# Patient Record
Sex: Male | Born: 1955
Health system: Southern US, Community
[De-identification: ages and names within clinical notes are randomized; demographics above are authoritative.]

## PROBLEM LIST (undated history)

## (undated) DIAGNOSIS — E1159 Type 2 diabetes mellitus with other circulatory complications: Secondary | ICD-10-CM

## (undated) DIAGNOSIS — F1910 Other psychoactive substance abuse, uncomplicated: Secondary | ICD-10-CM

## (undated) DIAGNOSIS — I10 Essential (primary) hypertension: Secondary | ICD-10-CM

## (undated) DIAGNOSIS — IMO0001 Reserved for inherently not codable concepts without codable children: Secondary | ICD-10-CM

## (undated) DIAGNOSIS — E119 Type 2 diabetes mellitus without complications: Secondary | ICD-10-CM

## (undated) DIAGNOSIS — B182 Chronic viral hepatitis C: Secondary | ICD-10-CM

## (undated) DIAGNOSIS — E669 Obesity, unspecified: Secondary | ICD-10-CM

## (undated) DIAGNOSIS — E1169 Type 2 diabetes mellitus with other specified complication: Secondary | ICD-10-CM

## (undated) DIAGNOSIS — I152 Hypertension secondary to endocrine disorders: Secondary | ICD-10-CM

## (undated) DIAGNOSIS — F419 Anxiety disorder, unspecified: Secondary | ICD-10-CM

## (undated) HISTORY — DX: Type 2 diabetes mellitus without complications: E11.9

## (undated) HISTORY — DX: Essential (primary) hypertension: I10

## (undated) HISTORY — DX: Hypertension secondary to endocrine disorders: I15.2

## (undated) HISTORY — DX: Type 2 diabetes mellitus with other specified complication: E11.69

## (undated) HISTORY — DX: Other psychoactive substance abuse, uncomplicated: F19.10

## (undated) HISTORY — DX: Chronic viral hepatitis C: B18.2

## (undated) HISTORY — DX: Reserved for inherently not codable concepts without codable children: IMO0001

## (undated) HISTORY — DX: Type 2 diabetes mellitus with other specified complication: E11.59

## (undated) HISTORY — DX: Anxiety disorder, unspecified: F41.9

## (undated) HISTORY — DX: Obesity, unspecified: E66.9

---

## 1998-07-01 ENCOUNTER — Encounter: Payer: Self-pay | Admitting: *Deleted

## 1998-07-01 ENCOUNTER — Inpatient Hospital Stay (HOSPITAL_COMMUNITY): Admission: EM | Admit: 1998-07-01 | Discharge: 1998-07-02 | Payer: Self-pay | Admitting: Emergency Medicine

## 2004-05-28 ENCOUNTER — Ambulatory Visit: Payer: Self-pay | Admitting: *Deleted

## 2004-07-23 ENCOUNTER — Ambulatory Visit: Payer: Self-pay | Admitting: Family Medicine

## 2004-09-03 ENCOUNTER — Ambulatory Visit: Payer: Self-pay | Admitting: Family Medicine

## 2004-09-07 ENCOUNTER — Ambulatory Visit: Payer: Self-pay | Admitting: Internal Medicine

## 2004-09-21 ENCOUNTER — Ambulatory Visit: Payer: Self-pay | Admitting: Family Medicine

## 2004-09-27 ENCOUNTER — Ambulatory Visit: Payer: Self-pay | Admitting: Internal Medicine

## 2004-10-01 ENCOUNTER — Ambulatory Visit: Payer: Self-pay | Admitting: Family Medicine

## 2004-11-13 ENCOUNTER — Ambulatory Visit: Payer: Self-pay | Admitting: Family Medicine

## 2005-05-01 ENCOUNTER — Ambulatory Visit: Payer: Self-pay | Admitting: Family Medicine

## 2005-05-03 ENCOUNTER — Ambulatory Visit: Payer: Self-pay | Admitting: Family Medicine

## 2005-05-24 ENCOUNTER — Ambulatory Visit: Payer: Self-pay | Admitting: Family Medicine

## 2005-07-09 ENCOUNTER — Ambulatory Visit: Payer: Self-pay | Admitting: Family Medicine

## 2005-07-12 ENCOUNTER — Ambulatory Visit: Payer: Self-pay | Admitting: Family Medicine

## 2005-09-16 ENCOUNTER — Ambulatory Visit: Payer: Self-pay | Admitting: Family Medicine

## 2006-01-30 ENCOUNTER — Ambulatory Visit: Payer: Self-pay | Admitting: Family Medicine

## 2006-08-08 ENCOUNTER — Emergency Department (HOSPITAL_COMMUNITY): Admission: EM | Admit: 2006-08-08 | Discharge: 2006-08-08 | Payer: Self-pay | Admitting: Emergency Medicine

## 2006-08-08 ENCOUNTER — Ambulatory Visit: Payer: Self-pay | Admitting: Family Medicine

## 2006-08-14 ENCOUNTER — Ambulatory Visit: Payer: Self-pay | Admitting: Family Medicine

## 2006-08-27 ENCOUNTER — Ambulatory Visit: Payer: Self-pay | Admitting: Family Medicine

## 2006-10-31 ENCOUNTER — Ambulatory Visit: Payer: Self-pay | Admitting: Family Medicine

## 2007-02-11 DIAGNOSIS — E785 Hyperlipidemia, unspecified: Secondary | ICD-10-CM | POA: Insufficient documentation

## 2007-02-11 DIAGNOSIS — F411 Generalized anxiety disorder: Secondary | ICD-10-CM | POA: Insufficient documentation

## 2007-02-11 DIAGNOSIS — I10 Essential (primary) hypertension: Secondary | ICD-10-CM | POA: Insufficient documentation

## 2007-02-11 DIAGNOSIS — F1010 Alcohol abuse, uncomplicated: Secondary | ICD-10-CM | POA: Insufficient documentation

## 2007-03-05 ENCOUNTER — Ambulatory Visit: Payer: Self-pay | Admitting: Family Medicine

## 2007-05-20 ENCOUNTER — Encounter (INDEPENDENT_AMBULATORY_CARE_PROVIDER_SITE_OTHER): Payer: Self-pay | Admitting: *Deleted

## 2009-12-27 ENCOUNTER — Encounter (INDEPENDENT_AMBULATORY_CARE_PROVIDER_SITE_OTHER): Payer: Self-pay | Admitting: Adult Health

## 2009-12-27 ENCOUNTER — Ambulatory Visit: Payer: Self-pay | Admitting: Family Medicine

## 2009-12-27 LAB — CONVERTED CEMR LAB
ALT: 47 units/L (ref 0–53)
AST: 32 units/L (ref 0–37)
Albumin: 4.2 g/dL (ref 3.5–5.2)
Alkaline Phosphatase: 49 units/L (ref 39–117)
BUN: 12 mg/dL (ref 6–23)
CO2: 23 meq/L (ref 19–32)
Calcium: 9.4 mg/dL (ref 8.4–10.5)
Chloride: 101 meq/L (ref 96–112)
Creatinine, Ser: 0.78 mg/dL (ref 0.40–1.50)
Glucose, Bld: 150 mg/dL — ABNORMAL HIGH (ref 70–99)
Microalb, Ur: 2.26 mg/dL — ABNORMAL HIGH (ref 0.00–1.89)
PSA: 0.66 ng/mL (ref 0.10–4.00)
Potassium: 3.9 meq/L (ref 3.5–5.3)
Sodium: 134 meq/L — ABNORMAL LOW (ref 135–145)
Total Bilirubin: 0.5 mg/dL (ref 0.3–1.2)
Total Protein: 7.3 g/dL (ref 6.0–8.3)

## 2010-02-19 ENCOUNTER — Encounter (INDEPENDENT_AMBULATORY_CARE_PROVIDER_SITE_OTHER): Payer: Self-pay | Admitting: Adult Health

## 2010-02-19 ENCOUNTER — Ambulatory Visit: Payer: Self-pay | Admitting: Internal Medicine

## 2010-02-19 LAB — CONVERTED CEMR LAB
ALT: 74 units/L — ABNORMAL HIGH (ref 0–53)
AST: 57 units/L — ABNORMAL HIGH (ref 0–37)
Albumin: 4.1 g/dL (ref 3.5–5.2)
Alkaline Phosphatase: 56 units/L (ref 39–117)
BUN: 13 mg/dL (ref 6–23)
CO2: 21 meq/L (ref 19–32)
Calcium: 9.5 mg/dL (ref 8.4–10.5)
Chloride: 102 meq/L (ref 96–112)
Cholesterol: 175 mg/dL (ref 0–200)
Creatinine, Ser: 0.83 mg/dL (ref 0.40–1.50)
Glucose, Bld: 126 mg/dL — ABNORMAL HIGH (ref 70–99)
HDL: 64 mg/dL (ref >39)
LDL Cholesterol: 97 mg/dL (ref 0–99)
Potassium: 4.1 meq/L (ref 3.5–5.3)
Sodium: 139 meq/L (ref 135–145)
Total Bilirubin: 0.6 mg/dL (ref 0.3–1.2)
Total CHOL/HDL Ratio: 2.7
Total Protein: 8 g/dL (ref 6.0–8.3)
Triglycerides: 72 mg/dL (ref <150)
VLDL: 14 mg/dL (ref 0–40)

## 2010-03-21 ENCOUNTER — Ambulatory Visit: Payer: Self-pay | Admitting: Internal Medicine

## 2010-07-03 ENCOUNTER — Encounter (INDEPENDENT_AMBULATORY_CARE_PROVIDER_SITE_OTHER): Payer: Self-pay | Admitting: *Deleted

## 2010-07-03 LAB — CONVERTED CEMR LAB
ALT: 84 units/L — ABNORMAL HIGH (ref 0–53)
AST: 58 units/L — ABNORMAL HIGH (ref 0–37)
Albumin: 4.3 g/dL (ref 3.5–5.2)
Alkaline Phosphatase: 53 units/L (ref 39–117)
BUN: 14 mg/dL (ref 6–23)
CO2: 25 meq/L (ref 19–32)
Calcium: 10.1 mg/dL (ref 8.4–10.5)
Chloride: 104 meq/L (ref 96–112)
Creatinine, Ser: 0.83 mg/dL (ref 0.40–1.50)
Glucose, Bld: 104 mg/dL — ABNORMAL HIGH (ref 70–99)
HCV Quantitative: 1170000 intl units/mL — ABNORMAL HIGH (ref <43)
Hgb A1c MFr Bld: 6.8 % — ABNORMAL HIGH (ref <5.7)
Potassium: 3.9 meq/L (ref 3.5–5.3)
Sodium: 139 meq/L (ref 135–145)
Total Bilirubin: 0.4 mg/dL (ref 0.3–1.2)
Total Protein: 7.8 g/dL (ref 6.0–8.3)

## 2012-12-22 ENCOUNTER — Telehealth: Payer: Self-pay | Admitting: Physician Assistant

## 2012-12-22 ENCOUNTER — Ambulatory Visit (INDEPENDENT_AMBULATORY_CARE_PROVIDER_SITE_OTHER): Payer: BC Managed Care – PPO | Admitting: Physician Assistant

## 2012-12-22 VITALS — BP 138/101 | HR 98 | Temp 98.6°F | Resp 18 | Ht 67.25 in | Wt 209.8 lb

## 2012-12-22 DIAGNOSIS — I10 Essential (primary) hypertension: Secondary | ICD-10-CM

## 2012-12-22 DIAGNOSIS — E119 Type 2 diabetes mellitus without complications: Secondary | ICD-10-CM

## 2012-12-22 LAB — POCT GLYCOSYLATED HEMOGLOBIN (HGB A1C): Hemoglobin A1C: 12.5

## 2012-12-22 LAB — GLUCOSE, POCT (MANUAL RESULT ENTRY): POC Glucose: 302 mg/dl — AB (ref 70–99)

## 2012-12-22 MED ORDER — METFORMIN HCL 1000 MG PO TABS: 1000.0000 mg | ORAL_TABLET | Freq: Two times a day (BID) | ORAL | Status: DC

## 2012-12-22 MED ORDER — GLIPIZIDE ER 10 MG PO TB24: 20.0000 mg | ORAL_TABLET | Freq: Every day | ORAL | Status: DC

## 2012-12-22 MED ORDER — AMLODIPINE BESYLATE 5 MG PO TABS: 5.0000 mg | ORAL_TABLET | Freq: Every day | ORAL | Status: DC

## 2012-12-22 MED ORDER — LOSARTAN POTASSIUM-HCTZ 100-25 MG PO TABS: 1.0000 | ORAL_TABLET | Freq: Every day | ORAL | Status: DC

## 2012-12-22 NOTE — Telephone Encounter (Signed)
Pt was seen here this evening, but I forgot to ask if pt needed DM testing supplies.  Pt needs to resume checking home blood sugars.  Please call pt and see if he needs [place] to send more testing supplies for him.  Thank you!

## 2012-12-22 NOTE — Progress Notes (Signed)
Subjective:    Patient ID: Todd Greer, male    DOB: 1956-02-12, 57 y.o.   MRN: [id number]  HPI   Todd Greer is a pleasant 57 yr old male here requesting medication refills.  He was previously a patient at [facility] but would like to establish care here.  This is his first visit here.  He has been out of medication for approximately 2 months now.  DM - has been treated for about 15 yrs, states he was previously well controlled on oral medication only.  Was checking sugars about daily but has been out of test strips.  Previously was seeing FBS 120s-160s.  Thinks his last A1C was 6.2 or 6.3 in June 2013.    HTN - thinks he has been treated for about 15 yrs, checks BP at work, today was very elevated.  At triage BP 138/101. He denies symptoms including HA, CP, SOB.   Review of Systems  Constitutional: Negative for fever and chills.  HENT: Negative.   Respiratory: Negative.   Cardiovascular: Negative.   Gastrointestinal: Negative.   Musculoskeletal: Negative.   Skin: Negative.   Neurological: Negative.        Objective:   Physical Exam  Vitals reviewed. Constitutional: He is oriented to person, place, and time. He appears well-developed and well-nourished. No distress.  HENT:  Head: Normocephalic and atraumatic.  Eyes: Conjunctivae are normal. No scleral icterus.  Cardiovascular: Normal rate, regular rhythm and normal heart sounds.  Exam reveals no gallop and no friction rub.   No murmur heard. Pulses:      Dorsalis pedis pulses are 2+ on the right side, and 2+ on the left side.       Posterior tibial pulses are 2+ on the right side, and 2+ on the left side.  Pulmonary/Chest: Effort normal and breath sounds normal. He has no wheezes. He has no rales.  Abdominal: Soft. There is no tenderness.  Musculoskeletal:  DM foot exam performed, normal  Neurological: He is alert and oriented to person, place, and time.  Skin: Skin is warm and dry.  Psychiatric: He has a normal mood and  affect. His behavior is normal.     Filed Vitals:   12/22/12 1916  BP: 138/101  Pulse: 98  Temp: 98.6 F (37 C)  Resp: 18     Results for orders placed in visit on 12/22/12  POCT GLYCOSYLATED HEMOGLOBIN (HGB A1C)      Result Value Range   Hemoglobin A1C 12.5    GLUCOSE, POCT (MANUAL RESULT ENTRY)      Result Value Range   POC Glucose 302 (*) 70 - 99 mg/dl       Assessment & Plan:  Diabetes - Plan: POCT glycosylated hemoglobin (Hb A1C), POCT glucose (manual entry), glipiZIDE (GLUCOTROL XL) 10 MG 24 hr tablet, metFORMIN (GLUCOPHAGE) 1000 MG tablet  -- Pt with 15 yr hx DM previously well controlled on oral medications.  Previously a pt of [facility], now would like to establish here.  He has been out of medication x 2 months now.  BG 302 and A1C 12.5.  Will resume medications that pt was previously taking.  Encouraged him to return for CPE sometime within the next 3 months to establish care and f/u on DM.  Printed edu materials regarding healthy dietary choices.    Hypertension - Plan: Basic metabolic panel, amLODipine (NORVASC) 5 MG tablet, losartan-hydrochlorothiazide (HYZAAR) 100-25 MG per tablet  -- Pt also with 15 yr hx of HTN.  Off meds now for 2 months.  Asymtomatic.  BMP pending.  Will resume amlodipine and losartan/hctz.  Pt will continue monitoring home BPs.    Discussed the importance of returning for CPE to establish care.  Pt expresses understanding and will RTC sooner if new concerns arise.

## 2012-12-22 NOTE — Patient Instructions (Addendum)
Resume taking your medications as directed.  Make sure you are making healthy dietary choices as well as getting 150 minutes of exercise per week.  Schedule (or walk-in) for a complete physical in the next 3 months so we can make sure you are up to date on health maintenance things and follow up on your diabetes.  Certainly come in sooner if you have concerns.   Diabetes Meal Planning Guide The diabetes meal planning guide is a tool to help you plan your meals and snacks. It is important for people with diabetes to manage their blood glucose (sugar) levels. Choosing the right foods and the right amounts throughout your day will help control your blood glucose. Eating right can even help you improve your blood pressure and reach or maintain a healthy weight. CARBOHYDRATE COUNTING MADE EASY When you eat carbohydrates, they turn to sugar. This raises your blood glucose level. Counting carbohydrates can help you control this level so you feel better. When you plan your meals by counting carbohydrates, you can have more flexibility in what you eat and balance your medicine with your food intake. Carbohydrate counting simply means adding up the total amount of carbohydrate grams in your meals and snacks. Try to eat about the same amount at each meal. Foods with carbohydrates are listed below. Each portion below is 1 carbohydrate serving or 15 grams of carbohydrates. Ask your dietician how many grams of carbohydrates you should eat at each meal or snack. Grains and Starches  1 slice bread.   English muffin or hotdog/hamburger bun.   cup cold cereal (unsweetened).   cup cooked pasta or rice.   cup starchy vegetables (corn, potatoes, peas, beans, winter squash).  1 tortilla (6 inches).   bagel.  1 waffle or pancake (size of a CD).   cup cooked cereal.  4 to 6 small crackers. *Whole grain is recommended. Fruit  1 cup fresh unsweetened berries, melon, papaya, pineapple.  1 small fresh  fruit.   banana or mango.   cup fruit juice (4 oz unsweetened).   cup canned fruit in natural juice or water.  2 tbs dried fruit.  12 to 15 grapes or cherries. Milk and Yogurt  1 cup fat-free or 1% milk.  1 cup soy milk.  6 oz light yogurt with sugar-free sweetener.  6 oz low-fat soy yogurt.  6 oz plain yogurt. Vegetables  1 cup raw or  cup cooked is counted as 0 carbohydrates or a "free" food.  If you eat 3 or more servings at 1 meal, count them as 1 carbohydrate serving. Other Carbohydrates   oz chips or pretzels.   cup ice cream or frozen yogurt.   cup sherbet or sorbet.  2 inch square cake, no frosting.  1 tbs honey, sugar, jam, jelly, or syrup.  2 small cookies.  3 squares of graham crackers.  3 cups popcorn.  6 crackers.  1 cup broth-based soup.  Count 1 cup casserole or other mixed foods as 2 carbohydrate servings.  Foods with less than 20 calories in a serving may be counted as 0 carbohydrates or a "free" food. You may want to purchase a book or computer software that lists the carbohydrate gram counts of different foods. In addition, the nutrition facts panel on the labels of the foods you eat are a good source of this information. The label will tell you how big the serving size is and the total number of carbohydrate grams you will be eating per serving.  Divide this number by 15 to obtain the number of carbohydrate servings in a portion. Remember, 1 carbohydrate serving equals 15 grams of carbohydrate. SERVING SIZES Measuring foods and serving sizes helps you make sure you are getting the right amount of food. The list below tells how big or small some common serving sizes are.  1 oz.........4 stacked dice.  3 oz........Marland KitchenDeck of cards.  1 tsp.......Marland KitchenTip of little finger.  1 tbs......Marland KitchenMarland KitchenThumb.  2 tbs.......Marland KitchenGolf ball.   cup......Marland KitchenHalf of a fist.  1 cup.......Marland KitchenA fist. SAMPLE DIABETES MEAL PLAN Below is a sample meal plan that  includes foods from the grain and starches, dairy, vegetable, fruit, and meat groups. A dietician can individualize a meal plan to fit your calorie needs and tell you the number of servings needed from each food group. However, controlling the total amount of carbohydrates in your meal or snack is more important than making sure you include all of the food groups at every meal. You may interchange carbohydrate containing foods (dairy, starches, and fruits). The meal plan below is an example of a 2000 calorie diet using carbohydrate counting. This meal plan has 17 carbohydrate servings. Breakfast  1 cup oatmeal (2 carb servings).   cup light yogurt (1 carb serving).  1 cup blueberries (1 carb serving).   cup almonds. Snack  1 large apple (2 carb servings).  1 low-fat string cheese stick. Lunch  Chicken breast salad.  1 cup spinach.   cup chopped tomatoes.  2 oz chicken breast, sliced.  2 tbs low-fat [place] dressing.  12 whole-wheat crackers (2 carb servings).  12 to 15 grapes (1 carb serving).  1 cup low-fat milk (1 carb serving). Snack  1 cup carrots.   cup hummus (1 carb serving). Dinner  3 oz broiled salmon.  1 cup brown rice (3 carb servings). Snack  1  cups steamed broccoli (1 carb serving) drizzled with 1 tsp olive oil and lemon juice.  1 cup light pudding (2 carb servings). DIABETES MEAL PLANNING WORKSHEET Your dietician can use this worksheet to help you decide how many servings of foods and what types of foods are right for you.  BREAKFAST Food Group and Servings / Carb Servings Grain/Starches __________________________________ Dairy __________________________________________ Vegetable ______________________________________ Fruit ___________________________________________ Meat __________________________________________ Fat ____________________________________________ LUNCH Food Group and Servings / Carb Servings Grain/Starches  ___________________________________ Dairy ___________________________________________ Fruit ____________________________________________ Meat ___________________________________________ Fat _____________________________________________ Laural [place] Food Group and Servings / Carb Servings Grain/Starches ___________________________________ Dairy ___________________________________________ Fruit ____________________________________________ Meat ___________________________________________ Fat _____________________________________________ SNACKS Food Group and Servings / Carb Servings Grain/Starches ___________________________________ Dairy ___________________________________________ Vegetable _______________________________________ Fruit ____________________________________________ Meat ___________________________________________ Fat _____________________________________________ DAILY TOTALS Starches _________________________ Vegetable ________________________ Fruit ____________________________ Dairy ____________________________ Meat ____________________________ Fat ______________________________ Document Released: 05/16/2005 Document Revised: 11/11/2011 Document Reviewed: 03/27/2009 Exit[employer] Patient Information 2013 [place], [place].

## 2012-12-23 ENCOUNTER — Telehealth: Payer: Self-pay

## 2012-12-23 LAB — BASIC METABOLIC PANEL
BUN: 17 mg/dL (ref 6–23)
CO2: 29 mEq/L (ref 19–32)
Calcium: 10.1 mg/dL (ref 8.4–10.5)
Chloride: 96 mEq/L (ref 96–112)
Creat: 1.04 mg/dL (ref 0.50–1.35)
Glucose, Bld: 305 mg/dL — ABNORMAL HIGH (ref 70–99)
Potassium: 4.4 mEq/L (ref 3.5–5.3)
Sodium: 135 mEq/L (ref 135–145)

## 2012-12-23 MED ORDER — BLOOD GLUCOSE MONITORING SUPPL SUPPLIES MISC: Status: DC

## 2012-12-23 NOTE — Telephone Encounter (Signed)
Spoke to patient and let him know that testing supplies were sent to pharmacy.

## 2012-12-23 NOTE — Telephone Encounter (Signed)
Sent to pharmacy 

## 2012-12-23 NOTE — Telephone Encounter (Signed)
Spoke to patient's wife- he does need testing supplies. Please send to [facility] pharmacy listed.

## 2012-12-23 NOTE — Telephone Encounter (Signed)
Pt is calling about lab results Call back number is [phone]

## 2012-12-23 NOTE — Telephone Encounter (Signed)
Spoke with patient and gave him his lab results. 

## 2013-01-20 ENCOUNTER — Other Ambulatory Visit: Payer: Self-pay

## 2013-01-20 DIAGNOSIS — I10 Essential (primary) hypertension: Secondary | ICD-10-CM

## 2013-01-20 DIAGNOSIS — E119 Type 2 diabetes mellitus without complications: Secondary | ICD-10-CM

## 2013-01-20 MED ORDER — METFORMIN HCL 1000 MG PO TABS: 1000.0000 mg | ORAL_TABLET | Freq: Two times a day (BID) | ORAL | Status: DC

## 2013-01-20 MED ORDER — GLIPIZIDE ER 10 MG PO TB24: 20.0000 mg | ORAL_TABLET | Freq: Every day | ORAL | Status: DC

## 2013-01-20 MED ORDER — AMLODIPINE BESYLATE 5 MG PO TABS: 5.0000 mg | ORAL_TABLET | Freq: Every day | ORAL | Status: DC

## 2013-01-20 MED ORDER — LOSARTAN POTASSIUM-HCTZ 100-25 MG PO TABS: 1.0000 | ORAL_TABLET | Freq: Every day | ORAL | Status: DC

## 2013-02-25 ENCOUNTER — Encounter: Payer: Self-pay | Admitting: Family Medicine

## 2013-02-25 ENCOUNTER — Ambulatory Visit (INDEPENDENT_AMBULATORY_CARE_PROVIDER_SITE_OTHER): Payer: BC Managed Care – PPO | Admitting: Family Medicine

## 2013-02-25 VITALS — BP 120/84 | HR 82 | Temp 98.6°F | Resp 16 | Ht 66.5 in | Wt 217.0 lb

## 2013-02-25 DIAGNOSIS — E119 Type 2 diabetes mellitus without complications: Secondary | ICD-10-CM

## 2013-02-25 DIAGNOSIS — IMO0001 Reserved for inherently not codable concepts without codable children: Secondary | ICD-10-CM

## 2013-02-25 DIAGNOSIS — I10 Essential (primary) hypertension: Secondary | ICD-10-CM

## 2013-02-25 DIAGNOSIS — Z760 Encounter for issue of repeat prescription: Secondary | ICD-10-CM

## 2013-02-25 DIAGNOSIS — Z23 Encounter for immunization: Secondary | ICD-10-CM

## 2013-02-25 LAB — LIPID PANEL
Cholesterol: 169 mg/dL (ref 0–200)
HDL: 63 mg/dL (ref >39)
LDL Cholesterol: 92 mg/dL (ref 0–99)
Total CHOL/HDL Ratio: 2.7 Ratio
Triglycerides: 69 mg/dL (ref <150)
VLDL: 14 mg/dL (ref 0–40)

## 2013-02-25 LAB — POCT GLYCOSYLATED HEMOGLOBIN (HGB A1C): Hemoglobin A1C: 8.1

## 2013-02-25 LAB — TSH: TSH: 2.339 u[IU]/mL (ref 0.350–4.500)

## 2013-02-25 MED ORDER — GLIPIZIDE ER 10 MG PO TB24: 20.0000 mg | ORAL_TABLET | Freq: Every day | ORAL | Status: DC

## 2013-02-25 MED ORDER — AMLODIPINE BESYLATE 5 MG PO TABS: 5.0000 mg | ORAL_TABLET | Freq: Every day | ORAL | Status: DC

## 2013-02-25 MED ORDER — METFORMIN HCL 1000 MG PO TABS: 1000.0000 mg | ORAL_TABLET | Freq: Two times a day (BID) | ORAL | Status: DC

## 2013-02-25 MED ORDER — LOSARTAN POTASSIUM-HCTZ 100-25 MG PO TABS: 1.0000 | ORAL_TABLET | Freq: Every day | ORAL | Status: DC

## 2013-02-25 NOTE — Progress Notes (Signed)
S:  This 57 y.o. AA male is new to [facility]; he was seen at 102 inApril at which time medications were resumed and A1c= 12.5%. Previous healthcare was ta [facility]. FSBS at home= mid-100s to 200. Pt denies hypoglycemia since last visit though he has experienced clammy feeling; he drinks O.J. when that occurs. He is compliant w/ medications. He is not meal planning but tries to avoid conc sweets and sugary drinks. He works full time as a Equities trader.  Patient Active Problem List   Diagnosis Date Noted  . HEPATITIS C 02/11/2007  . DM 02/11/2007  . DYSLIPIDEMIA 02/11/2007  . ANXIETY DISORDER 02/11/2007  . ALCOHOL USE 02/11/2007  . HYPERTENSION, ESSENTIAL NOS 02/11/2007   PMHx, Soc Hx and Fam Hx reviewed.  ROS: As per HPI. Negative for appetite or activity changes, abnormal weight loss, fatigue, CP or tightness, palpitations, abd pain or GI upset, myalgias/ arthralgias, HA, dizziness, weakness, numbness or syncope.  O"  Filed Vitals:   02/25/13 1014  BP: 120/84  Pulse: 82  Temp: 98.6 F (37 C)  Resp: 16   GEN: In NAD; WN,WD. HENT: [place]/AT; EOMI w/ clear conj and muddy sclerae. Otherwise normal. COR: RRR; no edema. LUNGS: Normal resp rate and effort. SKIN: W&D; no rashes. See DM foot check. Foot exam- no deformities, ulcerations, corns or callouses; pulses 2+/=. NEURO: A&O x 3; CNs intact.   Results for orders placed in visit on 02/25/13  POCT GLYCOSYLATED HEMOGLOBIN (HGB A1C)      Result Value Range   Hemoglobin A1C 8.1      A/P: Hypertension - Stable and controlled on current medication.       Plan: amLODipine (NORVASC) 5 MG tablet, losartan-hydrochlorothiazide (HYZAAR) 100-25 MG per tablet, TSH  Diabetes - Much improved with medication compliance and improved nutrition.  Plan: glipiZIDE (GLUCOTROL XL) 10 MG 24 hr tablet, metFORMIN (GLUCOPHAGE) 1000 MG tablet, POCT glycosylated hemoglobin (Hb A1C), Lipid panel  Need for prophylactic vaccination with combined  diphtheria-tetanus-pertussis (DTP) vaccine - Plan: Tdap vaccine greater than or equal to 7yo IM  Meds ordered this encounter  Medications  . amLODipine (NORVASC) 5 MG tablet    Sig: Take 1 tablet (5 mg total) by mouth daily.    Dispense:  90 tablet    Refill:  1            . glipiZIDE (GLUCOTROL XL) 10 MG 24 hr tablet    Sig: Take 2 tablets (20 mg total) by mouth daily.    Dispense:  180 tablet    Refill:  1            . metFORMIN (GLUCOPHAGE) 1000 MG tablet    Sig: Take 1 tablet (1,000 mg total) by mouth 2 (two) times daily with a meal.    Dispense:  180 tablet    Refill:  1            . losartan-hydrochlorothiazide (HYZAAR) 100-25 MG per tablet    Sig: Take 1 tablet by mouth daily.    Dispense:  90 tablet    Refill:  1              RTC in 3 months for CPE.

## 2013-02-27 ENCOUNTER — Encounter: Payer: Self-pay | Admitting: Family Medicine

## 2013-03-30 ENCOUNTER — Ambulatory Visit (INDEPENDENT_AMBULATORY_CARE_PROVIDER_SITE_OTHER): Payer: BC Managed Care – PPO | Admitting: Physician Assistant

## 2013-03-30 VITALS — BP 150/90 | HR 107 | Temp 98.2°F | Resp 18 | Ht 67.0 in | Wt 221.0 lb

## 2013-03-30 DIAGNOSIS — E119 Type 2 diabetes mellitus without complications: Secondary | ICD-10-CM

## 2013-03-30 LAB — GLUCOSE, POCT (MANUAL RESULT ENTRY): POC Glucose: 195 mg/dl — AB (ref 70–99)

## 2013-03-30 NOTE — Progress Notes (Signed)
  Subjective:    Patient ID: Todd Greer, male    DOB: Aug 17, 1956, 57 y.o.   MRN: [id number]  HPI 57 y.o. Male presents to clinic today with concerns about taking too much of his Glipizide.This evening he states that he was in a rush and accidentally took 2 Glipizide this evening about 6:45 pm with dinner. Pt usually takes 2 tablets of Glipizide in the morning along with 1 metformin. He is concerned that his sugar will drop during the night.   Review of Systems  Constitutional: Negative for fever and chills.  Eyes: Negative for visual disturbance.  Respiratory: Negative for chest tightness and shortness of breath.   Cardiovascular: Negative for chest pain.  Gastrointestinal: Negative for nausea, vomiting and abdominal pain.  Genitourinary: Negative for difficulty urinating.  Neurological: Negative for dizziness, facial asymmetry and headaches.  All other systems reviewed and are negative.       Objective:   Physical Exam  Nursing note and vitals reviewed. Constitutional: He is oriented to person, place, and time. He appears well-developed and well-nourished. No distress.  HENT:  Head: Normocephalic and atraumatic.  Right Ear: External ear normal.  Left Ear: External ear normal.  Nose: Nose normal.  Eyes: Conjunctivae are normal.  Neck: Normal range of motion. Neck supple.  Cardiovascular: Normal rate, regular rhythm and normal heart sounds.   Pulmonary/Chest: Effort normal and breath sounds normal.  Musculoskeletal: Normal range of motion.  Neurological: He is alert and oriented to person, place, and time.  Skin: Skin is warm and dry.  Psychiatric: He has a normal mood and affect. His behavior is normal. Judgment and thought content normal.      Results for orders placed in visit on 03/30/13  GLUCOSE, POCT (MANUAL RESULT ENTRY)      Result Value Range   POC Glucose 195 (*) 70 - 99 mg/dl       Assessment & Plan:  Diabetes - Plan: POCT glucose (manual entry).  Patient  took too much of his Glipizide medication this evening. Instructed patient to eat a snack with both carbs and protein before he goes to sleep tonight like peanut butter and crackers. He should check his blood sugar between 11-11:30 before he goes to sleep. Suggested he set alarm for about every 2 hours over night to check his sugar. Told him that If he notices his sugar dropping below ~120 he should eat another small snack. Advised him to have a family member keep an eye out if he starts acting differently. Instructed him that he may resume normal medication in the morning. If he has any concerns or questions told him to please call

## 2013-03-30 NOTE — Patient Instructions (Signed)
Eat a snack with both carbs and protein before you go to sleep tonight like peanut butter and crackers Check you blood sugar between 11-11:30 before you go to sleep Set alarm for about every 2 hours over night to check your sugar If you notice it go below ~120 eat another small snack Have a family member keep an eye out if you start acting differently  You may resume normal medication in the morning If you have any questions please call

## 2013-03-30 NOTE — Progress Notes (Signed)
I have examined this patient along with the student and agree.  

## 2013-05-28 ENCOUNTER — Encounter: Payer: Self-pay | Admitting: Family Medicine

## 2013-05-28 ENCOUNTER — Ambulatory Visit (INDEPENDENT_AMBULATORY_CARE_PROVIDER_SITE_OTHER): Payer: BC Managed Care – PPO | Admitting: Family Medicine

## 2013-05-28 VITALS — BP 120/80 | HR 85 | Temp 99.3°F | Resp 16 | Ht 66.0 in | Wt 221.0 lb

## 2013-05-28 DIAGNOSIS — E119 Type 2 diabetes mellitus without complications: Secondary | ICD-10-CM | POA: Insufficient documentation

## 2013-05-28 DIAGNOSIS — B1710 Acute hepatitis C without hepatic coma: Secondary | ICD-10-CM

## 2013-05-28 DIAGNOSIS — Z23 Encounter for immunization: Secondary | ICD-10-CM

## 2013-05-28 DIAGNOSIS — R9431 Abnormal electrocardiogram [ECG] [EKG]: Secondary | ICD-10-CM

## 2013-05-28 DIAGNOSIS — Z0000 Encounter for general adult medical examination without abnormal findings: Secondary | ICD-10-CM

## 2013-05-28 DIAGNOSIS — IMO0001 Reserved for inherently not codable concepts without codable children: Secondary | ICD-10-CM

## 2013-05-28 DIAGNOSIS — N400 Enlarged prostate without lower urinary tract symptoms: Secondary | ICD-10-CM

## 2013-05-28 DIAGNOSIS — Z1211 Encounter for screening for malignant neoplasm of colon: Secondary | ICD-10-CM

## 2013-05-28 LAB — IFOBT (OCCULT BLOOD): IFOBT: NEGATIVE

## 2013-05-28 LAB — POCT GLYCOSYLATED HEMOGLOBIN (HGB A1C): Hemoglobin A1C: 7.4

## 2013-05-28 LAB — COMPREHENSIVE METABOLIC PANEL
ALT: 87 U/L — ABNORMAL HIGH (ref 0–53)
AST: 66 U/L — ABNORMAL HIGH (ref 0–37)
Albumin: 4.1 g/dL (ref 3.5–5.2)
Alkaline Phosphatase: 56 U/L (ref 39–117)
BUN: 12 mg/dL (ref 6–23)
CO2: 22 mEq/L (ref 19–32)
Calcium: 9.8 mg/dL (ref 8.4–10.5)
Chloride: 103 mEq/L (ref 96–112)
Creat: 0.77 mg/dL (ref 0.50–1.35)
Glucose, Bld: 96 mg/dL (ref 70–99)
Potassium: 3.7 mEq/L (ref 3.5–5.3)
Sodium: 136 mEq/L (ref 135–145)
Total Bilirubin: 0.7 mg/dL (ref 0.3–1.2)
Total Protein: 7.5 g/dL (ref 6.0–8.3)

## 2013-05-28 LAB — POCT URINALYSIS DIPSTICK
Bilirubin, UA: NEGATIVE
Blood, UA: NEGATIVE
Glucose, UA: NEGATIVE
Ketones, UA: NEGATIVE
Leukocytes, UA: NEGATIVE
Nitrite, UA: NEGATIVE
Protein, UA: NEGATIVE
Spec Grav, UA: 1.025
Urobilinogen, UA: 1
pH, UA: 5.5

## 2013-05-28 MED ORDER — TAMSULOSIN HCL 0.4 MG PO CAPS: ORAL_CAPSULE | ORAL | Status: DC

## 2013-05-28 NOTE — Progress Notes (Signed)
Subjective:    Patient ID: Todd Greer, male    DOB: [birth year], 57 y.o.   MRN: [id number]  HPI  This 57 y.o. AA male is here for CPE. He has Type II DM, HTN and Hepatitis C (never referred to a specialist for complete evaluation). FSBS= 100-200; he checks every other day. He reports some SOB and chest tightness with exercise, onset within last few months.  PMHx, Soc Hx, Fam Hx and Problem List reviewed. Medications reconciled.   Review of Systems  Constitutional: Positive for fatigue.  HENT: Negative.   Eyes: Negative.   Respiratory: Positive for shortness of breath.   Cardiovascular: Negative.   Gastrointestinal: Negative.   Endocrine: Negative.   Genitourinary: Positive for urgency and difficulty urinating. Negative for dysuria, hematuria and flank pain.       C/o decreased stream and nocturia.  Musculoskeletal: Positive for arthralgias.  Skin: Negative.   Allergic/Immunologic: Negative.   Neurological: Negative.   Hematological: Negative.   Psychiatric/Behavioral: Negative.        Objective:   Physical Exam  Constitutional: He is oriented to person, place, and time. Vital signs are normal. He appears well-developed and well-nourished. No distress.  HENT:  Head: Normocephalic and atraumatic.  Right Ear: Hearing, external ear and ear canal normal. Tympanic membrane is scarred. Tympanic membrane is not erythematous. No middle ear effusion.  Left Ear: Hearing, external ear and ear canal normal. Tympanic membrane is scarred. Tympanic membrane is not erythematous.  No middle ear effusion.  Nose: Nose normal. No nasal deformity or septal deviation.  Mouth/Throat: Uvula is midline, oropharynx is clear and moist and mucous membranes are normal. He has dentures. No oral lesions.  Eyes: Conjunctivae, EOM and lids are normal. Pupils are equal, round, and reactive to light. No scleral icterus.  Fundoscopic exam:      The right eye shows no papilledema. The right eye shows red reflex.        The left eye shows no papilledema. The left eye shows red reflex.  Fundoscopic exam difficult.  Neck: Normal range of motion and full passive range of motion without pain. Neck supple. No JVD present. No spinous process tenderness and no muscular tenderness present. No mass and no thyromegaly present.  Cardiovascular: Normal rate, regular rhythm, S1 normal, S2 normal and normal pulses.   Occasional extrasystoles are present. PMI is not displaced.  Exam reveals no gallop, no distant heart sounds and no friction rub.   No murmur heard. Pulmonary/Chest: Effort normal and breath sounds normal. No respiratory distress.  Abdominal: Soft. Normal appearance and bowel sounds are normal. He exhibits no distension, no pulsatile midline mass and no mass. There is no hepatosplenomegaly. There is no tenderness. There is no guarding and no CVA tenderness. No hernia.  Genitourinary: Rectal exam shows external hemorrhoid. Rectal exam shows no fissure, no mass, no tenderness and anal tone normal. Guaiac negative stool. Prostate is enlarged. Prostate is not tender.  Musculoskeletal: Normal range of motion. He exhibits no edema and no tenderness.  Neurological: He is alert and oriented to person, place, and time. He has normal reflexes. He displays no atrophy and no tremor. No cranial nerve deficit or sensory deficit. He exhibits normal muscle tone. Coordination and gait normal.  Skin: Skin is warm, dry and intact. No lesion and no rash noted. He is not diaphoretic. No cyanosis. Nails show no clubbing.  Psychiatric: His speech is normal and behavior is normal. Judgment and thought content normal. Cognition and memory are normal.  Results for orders placed in visit on 05/28/13  POCT URINALYSIS DIPSTICK      Result Value Range   Color, UA yellow     Clarity, UA clear     Glucose, UA neg     Bilirubin, UA neg     Ketones, UA neg     Spec Grav, UA 1.025     Blood, UA neg     pH, UA 5.5     Protein, UA neg      Urobilinogen, UA 1.0     Nitrite, UA neg     Leukocytes, UA Negative    IFOBT (OCCULT BLOOD)      Result Value Range   IFOBT Negative    POCT GLYCOSYLATED HEMOGLOBIN (HGB A1C)      Result Value Range   Hemoglobin A1C 7.4      ECG: Sinus rhythm w/ PVCs; RBBB w/ left anterior fascicular block.      Assessment & Plan:  Routine general medical examination at a health care facility - Plan: POCT urinalysis dipstick, IFOBT POC (occult bld, rslt in office), POCT glycosylated hemoglobin (Hb A1C), PSA, Vit D  25 hydroxy (rtn osteoporosis monitoring), Microalbumin, urine, Comprehensive metabolic panel, EKG 12-Lead, CANCELED: Basic metabolic panel  Type II or unspecified type diabetes mellitus without mention of complication, uncontrolled  Prostatism- Advised pt Urology referral may be needed if PSA elevated. Trial Flomax.  HEPATITIS C - Plan: Ambulatory referral to Gastroenterology, Hepatitis C Genotype  Nonspecific abnormal electrocardiogram (ECG) (EKG) - Plan: Ambulatory referral to Cardiology  Need for prophylactic vaccination against Streptococcus pneumoniae (pneumococcus) - Plan: Pneumococcal polysaccharide vaccine 23-valent greater than or equal to 2yo subcutaneous/IM  Need for prophylactic vaccination and inoculation against influenza - Plan: Flu Vaccine QUAD 36+ mos IM  Screening for colorectal cancer - Plan: Ambulatory referral to Gastroenterology  Meds ordered this encounter  Medications  . tamsulosin (FLOMAX) 0.4 MG CAPS capsule    Sig: Take 1 tablet by mouth daily to help bladder symptoms.    Dispense:  30 capsule    Refill:  3

## 2013-05-28 NOTE — Patient Instructions (Addendum)
I have referred you to a GI specialist for colonoscopy and to do further evaluation for Hepatitis C.  You also have an abnormal finding on ECG and I want you to see a Cardiologist.  You will be called about these appointments once they are scheduled.

## 2013-05-29 LAB — MICROALBUMIN, URINE: Microalb, Ur: 2.59 mg/dL — ABNORMAL HIGH (ref 0.00–1.89)

## 2013-05-29 LAB — PSA: PSA: 0.8 ng/mL (ref <=4.00)

## 2013-05-29 LAB — VITAMIN D 25 HYDROXY (VIT D DEFICIENCY, FRACTURES): Vit D, 25-Hydroxy: 38 ng/mL (ref 30–89)

## 2013-06-01 ENCOUNTER — Encounter: Payer: Self-pay | Admitting: Cardiology

## 2013-06-01 ENCOUNTER — Telehealth: Payer: Self-pay | Admitting: Radiology

## 2013-06-01 ENCOUNTER — Ambulatory Visit (INDEPENDENT_AMBULATORY_CARE_PROVIDER_SITE_OTHER): Payer: BC Managed Care – PPO | Admitting: Cardiology

## 2013-06-01 ENCOUNTER — Encounter (HOSPITAL_COMMUNITY): Payer: Self-pay | Admitting: *Deleted

## 2013-06-01 VITALS — BP 144/80 | HR 98 | Ht 67.0 in | Wt 223.8 lb

## 2013-06-01 DIAGNOSIS — R0609 Other forms of dyspnea: Secondary | ICD-10-CM

## 2013-06-01 DIAGNOSIS — R0602 Shortness of breath: Secondary | ICD-10-CM | POA: Insufficient documentation

## 2013-06-01 DIAGNOSIS — R9431 Abnormal electrocardiogram [ECG] [EKG]: Secondary | ICD-10-CM

## 2013-06-01 DIAGNOSIS — R0989 Other specified symptoms and signs involving the circulatory and respiratory systems: Secondary | ICD-10-CM

## 2013-06-01 DIAGNOSIS — E785 Hyperlipidemia, unspecified: Secondary | ICD-10-CM

## 2013-06-01 DIAGNOSIS — R0600 Dyspnea, unspecified: Secondary | ICD-10-CM

## 2013-06-01 DIAGNOSIS — E669 Obesity, unspecified: Secondary | ICD-10-CM | POA: Insufficient documentation

## 2013-06-01 DIAGNOSIS — I10 Essential (primary) hypertension: Secondary | ICD-10-CM

## 2013-06-01 LAB — HEPATITIS C RNA QUANTITATIVE
HCV Quantitative Log: 5.88 {Log} — ABNORMAL HIGH (ref <1.18)
HCV Quantitative: 754000 IU/mL — ABNORMAL HIGH (ref <15)

## 2013-06-01 NOTE — Assessment & Plan Note (Signed)
Bifascicular block with PVCs -- with his cardiac risk factors and symptoms, this is very concerning for potential ischemic changes.  Plan: Exercise Myoview

## 2013-06-01 NOTE — Assessment & Plan Note (Signed)
He was in the process of trying to increase his exercise, and that's when he started noticing his dyspnea.  The hope is that we can determine if there is any cardiac etiology associated with his dyspnea.  Otherwise I would like her to get back to his exercise routine with the addition of some or cardiovascular type exercise.  He also can't be need for dietary modification especially with his diabetes.

## 2013-06-01 NOTE — Telephone Encounter (Signed)
Call from [facility], they need EKG, was going to have it scanned, but apparently our scanning is not functioning properly today. We have put in a ticket for repair. I have faxed the report to [facility]

## 2013-06-01 NOTE — Progress Notes (Signed)
PCP: Dow Adolph, MD  Clinic Note: Chief Complaint  Patient presents with  . Establish Care    Abnormal EKG per Dr Audria Nine. Shortness of breath at rest, fatigue, legs get tired easily, lightheadedness when standing too quickly.  . Labs    Done on Friday    HPI: Todd Greer is a 57 y.o. male with a PMH below who presents today for evaluation of an abnormal ECG just performed in order to evaluate shortness of breath with exertion.  He has cardiac risk factors noted his past medical history.  Interval History: He was seen 4 days ago by his primary physician with complaints of exertional shortness of breath with some chest tightness.  This is going on for about a month now.  He used to drink energy drinks to help him with his exercises in an attempt to try to push through his fatigue.  At her, when this was a long helpful, he did note that he was feeling tired and short of breath the lesion he had in the past.  When he gets really short of breath he does note a little bit tightness in his chest but it is difficult for him to breath.  He denies having any of these symptoms at rest.  He does note occasional indigestion type symptoms when he is lying down and occasionally during his episodes of having a hard time breathing.  He describes this as a burning sensation in his epigastric area.  He just also notes he feels fatigued a lot easier.  The remainder of Cardiovascular ROS: negative for - edema, irregular heartbeat, loss of consciousness, murmur, orthopnea, palpitations, paroxysmal nocturnal dyspnea or rapid heart rate: He does have occasional PND orthopnea, but not routine Additional cardiac review of systems: Lightheadedness -yes when he feels short of breath and when he stands up quickly., dizziness - no, syncope/near-syncope - no; TIA/amaurosis fugax - no Melena - no, but does have occasional blood when he wipes from hemorrhoids.; hematochezia no; hematuria - no; nosebleeds - no;  claudication - he does note that his legs feel somewhat tight , but for now he is more short of breath and the like tightness is an after thought  Past Medical History  Diagnosis Date  . Diabetes mellitus type II, controlled, with no complications   . Hypertension   . Substance abuse   . Anxiety   . Obesity, Class II, BMI 35-39.9, with comorbidity   . Obesity, diabetes, and hypertension syndrome   . Hepatitis C, chronic    Prior Cardiac Evaluation and Past Surgical History: History reviewed. No pertinent past surgical history.  No Known Allergies  Current Outpatient Prescriptions  Medication Sig Dispense Refill  . amLODipine (NORVASC) 5 MG tablet Take 1 tablet (5 mg total) by mouth daily.  90 tablet  1  . aspirin 81 MG tablet Take 81 mg by mouth daily.      . Blood Glucose Monitoring Suppl SUPPLIES MISC Use as directed  100 each  0  . glipiZIDE (GLUCOTROL XL) 10 MG 24 hr tablet Take 2 tablets (20 mg total) by mouth daily.  180 tablet  1  . losartan-hydrochlorothiazide (HYZAAR) 100-25 MG per tablet Take 1 tablet by mouth daily.  90 tablet  1  . metFORMIN (GLUCOPHAGE) 1000 MG tablet Take 1 tablet (1,000 mg total) by mouth 2 (two) times daily with a meal.  180 tablet  1  . tamsulosin (FLOMAX) 0.4 MG CAPS capsule Take 1 tablet by mouth daily to help bladder  symptoms.  30 capsule  3   No current facility-administered medications for this visit.    History   Social History Narrative   His married father of 4.  His married for 28 years.  He does with his wife and daughters.  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   He quit smoking in 1985.  He drinks 3 beers a week.   Prior to the onset of his current symptoms, used to work out with weights for at least 40 minutes a day for 3 times a week.  He usually did light weights for many occasions as opposed to heavy weights.      Contacts: Wife [name]; daughter [name]   family history  includes Cancer in his sister; Cerebral aneurysm in his mother; Diabetes in his father; Hypertension in his father; Stroke in his father.   ROS: A comprehensive Review of Systems - Negative except Pertinent positives noted above. General ROS: positive for  - fatigue  PHYSICAL EXAM BP 144/80  Pulse 98  Ht 5\' 7"  (1.702 m)  Wt 223 lb 12.8 oz (101.515 kg)  BMI 35.04 kg/m2 General appearance: Healthy-appearing, well-nourished well-groomed.  Moderately obese.  Answers questions appropriately.  Normal mood and affect. Neck: no adenopathy, no carotid bruit, no JVD, supple, symmetrical, trachea midline and thyroid not enlarged, symmetric, no tenderness/mass/nodules Lungs: clear to auscultation bilaterally, normal percussion bilaterally and Nonlabored with good air movement Heart: normal apical impulse, regular rate and rhythm, S1: normal, S2: physiologically split, no S3 or S4, no rub and No murmur Abdomen: soft, non-tender; bowel sounds normal; no masses,  no organomegaly and Moderate truncal obesity Extremities: extremities normal, atraumatic, no cyanosis or edema Pulses: 2+ and symmetric Skin: Skin color, texture, turgor normal. No rashes or lesions Neurologic: Alert and oriented X 3, normal strength and tone. Normal symmetric reflexes. Normal coordination and gait HEENT: /AT, EOMI, MMM, anicteric sclera  [id number]:WRUEAVWUJ today: Yes Rate: 98 , Rhythm:  Sinus Rhythm with a frequent PVCs;  Conduction Delay bifascicular block: RBBB, LAFB  Recent Labs: Recently checked by PCP, I have reviewed the results on Epic; lipid panel was not checked on last set;  Panel from June 2014: TC 169, TG 69, HDL 63, LDL 92 --> pending outcome stress test, this is relatively at goal.  ASSESSMENT / PLAN: Exertional dyspnea This is a relatively new onset of symptoms.  He also has that, epigastric discomfort associated with it.  I am quite concerned that this could be an anginal equivalent.  His ECG is also quite  abnormal with bifascicular block which can easily be associated with ischemia.   He has significant risk factors as noted in this PMH and problem list.  Plan: Exercise Myoview;  Would consider beta blocker next visit,  Continue ARB and aspirin Aggressive glycemic and cholesterol control.  Abnormal resting ECG findings - bifascicular block with PVCs Bifascicular block with PVCs -- with his cardiac risk factors and symptoms, this is very concerning for potential ischemic changes.  Plan: Exercise Myoview  Obesity (BMI 30-39.9) He was in the process of trying to increase his exercise, and that's when he started noticing his dyspnea.  The hope is that we can determine if there is any cardiac etiology associated with his dyspnea.  Otherwise I would like her to get back to his exercise routine with the addition of some or cardiovascular type exercise.  He also can't be need for dietary modification especially with his  diabetes.  DYSLIPIDEMIA Next is a very good cholesterol panel for someone who does not have coronary disease, but his affect is his goal LDL would be less than 100, which he already is.  HYPERTENSION, ESSENTIAL NOS An ARB inhibitor, HCTZ long with ostial blocker.  This is great for a diabetic.  His blood pressure however is not well controlled today.  We'll CL he doesn't stress test in the midportion of the stress test.  May need to add a beta blocker.  His resting heart rate is in the 90s.    Orders Placed This Encounter  Procedures  . Myocardial Perfusion Imaging    Standing Status: Future     Number of Occurrences:      Standing Expiration Date: 06/01/2014    Order Specific Question:  Where should this test be performed    Answer:  [facility]    Order Specific Question:  Type of stress    Answer:  Exercise    Order Specific Question:  Patient weight in lbs    Answer:  223  . EKG 12-Lead   No orders of the defined types were placed in this encounter.     Followup: 2-3 weeks  Torsten W. Herbie Baltimore, M.D., M.S. [facility] [address] [place], [place]  [address]  [phone] Pager # [phone]

## 2013-06-01 NOTE — Assessment & Plan Note (Signed)
This is a relatively new onset of symptoms.  He also has that, epigastric discomfort associated with it.  I am quite concerned that this could be an anginal equivalent.  His ECG is also quite abnormal with bifascicular block which can easily be associated with ischemia.   He has significant risk factors as noted in this PMH and problem list.  Plan: Exercise Myoview;  Would consider beta blocker next visit,  Continue ARB and aspirin Aggressive glycemic and cholesterol control.

## 2013-06-01 NOTE — Assessment & Plan Note (Signed)
An ARB inhibitor, HCTZ long with ostial blocker.  This is great for a diabetic.  His blood pressure however is not well controlled today.  We'll CL he doesn't stress test in the midportion of the stress test.  May need to add a beta blocker.  His resting heart rate is in the 90s.

## 2013-06-01 NOTE — Patient Instructions (Signed)
I am a bit concerned about your shortness of breath with exertion, and it is getting worse.  CT is abnormal, which could be indicative of possible coronary (heart artery) disease.  With the having diabetes, you may not have classic chest tightness and pressure symptoms it comes along with coronary disease called angina.  You do have high blood pressure high cholesterol and diabetes which are all risk factors of for coronary disease.  Abdomen to evaluate this with a treadmill nuclear stress test.  I will followup with you after the test is done.  If you have any of these symptoms at rest or with minimal exertion, I would like you to contact us and potentially go to the emergency room if it occurs over the weekend.  Marykay Lex, MD  Your physician wants you to follow-up in: 2 weeks. You will receive a reminder letter in the mail two months in advance. If you don't receive a letter, please call our office to schedule the follow-up appointment.  Your physician has requested that you have en exercise stress myoview. For further information please visit [email or web address] Please follow instruction sheet, as given.

## 2013-06-01 NOTE — Assessment & Plan Note (Signed)
Next is a very good cholesterol panel for someone who does not have coronary disease, but his affect is his goal LDL would be less than 100, which he already is.

## 2013-06-02 HISTORY — PX: OTHER SURGICAL HISTORY: SHX169

## 2013-06-02 LAB — HEPATITIS C GENOTYPE

## 2013-06-03 ENCOUNTER — Ambulatory Visit (HOSPITAL_COMMUNITY)
Admission: RE | Admit: 2013-06-03 | Discharge: 2013-06-03 | Disposition: A | Discharge status: Home / Self Care | Payer: BC Managed Care – PPO | Source: Ambulatory Visit | Attending: Cardiovascular Disease | Admitting: Cardiovascular Disease

## 2013-06-03 ENCOUNTER — Telehealth: Payer: Self-pay | Admitting: *Deleted

## 2013-06-03 ENCOUNTER — Encounter: Payer: Self-pay | Admitting: Family Medicine

## 2013-06-03 DIAGNOSIS — I10 Essential (primary) hypertension: Secondary | ICD-10-CM | POA: Insufficient documentation

## 2013-06-03 DIAGNOSIS — R0989 Other specified symptoms and signs involving the circulatory and respiratory systems: Secondary | ICD-10-CM | POA: Insufficient documentation

## 2013-06-03 DIAGNOSIS — E669 Obesity, unspecified: Secondary | ICD-10-CM | POA: Insufficient documentation

## 2013-06-03 DIAGNOSIS — R0609 Other forms of dyspnea: Secondary | ICD-10-CM | POA: Insufficient documentation

## 2013-06-03 DIAGNOSIS — R0600 Dyspnea, unspecified: Secondary | ICD-10-CM

## 2013-06-03 DIAGNOSIS — E785 Hyperlipidemia, unspecified: Secondary | ICD-10-CM

## 2013-06-03 MED ORDER — TECHNETIUM TC 99M SESTAMIBI GENERIC - CARDIOLITE
31.6000 | Freq: Once | INTRAVENOUS | Status: AC | PRN
  Administered 2013-06-03: 32 via INTRAVENOUS

## 2013-06-03 MED ORDER — TECHNETIUM TC 99M SESTAMIBI GENERIC - CARDIOLITE
10.9000 | Freq: Once | INTRAVENOUS | Status: AC | PRN
  Administered 2013-06-03: 10.9 via INTRAVENOUS

## 2013-06-03 NOTE — Telephone Encounter (Signed)
Spoke to patient. Result given . Verbalized understanding  

## 2013-06-03 NOTE — Procedures (Addendum)
[facility] [address] [place] [address] [phone]  Cardiology Nuclear Med Study  Todd Greer is a 57 y.o. male     MRN : [id number]     DOB: 1955/12/25  Procedure Date: 06/03/2013  Nuclear Med Background Indication for Stress Test:  Evaluation for Ischemia History:  No prior cardiac history Cardiac Risk Factors: History of Smoking, Hypertension, Lipids, NIDDM, Overweight and RBBB  Symptoms:  DOE and Palpitations   Nuclear Pre-Procedure Caffeine/Decaff Intake:  7:00pm NPO After: 5:00am   IV Site: R Hand  IV 0.9% NS with Angio Cath:  22g  Chest Size (in):  46 IV Started by: Koren Shiver, CNMT  Height: 5\' 7"  (1.702 m)  Cup Size: n/a  BMI:  Body mass index is 34.92 kg/(m^2). Weight:  223 lb (101.152 kg)   Tech Comments:  Held Amlodipine 24 hrs prior to test    Nuclear Med Study 1 or 2 day study: 1 day  Stress Test Type:  Stress  Order Authorizing Provider:  Bryan Lemma, MD   Resting Radionuclide: Technetium 99m Sestamibi  Resting Radionuclide Dose: 10.9 mCi   Stress Radionuclide:  Technetium 39m Sestamibi  Stress Radionuclide Dose: 31.6 mCi           Stress Protocol Rest HR: 98 Stress HR: 173  Rest BP: 124/100 Stress BP: 190/108  Exercise Time (min): 8 METS: 10.1   Predicted Max HR: 163 bpm % Max HR: 106.13 bpm Rate Pressure Product: 21308  Dose of Adenosine (mg):  n/a Dose of Lexiscan: n/a mg  Dose of Atropine (mg): n/a Dose of Dobutamine: n/a mcg/kg/min (at max HR)  Stress Test Technologist: Esperanza Sheets, CCT Nuclear Technologist: Gonzella Lex, CNMT   Rest Procedure:  Myocardial perfusion imaging was performed at rest 45 minutes following the intravenous administration of Technetium 8m Sestamibi. Stress Procedure:  The patient performed treadmill exercise using a Bruce  Protocol for 8 minutes. The patient stopped due to  SOB and denied any chest pain.  There were no significant ST-T wave  changes.  Technetium 7m Sestamibi was injected at peak exercise and myocardial perfusion imaging was performed after a brief delay.  Transient Ischemic Dilatation (Normal <1.22):  0.86 Lung/Heart Ratio (Normal <0.45):  0.29 QGS EDV:  n/a ml QGS ESV:  n/a ml LV Ejection Fraction: Study not gated     Rest ECG: NSR-RBBB , LAFB, frequent PVCs  Stress ECG: No significant change from baseline ECG  QPS Raw Data Images:  Normal; no motion artifact; normal heart/lung ratio. Stress Images:  Normal homogeneous uptake in all areas of the myocardium. Rest Images:  Normal homogeneous uptake in all areas of the myocardium. Subtraction (SDS):  No evidence of ischemia. LV Wall Motion:  non gated study due to ectopy  Impression Exercise Capacity:  Good exercise capacity. BP Response:  Hypertensive blood pressure response. Clinical Symptoms:  There is dyspnea. ECG Impression:  No significant ST segment change suggestive of ischemia. Comparison with Prior Nuclear Study: No previous nuclear study performed   Overall Impression:  Normal stress nuclear study. No gated images.   Thurmon Fair, MD  06/03/2013 12:39 PM

## 2013-06-03 NOTE — Telephone Encounter (Signed)
Message copied by Tobin Chad on Thu Jun 03, 2013  4:28 PM ------      Message from: Marykay Lex      Created: Thu Jun 03, 2013  3:43 PM       [facility] News!! - No evidence to suggest big vessel CAD.      From this result, I think medical therapy is the best option.            Marykay Lex, MD       ------

## 2013-06-08 ENCOUNTER — Encounter: Payer: Self-pay | Admitting: Family Medicine

## 2013-06-15 ENCOUNTER — Encounter: Payer: Self-pay | Admitting: Cardiology

## 2013-06-15 ENCOUNTER — Ambulatory Visit (INDEPENDENT_AMBULATORY_CARE_PROVIDER_SITE_OTHER): Payer: BC Managed Care – PPO | Admitting: Cardiology

## 2013-06-15 VITALS — BP 116/60 | HR 68 | Ht 66.0 in | Wt 222.0 lb

## 2013-06-15 DIAGNOSIS — R0609 Other forms of dyspnea: Secondary | ICD-10-CM

## 2013-06-15 DIAGNOSIS — I10 Essential (primary) hypertension: Secondary | ICD-10-CM

## 2013-06-15 DIAGNOSIS — E669 Obesity, unspecified: Secondary | ICD-10-CM

## 2013-06-15 DIAGNOSIS — R0989 Other specified symptoms and signs involving the circulatory and respiratory systems: Secondary | ICD-10-CM

## 2013-06-15 DIAGNOSIS — R9431 Abnormal electrocardiogram [ECG] [EKG]: Secondary | ICD-10-CM

## 2013-06-15 DIAGNOSIS — R0600 Dyspnea, unspecified: Secondary | ICD-10-CM

## 2013-06-15 NOTE — Patient Instructions (Signed)
Good news on the stress test result.  This would argue against any significant heart artery disease explaining your abnormal ECG.  I think watchful waiting is the best plan.  Provided everything continues to go well, we can simply see you on an "As NEEDED Basis".  We are always here if you have new issues / concerns.  Marykay Lex, MD

## 2013-06-15 NOTE — Progress Notes (Signed)
PCP: Dow Adolph, MD  Clinic Note: Chief Complaint  Patient presents with  . Follow-up    RESULT  MYOVIEW, NO CHEST PAIN ,NO SOB, NO EDEMA    HPI: Todd Greer is a 57 y.o. male with a PMH below who presents today for followup evaluation of an abnormal ECG performed in order to evaluate shortness of breath with exertion.  He has cardiac risk factors noted his past medical history.  I saw him on September 30, we ordered a exercise Myoview, that was performed on October 2. I personally reviewed the films and agree with the findings of no ischemia or infarction. Unfortunately there were no gated images due to ectopy.  Interval History: He presents today stating that most of his symptoms are seemed to be improved. He really does not note that much the way of any significant chest discomfort or dyspnea. That has improved and he is become more active now. He does have some tightness intermittently but that was after doing some pushups. His fatigue and tiredness of is also improving. No further lightheadedness. The remainder of Cardiovascular ROS: negative for - chest pain, dyspnea on exertion, edema, irregular heartbeat, loss of consciousness, murmur, orthopnea, palpitations, rapid heart rate or shortness of breath: He does have occasional PND orthopnea, but not routine Additional cardiac review of systems: Lightheadedness -only when he stands up too quickly., dizziness - no, syncope/near-syncope - no; TIA/amaurosis fugax - no Melena - no, but does have occasional blood when he wipes from hemorrhoids.; hematochezia no; hematuria - no; nosebleeds - no; claudication - he does note that his legs feel somewhat tight , but for now he is more short of breath and the like tightness is an after thought  Past Medical History  Diagnosis Date  . Diabetes mellitus type II, controlled, with no complications   . Hypertension   . Substance abuse   . Anxiety   . Obesity, Class II, BMI 35-39.9, with  comorbidity   . Obesity, diabetes, and hypertension syndrome   . Hepatitis C, chronic    Prior Cardiac Evaluation and Past Surgical History: Past Surgical History  Procedure Laterality Date  . Nm  exercise myoview ltd  06-2013    No ischemia or infarction; note dated images    No Known Allergies  Current Outpatient Prescriptions  Medication Sig Dispense Refill  . amLODipine (NORVASC) 5 MG tablet Take 1 tablet (5 mg total) by mouth daily.  90 tablet  1  . aspirin 81 MG tablet Take 81 mg by mouth daily.      . Blood Glucose Monitoring Suppl SUPPLIES MISC Use as directed  100 each  0  . glipiZIDE (GLUCOTROL XL) 10 MG 24 hr tablet Take 2 tablets (20 mg total) by mouth daily.  180 tablet  1  . losartan-hydrochlorothiazide (HYZAAR) 100-25 MG per tablet Take 1 tablet by mouth daily.  90 tablet  1  . metFORMIN (GLUCOPHAGE) 1000 MG tablet Take 1 tablet (1,000 mg total) by mouth 2 (two) times daily with a meal.  180 tablet  1  . Multiple Vitamin (MULTIVITAMIN) tablet Take 1 tablet by mouth daily.      . tamsulosin (FLOMAX) 0.4 MG CAPS capsule Take 1 tablet by mouth daily to help bladder symptoms.  30 capsule  3   No current facility-administered medications for this visit.    History   Social History Narrative   His married father of 4.  His married for 28 years.  He does with  his wife and daughters.  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   He quit smoking in 1985.  He drinks 3 beers a week.   Prior to the onset of his current symptoms, used to work out with weights for at least 40 minutes a day for 3 times a week.  He usually did light weights for many occasions as opposed to heavy weights.      Contacts: Wife [name]; daughter [name]   family history includes Cancer in his sister; Cerebral aneurysm in his mother; Diabetes in his father; Hypertension in his father; Stroke in his father.  ROS: A comprehensive Review of Systems -  Negative except Pertinent positives noted above. General ROS: positive for  - fatigue  PHYSICAL EXAM BP 116/60  Pulse 68  Ht 5\' 6"  (1.676 m)  Wt 222 lb (100.699 kg)  BMI 35.85 kg/m2 General appearance: Healthy-appearing, well-nourished well-groomed.  Moderately obese.  Answers questions appropriately.  Normal mood and affect. Neck: no adenopathy, no carotid bruit, no JVD, supple, Lungs: clear to auscultation bilaterally, normal percussion bilaterally and Nonlabored with good air movement Heart: normal apical impulse, regular rate and rhythm, S1: normal, S2: physiologically split, no/R./G.  Abdomen: soft, non-tender; bowel sounds normal; no masses,  no organomegaly and Moderate truncal obesity Extremities: extremities normal, atraumatic, no cyanosis or edema; Pulses: 2+ and symmetric Neurologic: Alert and oriented X 3, normal strength and tone. Normal symmetric reflexes. Normal coordination and gait HEENT: [place]/AT, EOMI, MMM, anicteric sclera  ZOX:WRUEAVWUJ today: No  Recent Labs: Panel from June 2014: TC 169, TG 69, HDL 63, LDL 92 --> pending outcome stress test, this is relatively at goal.  ASSESSMENT / PLAN: Exertional dyspnea Overall, his symptoms seems to have improved. With a negative Myoview this is probably due to deconditioning. There was no evidence of any macrovascular coronary disease.  Recommend: Continue to treat risk factors with close glycemic, lipid, blood pressure control.  Abnormal resting ECG findings - bifascicular block with PVCs Unfortunately he did have PVCs during the stress test, so did not get any description of his cardiac function. He is not having any true heartburn symptoms however. With a negative Myoview, we can simply just call his abnormal ECG but it is but not suggestive of ischemic heart disease.  Obesity (BMI 30-39.9) He should feel good now about getting into an exercise program. I think the main thing is that he needed to take a gradual and not get  into a do much exercise too fast to avoid the dyspnea and discomfort that would cause him to potentially stop. Start her the importance of dietary modification that would go along with the diabetes and lipid control.  HYPERTENSION, ESSENTIAL NOS Blood pressure is relatively well controlled today on current medications. With his dyspnea and lack of true evidence of coronary disease, I think it is fine to not use a beta blocker.    No orders of the defined types were placed in this encounter.    Followup: When necessary  Hurbert W. Herbie Baltimore, M.D., M.S. [facility] [address] [place], [place]  [address]  [phone] Pager # [phone]

## 2013-06-16 ENCOUNTER — Encounter: Payer: Self-pay | Admitting: Cardiology

## 2013-06-16 NOTE — Assessment & Plan Note (Signed)
He should feel good now about getting into an exercise program. I think the main thing is that he needed to take a gradual and not get into a do much exercise too fast to avoid the dyspnea and discomfort that would cause him to potentially stop. Start her the importance of dietary modification that would go along with the diabetes and lipid control.

## 2013-06-16 NOTE — Assessment & Plan Note (Signed)
Blood pressure is relatively well controlled today on current medications. With his dyspnea and lack of true evidence of coronary disease, I think it is fine to not use a beta blocker.

## 2013-06-16 NOTE — Assessment & Plan Note (Signed)
Overall, his symptoms seems to have improved. With a negative Myoview this is probably due to deconditioning. There was no evidence of any macrovascular coronary disease.  Recommend: Continue to treat risk factors with close glycemic, lipid, blood pressure control.

## 2013-06-16 NOTE — Assessment & Plan Note (Signed)
Unfortunately he did have PVCs during the stress test, so did not get any description of his cardiac function. He is not having any true heartburn symptoms however. With a negative Myoview, we can simply just call his abnormal ECG but it is but not suggestive of ischemic heart disease.

## 2013-07-20 ENCOUNTER — Encounter: Payer: Self-pay | Admitting: Family Medicine

## 2013-07-20 DIAGNOSIS — Z860100 Personal history of colon polyps, unspecified: Secondary | ICD-10-CM | POA: Insufficient documentation

## 2013-08-20 ENCOUNTER — Ambulatory Visit: Payer: BC Managed Care – PPO

## 2013-08-20 ENCOUNTER — Ambulatory Visit (INDEPENDENT_AMBULATORY_CARE_PROVIDER_SITE_OTHER): Payer: BC Managed Care – PPO | Admitting: Family Medicine

## 2013-08-20 ENCOUNTER — Encounter: Payer: Self-pay | Admitting: Family Medicine

## 2013-08-20 VITALS — BP 130/90 | HR 76 | Temp 98.4°F | Resp 16 | Ht 66.0 in | Wt 221.4 lb

## 2013-08-20 DIAGNOSIS — E119 Type 2 diabetes mellitus without complications: Secondary | ICD-10-CM

## 2013-08-20 DIAGNOSIS — B1710 Acute hepatitis C without hepatic coma: Secondary | ICD-10-CM

## 2013-08-20 DIAGNOSIS — M25559 Pain in unspecified hip: Secondary | ICD-10-CM

## 2013-08-20 DIAGNOSIS — M25552 Pain in left hip: Secondary | ICD-10-CM

## 2013-08-20 DIAGNOSIS — N529 Male erectile dysfunction, unspecified: Secondary | ICD-10-CM | POA: Insufficient documentation

## 2013-08-20 DIAGNOSIS — I10 Essential (primary) hypertension: Secondary | ICD-10-CM

## 2013-08-20 LAB — POCT GLYCOSYLATED HEMOGLOBIN (HGB A1C): Hemoglobin A1C: 6.8

## 2013-08-20 MED ORDER — TADALAFIL 20 MG PO TABS: ORAL_TABLET | ORAL | Status: DC

## 2013-08-20 MED ORDER — GLIPIZIDE ER 10 MG PO TB24: 20.0000 mg | ORAL_TABLET | Freq: Every day | ORAL | Status: DC

## 2013-08-20 MED ORDER — BLOOD GLUCOSE MONITORING SUPPL SUPPLIES MISC: Status: DC

## 2013-08-20 MED ORDER — METFORMIN HCL 1000 MG PO TABS: 1000.0000 mg | ORAL_TABLET | Freq: Two times a day (BID) | ORAL | Status: DC

## 2013-08-20 MED ORDER — LOSARTAN POTASSIUM-HCTZ 100-25 MG PO TABS: 1.0000 | ORAL_TABLET | Freq: Every day | ORAL | Status: DC

## 2013-08-20 MED ORDER — AMLODIPINE BESYLATE 5 MG PO TABS: 5.0000 mg | ORAL_TABLET | Freq: Every day | ORAL | Status: DC

## 2013-08-20 MED ORDER — TAMSULOSIN HCL 0.4 MG PO CAPS: ORAL_CAPSULE | ORAL | Status: DC

## 2013-08-20 NOTE — Patient Instructions (Signed)
Erectile Dysfunction  Erectile dysfunction is the inability to get or sustain a good enough erection to have sexual intercourse. Erectile dysfunction may involve:   Inability to get an erection.   Lack of enough hardness to allow penetration.   Loss of the erection before sex is finished.   Premature ejaculation.  CAUSES   Certain drugs, such as:   Pain relievers.   Antihistamines.   Antidepressants.   Blood pressure medicines.   Water pills (diuretics).   Ulcer medicines.   Muscle relaxants.   Illegal drugs.   Excessive drinking.   Psychological causes, such as:   Anxiety.   Depression.   Sadness.   Exhaustion.   Performance fear.   Stress.   Physical causes, such as:   Artery problems. This may include diabetes, smoking, liver disease, or atherosclerosis.   High blood pressure.   Hormonal problems, such as low testosterone.   Obesity.   Nerve problems. This may include back or pelvic injuries, diabetes mellitus, multiple sclerosis, or Parkinson disease.  SYMPTOMS   Inability to get an erection.   Lack of enough hardness to allow penetration.   Loss of the erection before sex is finished.   Premature ejaculation.   Normal erections at some times, but with frequent unsatisfactory episodes.   Orgasms that are not satisfactory in sensation or frequency.   Low sexual satisfaction in either partner because of erection problems.   A curved penis occurring with erection. The curve may cause pain or may be too curved to allow for intercourse.   Never having nighttime erections.  DIAGNOSIS  Your caregiver can often diagnose this condition by:   Performing a physical exam to find other diseases or specific problems with the penis.   Asking you detailed questions about the problem.   Performing blood tests to check for diabetes mellitus or to measure hormone levels.   Performing urine tests to find other underlying health conditions.   Performing an ultrasound exam to check for  scarring.   Performing a test to check blood flow to the penis.   Doing a sleep study at home to measure nighttime erections.  TREATMENT    You may be prescribed medicines by mouth.   You may be given medicine injections into the penis.   You may be prescribed a vacuum pump with a ring.   Penile implant surgery may be performed. You may receive:   An inflatable implant.   A semirigid implant.   Blood vessel surgery may be performed.  HOME CARE INSTRUCTIONS   If you are prescribed oral medicine, you should take the medicine as prescribed. Do not increase the dosage without first discussing it with your physician.   If you are using self-injections, be careful to avoid any veins that are on the surface of the penis. Apply pressure to the injection site for 5 minutes.   If you are using a vacuum pump, make sure you have read the instructions before using it. Discuss any questions with your physician before taking the pump home.  SEEK MEDICAL CARE IF:   You experience pain that is not responsive to the pain medicine you have been prescribed.   You experience nausea or vomiting.  SEEK IMMEDIATE MEDICAL CARE IF:    When taking oral or injectable medications, you experience an erection that lasts longer than 4 hours. If your physician is unavailable, go to the nearest emergency room for evaluation. An erection that lasts much longer than 4 hours can   result in permanent damage to your penis.   You have pain that is severe.   You develop redness, severe pain, or severe swelling of your penis.   You have redness spreading up into your groin or lower abdomen.   You are unable to pass your urine.  Document Released: 08/16/2000 Document Revised: 04/21/2013 Document Reviewed: 01/21/2013  [employer] Patient Information 2014 [employer].

## 2013-08-20 NOTE — Progress Notes (Signed)
S: This 57 y.o. AA male has Type II DM w/ good control and medication compliance.  Joint pain- R shoulder intermittent x 1 year. Pt lifts weights (20 lbs, 20 reps for 15 sets). Sharp pain not relieved by Ibuprofen 600 mg.  Also having sharp L hip pain; onset w/ sleeping on L hip w/ set of keys in pocket. Pt does bench presses and other strength training and is pain-free with these exercises.  Type II DM- pt occasionally checks FSBS (no documentation). He is compliant w/ medications and stays physically active. He denies symptoms of hypoglycemia. He has no complaints of numbness or tingling in hands or feet.  Pt has Hep C and needs referral to Hepatologist. He saw Dr. Elnoria Howard for colonoscopy but no mention of Hep C evaluation or treatment.  Pt c/o erectile dysfunction; he is unable to sustain a firm erection for coitus. He is interested in a trial of medication. Pt has HTN and is stable on current medications. No reports of fatigue, diaphoresis, CP or tightness, palpitations, edema, HA, dizziness, numbness, weakness or syncope. Cardiology evaluation in Oct 2014 was negative.  Patient Active Problem List   Diagnosis Date Noted  . Erectile dysfunction 08/20/2013  . Personal history of colonic polyps 07/20/2013  . Exertional dyspnea 06/01/2013    Class: Acute  . Obesity (BMI 30-39.9) 06/01/2013  . Abnormal resting ECG findings - bifascicular block with PVCs 06/01/2013  . Type II DM w/o mention of complications, uncontrolled 05/28/2013  . HEPATITIS C 02/11/2007  . DYSLIPIDEMIA 02/11/2007  . ANXIETY DISORDER 02/11/2007  . ALCOHOL USE 02/11/2007  . HYPERTENSION, ESSENTIAL NOS 02/11/2007   PMHx, Soc and Fam Hx reviewed.  Medications reconciled.  ROS; As per HPI.   O: Filed Vitals:   08/20/13 1101  BP: 130/90  Pulse: 76  Temp: 98.4 F (36.9 C)  Resp: 16   GEN: In NAD: WN,WD. HENT: [place]/AT; EOMI w/ clear conj/sclerae. Ext ears and EACs/nose/oroph unremarkable. NECK: Supple w/o LAN or  TMG. COR: RRR; no edema. LUNGS: Normal resp rate and effort. MS: Pelvic ring stable. R hip normal. L hip- FROM w/o crepitus; point tenderness lateral aspect of upper thigh. NEURO: A&O x 3; CNs intact. Gait is normal. Nonfocal.   See DM Foot exam.   Results for orders placed in visit on 08/20/13  POCT GLYCOSYLATED HEMOGLOBIN (HGB A1C)      Result Value Range   Hemoglobin A1C 6.8      [facility] reading (PRIMARY) by  Dr. Audria Nine: L hip- normal bones and joint spaces; no dislocation or fractures. No acute abnormalities.   A/P: Hip pain, left - Advised no weight training for 2 weeks. With resumption of work-outs, advise lifting less weight w/ fewer sets.             Plan: DG Hip Complete Left  Type II or unspecified type diabetes mellitus without mention of complication, not stated as uncontrolled - Stable and well controlled on current medications. Plan: HM Diabetes Foot Exam, glipiZIDE (GLUCOTROL XL) 10 MG 24 hr tablet, metFORMIN (GLUCOPHAGE) 1000 MG tablet, POCT glycosylated hemoglobin (Hb A1C), CANCELED: POCT glucose (manual entry)  HEPATITIS C - Plan: Ambulatory referral to Gastroenterology  Erectile dysfunction- Trial of Cialis, starting w/ 10 mg and increase to 20 mg if lower dose ineffective.  Hypertension - Stable on current medications; no medication change at this time. Plan: amLODipine (NORVASC) 5 MG tablet, losartan-hydrochlorothiazide (HYZAAR) 100-25 MG per tablet  Meds ordered this encounter  Medications  . amLODipine (  NORVASC) 5 MG tablet    Sig: Take 1 tablet (5 mg total) by mouth daily.    Dispense:  90 tablet    Refill:  1            . glipiZIDE (GLUCOTROL XL) 10 MG 24 hr tablet    Sig: Take 2 tablets (20 mg total) by mouth daily.    Dispense:  180 tablet    Refill:  1            . losartan-hydrochlorothiazide (HYZAAR) 100-25 MG per tablet    Sig: Take 1 tablet by mouth daily.    Dispense:  90 tablet    Refill:  1            . metFORMIN (GLUCOPHAGE) 1000 MG  tablet    Sig: Take 1 tablet (1,000 mg total) by mouth 2 (two) times daily with a meal.    Dispense:  180 tablet    Refill:  1            . tamsulosin (FLOMAX) 0.4 MG CAPS capsule    Sig: Take 1 tablet by mouth daily to help bladder symptoms.    Dispense:  30 capsule    Refill:  3  . tadalafil (CIALIS) 20 MG tablet    Sig: Take 1/2 - 1 tablet once a day as needed 1-4 hours before activity.    Dispense:  5 tablet    Refill:  5  . Blood Glucose Monitoring Suppl SUPPLIES MISC    Sig: Use as directed    Dispense:  100 each    Refill:  1              Total encounter time: 50 minutes; Face-to-face time with pt= 40 minutes.

## 2013-09-14 ENCOUNTER — Encounter: Payer: Self-pay | Admitting: Family Medicine

## 2013-09-15 NOTE — Telephone Encounter (Signed)
Can you please check on his referral to gastroenterology.

## 2013-10-01 ENCOUNTER — Telehealth: Payer: Self-pay

## 2013-10-01 MED ORDER — SILDENAFIL CITRATE 50 MG PO TABS: 50.0000 mg | ORAL_TABLET | Freq: Every day | ORAL | Status: DC | PRN

## 2013-10-01 NOTE — Telephone Encounter (Signed)
I changed med to Viagra 50 mg. It has been routed to pt's pharmacy.

## 2013-10-01 NOTE — Telephone Encounter (Signed)
Notified pt. 

## 2013-10-01 NOTE — Telephone Encounter (Signed)
PA was denied for Cialis. Dr Audria Nine, although I haven't received the detailed denial so don't have specifics, I believe their preferred med is Viagra. Do you want to try Rxing this?

## 2013-12-03 ENCOUNTER — Encounter: Payer: Self-pay | Admitting: Family Medicine

## 2013-12-03 DIAGNOSIS — N400 Enlarged prostate without lower urinary tract symptoms: Secondary | ICD-10-CM | POA: Insufficient documentation

## 2013-12-24 ENCOUNTER — Encounter: Payer: Self-pay | Admitting: Family Medicine

## 2013-12-24 ENCOUNTER — Ambulatory Visit (INDEPENDENT_AMBULATORY_CARE_PROVIDER_SITE_OTHER): Payer: BC Managed Care – PPO | Admitting: Family Medicine

## 2013-12-24 ENCOUNTER — Ambulatory Visit: Payer: BC Managed Care – PPO

## 2013-12-24 VITALS — BP 133/91 | HR 80 | Temp 98.4°F | Resp 16 | Ht 65.5 in | Wt 220.0 lb

## 2013-12-24 DIAGNOSIS — Z87891 Personal history of nicotine dependence: Secondary | ICD-10-CM

## 2013-12-24 DIAGNOSIS — R0609 Other forms of dyspnea: Secondary | ICD-10-CM

## 2013-12-24 DIAGNOSIS — R0989 Other specified symptoms and signs involving the circulatory and respiratory systems: Secondary | ICD-10-CM

## 2013-12-24 DIAGNOSIS — E119 Type 2 diabetes mellitus without complications: Secondary | ICD-10-CM

## 2013-12-24 DIAGNOSIS — R0600 Dyspnea, unspecified: Secondary | ICD-10-CM

## 2013-12-24 LAB — POCT GLYCOSYLATED HEMOGLOBIN (HGB A1C): Hemoglobin A1C: 6.7

## 2013-12-24 NOTE — Patient Instructions (Signed)
Chest xray appears normal. An official interpretation will be provided by a radiologist.  Your Diabetes is under good control.. Continue current medications and nutrition. Stay physically active.

## 2013-12-24 NOTE — Progress Notes (Signed)
S;  This 58 y.o. AA male is here for Type II DM follow-up. Pt is compliant w/ medications and nutrition. He exercises regularly. Glucometer broke so not checking FSBS. Pt reports infrequent hypoglycemia when he skips meals.  Pt c/o intermittent dyspnea; had eval w/ cardiology and successfully completed stress test/ETT. He denies fever/chills, fatigue, diaphoresis, abnormal weight loss, CP or tightness, palpitations, cough, orthopnea, n/v, dizziness, HA, weakness or syncope. His wife has not reported apnea. He has no anxiety, agitation or confusion.  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 He manages an assisted living facility.  Patient Active Problem List   Diagnosis Date Noted  . Hypertrophy of prostate without urinary obstruction and other lower urinary tract symptoms (LUTS) 12/03/2013  . Erectile dysfunction 08/20/2013  . Personal history of colonic polyps 07/20/2013  . Exertional dyspnea 06/01/2013    Class: Acute  . Obesity (BMI 30-39.9) 06/01/2013  . Abnormal resting ECG findings - bifascicular block with PVCs 06/01/2013  . Type II DM w/o mention of complications, uncontrolled 05/28/2013  . HEPATITIS C 02/11/2007  . DYSLIPIDEMIA 02/11/2007  . ANXIETY DISORDER 02/11/2007  . ALCOHOL USE 02/11/2007  . HYPERTENSION, ESSENTIAL NOS 02/11/2007    Prior to Admission medications   Medication Sig Start Date End Date Taking? Authorizing Provider  amLODipine (NORVASC) 5 MG tablet Take 1 tablet (5 mg total) by mouth daily. 08/20/13  Yes Maurice March, MD  aspirin 81 MG tablet Take 81 mg by mouth daily.   Yes Historical Provider, MD  Blood Glucose Monitoring Suppl SUPPLIES MISC Use as directed 08/20/13  Yes Maurice March, MD  glipiZIDE (GLUCOTROL XL) 10 MG 24 hr tablet Take 2 tablets (20 mg total) by mouth daily. 08/20/13  Yes Maurice March, MD  losartan-hydrochlorothiazide (HYZAAR) 100-25  MG per tablet Take 1 tablet by mouth daily. 08/20/13  Yes Maurice March, MD  metFORMIN (GLUCOPHAGE) 1000 MG tablet Take 1 tablet (1,000 mg total) by mouth 2 (two) times daily with a meal. 08/20/13  Yes Maurice March, MD  Multiple Vitamin (MULTIVITAMIN) tablet Take 1 tablet by mouth daily.   Yes Historical Provider, MD  sildenafil (VIAGRA) 50 MG tablet Take 1 tablet (50 mg total) by mouth daily as needed for erectile dysfunction. 10/01/13  Yes Maurice March, MD  tamsulosin (FLOMAX) 0.4 MG CAPS capsule Take 1 tablet by mouth daily to help bladder symptoms. 08/20/13  Yes Maurice March, MD    PMHx, Surg Hx, Soc and Fam Hx reviewed.  ROS: As per HPI.   O: Filed Vitals:   12/24/13 1551  BP: 133/91  Pulse: 80  Temp: 98.4 F (36.9 C)  Resp: 16   GEN: In NAD: WN,WD. Weight is stable. HENT: [place]/AT; EOMI w/ clear conj and muddy sclerae. EACs/nose/oroph umremarkable. NECK: Supple w/o LAN.  COR: RRR. No edema. Distal pulses 2+/=. LUNGS: CTA; normal resp rate and effort. No wheezes or rhonchi. Good BS throughout lung fields. BACK: Straight w/o deformities. No CVAT. MS: MAEs; no joint deformities or muscle atrophy. SKIN: W&D; intact w/o diaphoresis, erythema or rashes.  See DM FOOT Exam. NEURO: A&O x 3; CNs intact. Nonfocal.   Results for orders placed in visit on 12/24/13  POCT GLYCOSYLATED HEMOGLOBIN (HGB A1C)      Result Value Ref Range   Hemoglobin A1C 6.7      [facility] reading (PRIMARY) by  Dr. Audria Nine:  CXR- Lung fields clear and normal heart size and vascularity. No active disease. Bones appear normal.   A/P: Type II or unspecified type diabetes mellitus without mention of complication, not stated as uncontrolled - Stable on current medications. Continue same.  Plan: HM Diabetes Foot Exam, POCT glycosylated hemoglobin (Hb A1C), For home use only DME Glucometer  Dyspnea - Pt had negative Cardiac work-up. Has hx of + PPD years ago (treated).    Plan: DG Chest 2  View  History of tobacco use - Pt quit 30 years ago.     Plan: DG Chest 2 View

## 2013-12-27 ENCOUNTER — Encounter: Payer: Self-pay | Admitting: Family Medicine

## 2014-02-07 ENCOUNTER — Other Ambulatory Visit: Payer: Self-pay | Admitting: Family Medicine

## 2014-02-10 ENCOUNTER — Other Ambulatory Visit: Payer: Self-pay | Admitting: Family Medicine

## 2014-03-17 ENCOUNTER — Telehealth: Payer: Self-pay | Admitting: Family Medicine

## 2014-03-17 NOTE — Telephone Encounter (Signed)
LMOM to CB regarding routine health care for DM and hyperlipidemia.

## 2014-03-17 NOTE — Telephone Encounter (Signed)
DM management;Pt has appt in Oct. He would like to see a nutritionist for DM

## 2014-04-07 ENCOUNTER — Other Ambulatory Visit: Payer: Self-pay | Admitting: Family Medicine

## 2014-06-03 ENCOUNTER — Ambulatory Visit (INDEPENDENT_AMBULATORY_CARE_PROVIDER_SITE_OTHER): Payer: BC Managed Care – PPO | Admitting: Family Medicine

## 2014-06-03 ENCOUNTER — Encounter: Payer: Self-pay | Admitting: Family Medicine

## 2014-06-03 VITALS — BP 150/105 | HR 61 | Temp 98.6°F | Resp 16 | Ht 66.5 in | Wt 221.0 lb

## 2014-06-03 DIAGNOSIS — I10 Essential (primary) hypertension: Secondary | ICD-10-CM

## 2014-06-03 DIAGNOSIS — Z0000 Encounter for general adult medical examination without abnormal findings: Secondary | ICD-10-CM

## 2014-06-03 DIAGNOSIS — R5383 Other fatigue: Secondary | ICD-10-CM

## 2014-06-03 DIAGNOSIS — T50905A Adverse effect of unspecified drugs, medicaments and biological substances, initial encounter: Secondary | ICD-10-CM

## 2014-06-03 DIAGNOSIS — Z23 Encounter for immunization: Secondary | ICD-10-CM

## 2014-06-03 DIAGNOSIS — E119 Type 2 diabetes mellitus without complications: Secondary | ICD-10-CM

## 2014-06-03 LAB — COMPLETE METABOLIC PANEL WITH GFR
ALT: 56 U/L — ABNORMAL HIGH (ref 0–53)
AST: 40 U/L — ABNORMAL HIGH (ref 0–37)
Albumin: 4 g/dL (ref 3.5–5.2)
Alkaline Phosphatase: 55 U/L (ref 39–117)
BUN: 11 mg/dL (ref 6–23)
CO2: 25 mEq/L (ref 19–32)
Calcium: 9.5 mg/dL (ref 8.4–10.5)
Chloride: 101 mEq/L (ref 96–112)
Creat: 0.86 mg/dL (ref 0.50–1.35)
GFR, Est African American: >89 mL/min
GFR, Est Non African American: >89 mL/min
Glucose, Bld: 157 mg/dL — ABNORMAL HIGH (ref 70–99)
Potassium: 3.9 mEq/L (ref 3.5–5.3)
Sodium: 135 mEq/L (ref 135–145)
Total Bilirubin: 0.7 mg/dL (ref 0.2–1.2)
Total Protein: 7.3 g/dL (ref 6.0–8.3)

## 2014-06-03 LAB — CBC WITH DIFFERENTIAL/PLATELET
Basophils Absolute: 0.1 10*3/uL (ref 0.0–0.1)
Basophils Relative: 1 % (ref 0–1)
Eosinophils Absolute: 0.1 10*3/uL (ref 0.0–0.7)
Eosinophils Relative: 2 % (ref 0–5)
HCT: 45.3 % (ref 39.0–52.0)
Hemoglobin: 15.9 g/dL (ref 13.0–17.0)
Lymphocytes Relative: 34 % (ref 12–46)
Lymphs Abs: 1.8 10*3/uL (ref 0.7–4.0)
MCH: 29 pg (ref 26.0–34.0)
MCHC: 35.1 g/dL (ref 30.0–36.0)
MCV: 82.5 fL (ref 78.0–100.0)
Monocytes Absolute: 0.6 10*3/uL (ref 0.1–1.0)
Monocytes Relative: 12 % (ref 3–12)
Neutro Abs: 2.7 10*3/uL (ref 1.7–7.7)
Neutrophils Relative %: 51 % (ref 43–77)
Platelets: 214 10*3/uL (ref 150–400)
RBC: 5.49 MIL/uL (ref 4.22–5.81)
RDW: 13.7 % (ref 11.5–15.5)
WBC: 5.3 10*3/uL (ref 4.0–10.5)

## 2014-06-03 LAB — MICROALBUMIN, URINE: Microalb, Ur: 4.6 mg/dL — ABNORMAL HIGH (ref <2.0)

## 2014-06-03 LAB — LIPID PANEL
Cholesterol: 166 mg/dL (ref 0–200)
HDL: 62 mg/dL (ref >39)
LDL Cholesterol: 88 mg/dL (ref 0–99)
Total CHOL/HDL Ratio: 2.7 Ratio
Triglycerides: 81 mg/dL (ref <150)
VLDL: 16 mg/dL (ref 0–40)

## 2014-06-03 LAB — POCT GLYCOSYLATED HEMOGLOBIN (HGB A1C): Hemoglobin A1C: 7.3

## 2014-06-03 MED ORDER — SILDENAFIL CITRATE 50 MG PO TABS: 50.0000 mg | ORAL_TABLET | Freq: Every day | ORAL | Status: DC | PRN

## 2014-06-03 MED ORDER — METFORMIN HCL 1000 MG PO TABS: 1000.0000 mg | ORAL_TABLET | Freq: Two times a day (BID) | ORAL | Status: DC

## 2014-06-03 MED ORDER — LOSARTAN POTASSIUM-HCTZ 100-25 MG PO TABS: 1.0000 | ORAL_TABLET | Freq: Every day | ORAL | Status: DC

## 2014-06-03 MED ORDER — DESLORATADINE 5 MG PO TABS: 5.0000 mg | ORAL_TABLET | Freq: Every day | ORAL | Status: DC

## 2014-06-03 MED ORDER — TAMSULOSIN HCL 0.4 MG PO CAPS: ORAL_CAPSULE | ORAL | Status: DC

## 2014-06-03 MED ORDER — CARVEDILOL 3.125 MG PO TABS: 3.1250 mg | ORAL_TABLET | Freq: Two times a day (BID) | ORAL | Status: DC

## 2014-06-03 NOTE — Progress Notes (Signed)
Subjective:    Patient ID: Todd Greer, male    DOB: Jun 07, 1956, 58 y.o.   MRN: [id number]  HPI  This 58 y.o. AA male is here for CPE, DM folow-up (last A1c= 6.7% in April 2015) and med refills. Pt admits not taking Amlodipine for 2-3 months due to fatigue. He has Hep C and thinks that may be part of the problem also. Referral to Hep C clinic last Dec 2014- pt never heard about an appointment.  HCM: CRS- Oct 2014.           IMM- Current.           Vision- Needs assessment.           DRE/prostate- Per Dr. Sherron Monday.  Patient Active Problem List   Diagnosis Date Noted  . Hypertrophy of prostate without urinary obstruction and other lower urinary tract symptoms (LUTS) 12/03/2013  . Erectile dysfunction 08/20/2013  . Personal history of colonic polyps 07/20/2013  . Exertional dyspnea 06/01/2013    Class: Acute  . Obesity (BMI 30-39.9) 06/01/2013  . Abnormal resting ECG findings - bifascicular block with PVCs 06/01/2013  . DM II (diabetes mellitus, type II), controlled 05/28/2013  . HEPATITIS C 02/11/2007  . DYSLIPIDEMIA 02/11/2007  . ANXIETY DISORDER 02/11/2007  . ALCOHOL USE 02/11/2007  . Essential hypertension 02/11/2007    Prior to Admission medications   Medication Sig Start Date End Date Taking? Authorizing Provider  aspirin 81 MG tablet Take 81 mg by mouth daily.   Yes Historical Provider, MD  Blood Glucose Monitoring Suppl SUPPLIES MISC Use as directed 08/20/13  Yes Maurice March, MD  glipiZIDE (GLUCOTROL XL) 10 MG 24 hr tablet TAKE 2 TABLETS BY MOUTH DAILY. 02/10/14  Yes Maurice March, MD  losartan-hydrochlorothiazide (HYZAAR) 100-25 MG per tablet Take 1 tablet by mouth daily.   Yes Maurice March, MD  metFORMIN (GLUCOPHAGE) 1000 MG tablet Take 1 tablet (1,000 mg total) by mouth 2 (two) times daily with a meal.   Yes Maurice March, MD  Multiple Vitamin (MULTIVITAMIN) tablet Take 1 tablet by mouth daily.   Yes Historical Provider, MD  sildenafil  (VIAGRA) 50 MG tablet Take 1 tablet (50 mg total) by mouth daily as needed for erectile dysfunction.   Yes Maurice March, MD  tamsulosin (FLOMAX) 0.4 MG CAPS capsule TAKE 1 TABLET BY MOUTH DAILY TO HELP BLADDER SYMPTOMS. 06/03/14   Maurice March, MD     History   Social History  . Marital Status: Married    Spouse Name: N/A    Number of Children: N/A  . Years of Education: N/A   Occupational History  . Not on file.   Social History Main Topics  . Smoking status: Former Smoker -- 0.50 packs/day for 10 years    Types: Cigarettes    Quit date: 10/04/1983  . Smokeless tobacco: Never Used  . Alcohol Use: 3.0 oz/week    6 drink(s) per week  . Drug Use: No  . Sexual Activity: Yes   Other Topics Concern  . Not on file   Social History Narrative   His married father of 4.  His married for 28 years.  He lives with his wife and daughters.  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  Education: [employer].   He quit smoking in 1985.  He drinks 3 beers a week.   Prior to the onset of his current symptoms, used to work  out with weights for at least 40 minutes a day for 3 times a week.  He usually did light weights for many occasions as opposed to heavy weights.      Contacts: Wife Elinor DodgeGwendolyn; daughter Otho PerlValencia Smith    FAMILY Hx reviewed.   Review of Systems  Constitutional: Positive for fatigue.       Pt attributed this to BP med- Amlodipine; he stopped med > 2 months ago.  HENT: Positive for postnasal drip, rhinorrhea and sore throat. Negative for congestion, sneezing and trouble swallowing.   Eyes: Positive for visual disturbance.       No DM vision eval in years.  Respiratory: Positive for shortness of breath.   Cardiovascular: Negative.   Gastrointestinal: Negative.   Endocrine: Negative.   Genitourinary: Negative.   Musculoskeletal: Positive for arthralgias.  Skin: Negative.   Allergic/Immunologic: Positive for  environmental allergies.  Neurological: Negative.   Hematological: Negative.   Psychiatric/Behavioral: Negative.        Objective:   Physical Exam  Nursing note and vitals reviewed. Constitutional: He is oriented to person, place, and time. He appears well-developed and well-nourished. No distress.  HENT:  Head: Normocephalic and atraumatic.  Right Ear: Hearing, tympanic membrane, external ear and ear canal normal.  Left Ear: Hearing, tympanic membrane, external ear and ear canal normal.  Nose: Nose normal. No nasal deformity or septal deviation.  Mouth/Throat: Uvula is midline, oropharynx is clear and moist and mucous membranes are normal. He has dentures. No oral lesions.  Eyes: Conjunctivae, EOM and lids are normal. Pupils are equal, round, and reactive to light. No scleral icterus.  Fundoscopic exam:      The right eye shows no papilledema. The right eye shows red reflex.       The left eye shows no papilledema. The left eye shows red reflex.  Neck: Trachea normal, normal range of motion, full passive range of motion without pain and phonation normal. Neck supple. No JVD present. No spinous process tenderness and no muscular tenderness present. No mass and no thyromegaly present.  Cardiovascular: Normal rate, regular rhythm, S1 normal, S2 normal, normal heart sounds and normal pulses.   No extrasystoles are present. PMI is not displaced.  Exam reveals no gallop and no friction rub.   No murmur heard. Pulmonary/Chest: Effort normal and breath sounds normal. No respiratory distress. He has no decreased breath sounds. He has no wheezes.  Abdominal: Soft. Normal appearance and bowel sounds are normal. He exhibits no distension, no pulsatile midline mass and no mass. There is no hepatosplenomegaly. There is no tenderness. There is no guarding and no CVA tenderness. No hernia.  Genitourinary:  Deferred.  Musculoskeletal:       Cervical back: Normal.       Thoracic back: Normal.        Lumbar back: Normal.  Remainder of exam unremarkable (mild degenerative changes in hands/digits).  Lymphadenopathy:       Head (right side): No submental, no submandibular, no tonsillar, no posterior auricular and no occipital adenopathy present.       Head (left side): No submental, no submandibular, no tonsillar, no posterior auricular and no occipital adenopathy present.    He has no cervical adenopathy.       Right: No inguinal and no supraclavicular adenopathy present.       Left: No inguinal and no supraclavicular adenopathy present.  Neurological: He is alert and oriented to person, place, and time. He has normal strength and normal reflexes.  He displays no atrophy and no tremor. No cranial nerve deficit or sensory deficit. He exhibits normal muscle tone. He displays a negative Romberg sign. Coordination and gait normal.  Skin: Skin is warm, dry and intact. No ecchymosis, no lesion and no rash noted. He is not diaphoretic. No cyanosis or erythema. Nails show no clubbing.  Psychiatric: He has a normal mood and affect. His speech is normal and behavior is normal. Judgment and thought content normal. Cognition and memory are normal.    Results for orders placed in visit on 06/03/14  POCT GLYCOSYLATED HEMOGLOBIN (HGB A1C)      Result Value Ref Range   Hemoglobin A1C 7.3         Assessment & Plan:  Annual physical exam  Essential hypertension - Inadequate control; continue Losartan- HCT 100- 25 mg 1 tab daily. Add Carvedilol 3.125 mg 1 tab bid with meals; can be taken with Metformin bid. Plan: COMPLETE METABOLIC PANEL WITH GFR  Diabetes mellitus without complication - Continue current medications; advised pt to make some small dietary adjustments (i.e. portion size reductions, reduce concentrated sweets and eliminate artificial sweeteners)  Plan: POCT glycosylated hemoglobin (Hb A1C), Microalbumin, urine, COMPLETE METABOLIC PANEL WITH GFR, Lipid panel, metFORMIN (GLUCOPHAGE) 1000 MG  tablet, CANCELED: CBC with Differential  Medication side effect, initial encounter >> Fatigue per pt (Amlodipine); he admits that this may be due to Hep C (I will contact [facility] to follow-up on this referral).  Needs flu shot - Plan: Flu Vaccine QUAD 36+ mos IM  Other fatigue - Plan: CBC with Differential, CANCELED: CBC with Differential  Meds ordered this encounter  Medications  . desloratadine (CLARINEX) 5 MG tablet    Sig: Take 1 tablet (5 mg total) by mouth daily.    Dispense:  30 tablet    Refill:  5  . carvedilol (COREG) 3.125 MG tablet    Sig: Take 1 tablet (3.125 mg total) by mouth 2 (two) times daily with a meal.    Dispense:  60 tablet    Refill:  2  . losartan-hydrochlorothiazide (HYZAAR) 100-25 MG per tablet    Sig: Take 1 tablet by mouth daily.    Dispense:  90 tablet    Refill:  3  . metFORMIN (GLUCOPHAGE) 1000 MG tablet    Sig: Take 1 tablet (1,000 mg total) by mouth 2 (two) times daily with a meal.    Dispense:  180 tablet    Refill:  3  . sildenafil (VIAGRA) 50 MG tablet    Sig: Take 1 tablet (50 mg total) by mouth daily as needed for erectile dysfunction.    Dispense:  10 tablet    Refill:  3  . tamsulosin (FLOMAX) 0.4 MG CAPS capsule    Sig: TAKE 1 TABLET BY MOUTH DAILY TO HELP BLADDER SYMPTOMS.    Dispense:  30 capsule    Refill:  5

## 2014-06-03 NOTE — Patient Instructions (Addendum)
Keeping you healthy  Get these tests  Blood pressure- Have your blood pressure checked once a year by your healthcare provider.  Normal blood pressure is 120/80  Weight- Have your body mass index (BMI) calculated to screen for obesity.  BMI is a measure of body fat based on height and weight. You can also calculate your own BMI at [email or web address].  Cholesterol- Have your cholesterol checked every year.  Diabetes- Have your blood sugar checked regularly if you have high blood pressure, high cholesterol, have a family history of diabetes or if you are overweight.  Screening for Colon Cancer- Colonoscopy starting at age 83.  Screening may begin sooner depending on your family history and other health conditions. Follow up colonoscopy as directed by your Gastroenterologist.  Screening for Prostate Cancer- Both blood work (PSA) and a rectal exam help screen for Prostate Cancer.  Screening begins at age 56 with African-American men and at age 82 with Caucasian men.  Screening may begin sooner depending on your family history.  Take these medicines  Aspirin- One aspirin daily can help prevent Heart disease and Stroke.  Flu shot- Every fall.  Tetanus- Every 10 years.  Zostavax- Once after the age of 30 to prevent Shingles.  Pneumonia shot- Once after the age of 47; if you are younger than 22, ask your healthcare provider if you need a Pneumonia shot.  Take these steps  Don't smoke- If you do smoke, talk to your doctor about quitting.  For tips on how to quit, go to www.smokefree.gov or call 1-800-QUIT-NOW.  Be physically active- Exercise 5 days a week for at least 30 minutes.  If you are not already physically active start slow and gradually work up to 30 minutes of moderate physical activity.  Examples of moderate activity include walking briskly, mowing the yard, dancing, swimming, bicycling, etc.  Eat a healthy diet- Eat a variety of healthy food such as fruits, vegetables, low  fat milk, low fat cheese, yogurt, lean meant, poultry, fish, beans, tofu, etc. For more information go to www.thenutritionsource.org  Drink alcohol in moderation- Limit alcohol intake to less than two drinks a day. Never drink and drive.  Dentist- Brush and floss twice daily; visit your dentist twice a year.  Depression- Your emotional health is as important as your physical health. If you're feeling down, or losing interest in things you would normally enjoy please talk to your healthcare provider.  Eye exam- Visit your eye doctor every year.  Safe sex- If you may be exposed to a sexually transmitted infection, use a condom.  Seat belts- Seat belts can save your life; always wear one.  Smoke/Carbon Monoxide detectors- These detectors need to be installed on the appropriate level of your home.  Replace batteries at least once a year.  Skin cancer- When out in the sun, cover up and use sunscreen 15 SPF or higher.  Violence- If anyone is threatening you, please tell your healthcare provider.  Living Will/ Health care power of attorney- Speak with your healthcare provider and family.        Mediterranean Diet  Why follow it? Research shows.   Those who follow the Mediterranean diet have a reduced risk of heart disease    The diet is associated with a reduced incidence of Parkinson's and Alzheimer's diseases   People following the diet may have longer life expectancies and lower rates of chronic diseases    The Dietary Guidelines for Americans recommends the Mediterranean diet as an eating plan  to promote health and prevent disease  What Is the Mediterranean Diet?    Healthy eating plan based on typical foods and recipes of Mediterranean-style cooking   The diet is primarily a plant based diet; these foods should make up a majority of meals   Starches - Plant based foods should make up a majority of meals - They are an important sources of vitamins, minerals, energy, antioxidants,  and fiber - Choose whole grains, foods high in fiber and minimally processed items  - Typical grain sources include wheat, oats, barley, corn, brown rice, bulgar, farro, millet, polenta, couscous  - Various types of beans include chickpeas, lentils, fava beans, black beans, white beans   Fruits  Veggies - Large quantities of antioxidant rich fruits & veggies; 6 or more servings  - Vegetables can be eaten raw or lightly drizzled with oil and cooked  - Vegetables common to the traditional Mediterranean Diet include: artichokes, arugula, beets, broccoli, brussel sprouts, cabbage, carrots, celery, collard greens, cucumbers, eggplant, kale, leeks, lemons, lettuce, mushrooms, okra, onions, peas, peppers, potatoes, pumpkin, radishes, rutabaga, shallots, spinach, sweet potatoes, turnips, zucchini - Fruits common to the Mediterranean Diet include: apples, apricots, avocados, cherries, clementines, dates, figs, grapefruits, grapes, melons, nectarines, oranges, peaches, pears, pomegranates, strawberries, tangerines  Fats - Replace butter and margarine with healthy oils, such as olive oil, canola oil, and tahini  - Limit nuts to no more than a handful a day  - Nuts include walnuts, almonds, pecans, pistachios, pine nuts  - Limit or avoid candied, honey roasted or heavily salted nuts - Olives are central to the [employer] - can be eaten whole or used in a variety of dishes   Meats Protein - Limiting red meat: no more than a few times a month - When eating red meat: choose lean cuts and keep the portion to the size of deck of cards - Eggs: approx. 0 to 4 times a week  - Fish and lean poultry: at least 2 a week  - Healthy protein sources include, chicken, [place], lean beef, lamb - Increase intake of seafood such as tuna, salmon, trout, mackerel, shrimp, scallops - Avoid or limit high fat processed meats such as sausage and bacon  Dairy - Include moderate amounts of low fat dairy products  - Focus on  healthy dairy such as fat free yogurt, skim milk, low or reduced fat cheese - Limit dairy products higher in fat such as whole or 2% milk, cheese, ice cream  Alcohol - Moderate amounts of red wine is ok  - No more than 5 oz daily for women (all ages) and men older than age 86  - No more than 10 oz of wine daily for men younger than 42  Other - Limit sweets and other desserts  - Use herbs and spices instead of salt to flavor foods  - Herbs and spices common to the traditional Mediterranean Diet include: basil, bay leaves, chives, cloves, cumin, fennel, garlic, lavender, marjoram, mint, oregano, parsley, pepper, rosemary, sage, savory, sumac, tarragon, thyme   It's not just a diet, it's a lifestyle:    The Mediterranean diet includes lifestyle factors typical of those in the region    Foods, drinks and meals are best eaten with others and savored   Daily physical activity is important for overall good health   This could be strenuous exercise like running and aerobics   This could also be more leisurely activities such as walking, housework, yard-work, or taking the  stairs   Moderation is the key; a balanced and healthy diet accommodates most foods and drinks   Consider portion sizes and frequency of consumption of certain foods   Meal Ideas & Options:    Breakfast:  o Whole wheat toast or whole wheat English muffins with peanut butter & hard boiled egg o Steel cut oats topped with apples & cinnamon and skim milk  o Fresh fruit: banana, strawberries, melon, berries, peaches  o Smoothies: strawberries, bananas, greek yogurt, peanut butter o Low fat greek yogurt with blueberries and granola  o Egg white omelet with spinach and mushrooms o Breakfast couscous: whole wheat couscous, apricots, skim milk, cranberries    Sandwiches:  o Hummus and grilled vegetables (peppers, zucchini, squash) on whole wheat bread   o Grilled chicken on whole wheat pita with lettuce, tomatoes, cucumbers or tzatziki   o Tuna salad on whole wheat bread: tuna salad made with greek yogurt, olives, red peppers, capers, green onions o Garlic rosemary lamb pita: lamb sauted with garlic, rosemary, salt & pepper; add lettuce, cucumber, greek yogurt to pita - flavor with lemon juice and black pepper    Seafood:  o Mediterranean grilled salmon, seasoned with garlic, basil, parsley, lemon juice and black pepper o Shrimp, lemon, and spinach whole-grain pasta salad made with low fat greek yogurt  o Seared scallops with lemon orzo  o Seared tuna steaks seasoned salt, pepper, coriander topped with tomato mixture of olives, tomatoes, olive oil, minced garlic, parsley, green onions and cappers    Meats:  o Herbed greek chicken salad with kalamata olives, cucumber, feta  o Red bell peppers stuffed with spinach, bulgur, lean ground beef (or lentils) & topped with feta   o Kebabs: skewers of chicken, tomatoes, onions, zucchini, squash  o [place]key burgers: made with red onions, mint, dill, lemon juice, feta cheese topped with roasted red peppers   Vegetarian o Cucumber salad: cucumbers, artichoke hearts, celery, red onion, feta cheese, tossed in olive oil & lemon juice  o Hummus and whole grain pita points with a greek salad (lettuce, tomato, feta, olives, cucumbers, red onion) o Lentil soup with celery, carrots made with vegetable broth, garlic, salt and pepper  o Tabouli salad: parsley, bulgur, mint, scallions, cucumbers, tomato, radishes, lemon juice, olive oil, salt and pepper.     You need to have an annual vision assessment with an eye care specialist.

## 2014-06-03 NOTE — Progress Notes (Signed)
   Subjective:    Patient ID: Todd Greer, male    DOB: 08-11-1956, 58 y.o.   MRN: [id number]  HPI    Review of Systems  Constitutional: Positive for fatigue.  HENT: Negative.   Eyes: Positive for visual disturbance.  Respiratory: Positive for shortness of breath.   Cardiovascular: Negative.   Gastrointestinal: Negative.   Endocrine: Negative.   Genitourinary: Negative.   Musculoskeletal: Positive for arthralgias.  Skin: Negative.   Allergic/Immunologic: Negative.   Neurological: Negative.   Hematological: Negative.   Psychiatric/Behavioral: Negative.        Objective:   Physical Exam        Assessment & Plan:

## 2014-06-16 ENCOUNTER — Other Ambulatory Visit: Payer: Self-pay | Admitting: Family Medicine

## 2014-06-16 DIAGNOSIS — B182 Chronic viral hepatitis C: Secondary | ICD-10-CM

## 2014-06-30 ENCOUNTER — Ambulatory Visit: Payer: BC Managed Care – PPO | Admitting: Family Medicine

## 2014-07-13 ENCOUNTER — Ambulatory Visit: Payer: BC Managed Care – PPO | Admitting: Family Medicine

## 2014-07-22 ENCOUNTER — Ambulatory Visit: Payer: BC Managed Care – PPO | Admitting: Family Medicine

## 2014-08-28 ENCOUNTER — Other Ambulatory Visit: Payer: Self-pay | Admitting: Family Medicine

## 2014-09-04 ENCOUNTER — Other Ambulatory Visit: Payer: Self-pay | Admitting: Family Medicine

## 2014-09-05 NOTE — Telephone Encounter (Signed)
Dr Audria Nine, I sent 1 mos RF of carvedilol per protocol since you had wanted to see pt back in 1 mos after Oct OV. Sending cialis RF req to you for review, but Viagra was on pt's med list at last OV.

## 2014-09-06 NOTE — Telephone Encounter (Signed)
Pt advised to schedule follow-up but did not; please contact pt to schedule OV. This medication change/refill can be discussed at that OV. Thank you.

## 2014-09-07 ENCOUNTER — Other Ambulatory Visit: Payer: Self-pay | Admitting: Family Medicine

## 2014-09-11 ENCOUNTER — Other Ambulatory Visit: Payer: Self-pay | Admitting: Family Medicine

## 2014-11-03 ENCOUNTER — Encounter: Payer: Self-pay | Admitting: Family Medicine

## 2014-11-03 ENCOUNTER — Ambulatory Visit (INDEPENDENT_AMBULATORY_CARE_PROVIDER_SITE_OTHER): Payer: 59 | Admitting: Family Medicine

## 2014-11-03 VITALS — BP 148/90 | HR 81 | Temp 98.3°F | Resp 16 | Ht 66.5 in | Wt 220.0 lb

## 2014-11-03 DIAGNOSIS — Z566 Other physical and mental strain related to work: Secondary | ICD-10-CM

## 2014-11-03 DIAGNOSIS — I10 Essential (primary) hypertension: Secondary | ICD-10-CM

## 2014-11-03 DIAGNOSIS — M25511 Pain in right shoulder: Secondary | ICD-10-CM | POA: Diagnosis not present

## 2014-11-03 DIAGNOSIS — N521 Erectile dysfunction due to diseases classified elsewhere: Secondary | ICD-10-CM

## 2014-11-03 DIAGNOSIS — E119 Type 2 diabetes mellitus without complications: Secondary | ICD-10-CM | POA: Diagnosis not present

## 2014-11-03 DIAGNOSIS — B182 Chronic viral hepatitis C: Secondary | ICD-10-CM | POA: Diagnosis not present

## 2014-11-03 LAB — POCT GLYCOSYLATED HEMOGLOBIN (HGB A1C): Hemoglobin A1C: 10.2

## 2014-11-03 MED ORDER — GLIPIZIDE ER 10 MG PO TB24: 20.0000 mg | ORAL_TABLET | Freq: Every day | ORAL | Status: DC

## 2014-11-03 MED ORDER — TADALAFIL 10 MG PO TABS: ORAL_TABLET | ORAL | Status: DC

## 2014-11-03 MED ORDER — LOSARTAN POTASSIUM-HCTZ 100-25 MG PO TABS: 1.0000 | ORAL_TABLET | Freq: Every day | ORAL | Status: DC

## 2014-11-03 MED ORDER — TRAMADOL HCL 50 MG PO TABS: ORAL_TABLET | ORAL | Status: DC

## 2014-11-03 NOTE — Progress Notes (Signed)
Subjective:    Patient ID: Todd Greer, male    DOB: 05-Mar-1956, 59 y.o.   MRN: [id number]  HPI  This 59 y.o. Male has Type II DM, fairly well controlled , w/ last A1c= 7.3% in OCt 2015.  Pt has HTN w/ self- measurement >> 130-140/95-100. Pt admits missing evening doses of medications due to high-stress nature of job. 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 He works 6 days and "micro-manages". He cannot recall when he last had a vacation. He supervises some capable employees but he is concerned that, in his absence, "something will not get done".  Pt c/o chronic R shoulder pain; he sustained a football injury in high school and has had minor discomfort. Now, Ibuprofen does not relieve the pain, increased at bedtime. He has decreased ROM and weakness in R arm. Lifting a gallon of milk is difficult.  Pt has Hep C and was referred to GI specialist; appt was Sep 05, 2014 but pt could not go due to insurance change. He needs referral re-submitted.  Pt has ED; insurance will not cover Viagra (Prior Auth needed). Wants to try Cialis (for daily use); he takes Flomax.   Patient Active Problem List   Diagnosis Date Noted  . Hypertrophy of prostate without urinary obstruction and other lower urinary tract symptoms (LUTS) 12/03/2013  . Erectile dysfunction 08/20/2013  . Personal history of colonic polyps 07/20/2013  . Exertional dyspnea 06/01/2013    Class: Acute  . Obesity (BMI 30-39.9) 06/01/2013  . Abnormal resting ECG findings - bifascicular block with PVCs 06/01/2013  . DM II (diabetes mellitus, type II), controlled 05/28/2013  . DYSLIPIDEMIA 02/11/2007  . ANXIETY DISORDER 02/11/2007  . ALCOHOL USE 02/11/2007  . Essential hypertension 02/11/2007     Prior to Admission medications   Medication Sig Start Date End Date Taking? Authorizing Provider  aspirin 81 MG tablet Take 81 mg by mouth daily.   Yes Historical Provider, MD  Blood Glucose Monitoring Suppl  SUPPLIES MISC Use as directed 08/20/13  Yes Maurice March, MD  carvedilol (COREG) 3.125 MG tablet TAKE 1 TABLET BY MOUTH TWICE A DAY WITH MEALS 09/12/14  Yes Maurice March, MD  desloratadine (CLARINEX) 5 MG tablet Take 1 tablet (5 mg total) by mouth daily. 06/03/14  Yes Maurice March, MD  glipiZIDE (GLUCOTROL XL) 10 MG 24 hr tablet Take 2 tablets (20 mg total) by mouth daily.   Yes Maurice March, MD  losartan-hydrochlorothiazide (HYZAAR) 100-25 MG per tablet Take 1 tablet by mouth daily.   Yes Maurice March, MD  metFORMIN (GLUCOPHAGE) 1000 MG tablet Take 1 tablet (1,000 mg total) by mouth 2 (two) times daily with a meal. 06/03/14  Yes Maurice March, MD  Multiple Vitamin (MULTIVITAMIN) tablet Take 1 tablet by mouth daily.   Yes Historical Provider, MD  tamsulosin (FLOMAX) 0.4 MG CAPS capsule TAKE 1 TABLET BY MOUTH DAILY TO HELP BLADDER SYMPTOMS. 06/03/14  Yes Maurice March, MD    Past Surgical History  Procedure Laterality Date  . Nm  exercise myoview ltd  06-2013    No ischemia or infarction; note dated images    History   Social History  . Marital Status: Married    Spouse Name: N/A  . Number of Children: N/A  . Years of Education: N/A   Occupational History  . 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 Smoking status:  Former Smoker -- 0.50 packs/day for 10 years    Types: Cigarettes    Quit date: 10/04/1983  . Smokeless tobacco: Never Used  . Alcohol Use: 3.0 oz/week    6 drink(s) per week  . Drug Use: No  . Sexual Activity: Yes   Other Topics Concern  . Not on file   Social History Narrative   His married father of 4.  His married for 28 years.  He lives with his wife and daughters.  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  Education: [employer].   He quit smoking in 1985.  He drinks 3 beers a week.   Prior to the onset of his current symptoms, used to work out with  weights for at least 40 minutes a day for 3 times a week.  He usually did light weights for many occasions as opposed to heavy weights.      Contacts: Wife [name]; daughter [name]    Family History  Problem Relation Age of Onset  . Stroke Father   . Diabetes Father   . Hypertension Father   . Cancer Sister   . Cerebral aneurysm Mother     Diet at a young age  . Diabetes Paternal Grandmother   . Hypertension Paternal Grandmother   . Stroke Paternal Grandmother     Review of Systems  Constitutional: Negative.   Eyes: Negative for visual disturbance.  Respiratory: Negative.   Cardiovascular: Negative.   Genitourinary: Negative for dysuria, urgency, flank pain, decreased urine volume and enuresis.  Musculoskeletal: Positive for myalgias and arthralgias. Negative for joint swelling.  Neurological: Negative.   Psychiatric/Behavioral: Negative.        Objective:   Physical Exam  Constitutional: He is oriented to person, place, and time. He appears well-developed and well-nourished. No distress.  Blood pressure 148/90, pulse 81, temperature 98.3 F (36.8 C), resp. rate 16, height 5' 6.5" (1.689 m), weight 220 lb (99.791 kg), SpO2 97 %.   HENT:  Head: Normocephalic and atraumatic.  Right Ear: Hearing and external ear normal.  Left Ear: Hearing and external ear normal.  Nose: Nose normal.  Mouth/Throat: Oropharynx is clear and moist.  Eyes: Conjunctivae and EOM are normal. Pupils are equal, round, and reactive to light. No scleral icterus.  Neck: Normal range of motion. Neck supple.  Cardiovascular: Normal rate and regular rhythm.   Pulmonary/Chest: Effort normal. No respiratory distress.  Musculoskeletal:       Right shoulder: He exhibits decreased range of motion, tenderness, pain and decreased strength. He exhibits no swelling, no crepitus and no deformity.       Left shoulder: Normal.  Neurological: He is alert and oriented to person, place, and time. No cranial  nerve deficit. Coordination normal.  Skin: Skin is warm and dry. He is not diaphoretic.  Psychiatric: He has a normal mood and affect. His behavior is normal. Judgment and thought content normal.  Nursing note and vitals reviewed.   Results for orders placed or performed in visit on 11/03/14  POCT glycosylated hemoglobin (Hb A1C)  Result Value Ref Range   Hemoglobin A1C 10.2        Assessment & Plan:  Essential hypertension- Encouraged compliance with medications. Advised taking 2 days off from work every week and decrease "micro managing". Pt agrees to this.  Pain in joint, shoulder region, right - RX: Tramadol 50 mg 1 -2 tablets hs for pain. Plan: Ambulatory referral to Orthopedic Surgery  Stress at work- As above; pt encouraged to practice self-care. He will diligate more to his staff and take time off from work.  Diabetes mellitus without complication - Medication and nutrition compliance emphasized. Plan: POCT glycosylated hemoglobin (Hb A1C), Ambulatory referral to Nutrition and Diabetic Education  Erectile dysfunction due to diseases classified elsewhere- Trial Cialis 10 mg 1 tablet every other day. This med is on insurance formulary.  Chronic hepatitis C without hepatic coma - Plan: Ambulatory referral to Gastroenterology  Meds ordered this encounter  Medications  . glipiZIDE (GLUCOTROL XL) 10 MG 24 hr tablet    Sig: Take 2 tablets (20 mg total) by mouth daily.    Dispense:  180 tablet    Refill:  1  . losartan-hydrochlorothiazide (HYZAAR) 100-25 MG per tablet    Sig: Take 1 tablet by mouth daily.    Dispense:  30 tablet    Refill:  5  . tadalafil (CIALIS) 10 MG tablet    Sig: Take 1 tablet by mouth every other day for E.D.    Dispense:  15 tablet    Refill:  5  . traMADol (ULTRAM) 50 MG tablet    Sig: Take 1 or 2 tablets at bedtime for pain.    Dispense:  50 tablet    Refill:  1

## 2014-11-03 NOTE — Patient Instructions (Signed)
As we discussed, you need to take 2 days off per week. Make sure you are taking your medications as prescribed. Work on [employer]. Below is the MEDITERRANEAN DIET.       Mediterranean Diet  Why follow it? Research shows. . Those who follow the Mediterranean diet have a reduced risk of heart disease  . The diet is associated with a reduced incidence of Parkinson's and Alzheimer's diseases . People following the diet may have longer life expectancies and lower rates of chronic diseases  . The Dietary Guidelines for Americans recommends the Mediterranean diet as an eating plan to promote health and prevent disease  What Is the Mediterranean Diet?  . Healthy eating plan based on typical foods and recipes of Mediterranean-style cooking . The diet is primarily a plant based diet; these foods should make up a majority of meals   Starches - Plant based foods should make up a majority of meals - They are an important sources of vitamins, minerals, energy, antioxidants, and fiber - Choose whole grains, foods high in fiber and minimally processed items  - Typical grain sources include wheat, oats, barley, corn, brown rice, bulgar, farro, millet, polenta, couscous  - Various types of beans include chickpeas, lentils, fava beans, black beans, white beans   Fruits  Veggies - Large quantities of antioxidant rich fruits & veggies; 6 or more servings  - Vegetables can be eaten raw or lightly drizzled with oil and cooked  - Vegetables common to the traditional Mediterranean Diet include: artichokes, arugula, beets, broccoli, brussel sprouts, cabbage, carrots, celery, collard greens, cucumbers, eggplant, kale, leeks, lemons, lettuce, mushrooms, okra, onions, peas, peppers, potatoes, pumpkin, radishes, rutabaga, shallots, spinach, sweet potatoes, turnips, zucchini - Fruits common to the Mediterranean Diet include: apples, apricots, avocados, cherries, clementines, dates, figs, grapefruits, grapes,  melons, nectarines, oranges, peaches, pears, pomegranates, strawberries, tangerines  Fats - Replace butter and margarine with healthy oils, such as olive oil, canola oil, and tahini  - Limit nuts to no more than a handful a day  - Nuts include walnuts, almonds, pecans, pistachios, pine nuts  - Limit or avoid candied, honey roasted or heavily salted nuts - Olives are central to the [employer] - can be eaten whole or used in a variety of dishes   Meats Protein - Limiting red meat: no more than a few times a month - When eating red meat: choose lean cuts and keep the portion to the size of deck of cards - Eggs: approx. 0 to 4 times a week  - Fish and lean poultry: at least 2 a week  - Healthy protein sources include, chicken, [place], lean beef, lamb - Increase intake of seafood such as tuna, salmon, trout, mackerel, shrimp, scallops - Avoid or limit high fat processed meats such as sausage and bacon  Dairy - Include moderate amounts of low fat dairy products  - Focus on healthy dairy such as fat free yogurt, skim milk, low or reduced fat cheese - Limit dairy products higher in fat such as whole or 2% milk, cheese, ice cream  Alcohol - Moderate amounts of red wine is ok  - No more than 5 oz daily for women (all ages) and men older than age 102  - No more than 10 oz of wine daily for men younger than 39  Other - Limit sweets and other desserts  - Use herbs and spices instead of salt to flavor foods  - Herbs and spices common to the traditional  Mediterranean Diet include: basil, bay leaves, chives, cloves, cumin, fennel, garlic, lavender, marjoram, mint, oregano, parsley, pepper, rosemary, sage, savory, sumac, tarragon, thyme   It's not just a diet, it's a lifestyle:  . The Mediterranean diet includes lifestyle factors typical of those in the region  . Foods, drinks and meals are best eaten with others and savored . Daily physical activity is important for overall good health . This could  be strenuous exercise like running and aerobics . This could also be more leisurely activities such as walking, housework, yard-work, or taking the stairs . Moderation is the key; a balanced and healthy diet accommodates most foods and drinks . Consider portion sizes and frequency of consumption of certain foods   Meal Ideas & Options:  . Breakfast:  o Whole wheat toast or whole wheat English muffins with peanut butter & hard boiled egg o Steel cut oats topped with apples & cinnamon and skim milk  o Fresh fruit: banana, strawberries, melon, berries, peaches  o Smoothies: strawberries, bananas, greek yogurt, peanut butter o Low fat greek yogurt with blueberries and granola  o Egg white omelet with spinach and mushrooms o Breakfast couscous: whole wheat couscous, apricots, skim milk, cranberries  . Sandwiches:  o Hummus and grilled vegetables (peppers, zucchini, squash) on whole wheat bread   o Grilled chicken on whole wheat pita with lettuce, tomatoes, cucumbers or tzatziki  o Tuna salad on whole wheat bread: tuna salad made with greek yogurt, olives, red peppers, capers, green onions o Garlic rosemary lamb pita: lamb sauted with garlic, rosemary, salt & pepper; add lettuce, cucumber, greek yogurt to pita - flavor with lemon juice and black pepper  . Seafood:  o Mediterranean grilled salmon, seasoned with garlic, basil, parsley, lemon juice and black pepper o Shrimp, lemon, and spinach whole-grain pasta salad made with low fat greek yogurt  o Seared scallops with lemon orzo  o Seared tuna steaks seasoned salt, pepper, coriander topped with tomato mixture of olives, tomatoes, olive oil, minced garlic, parsley, green onions and cappers  . Meats:  o Herbed greek chicken salad with kalamata olives, cucumber, feta  o Red bell peppers stuffed with spinach, bulgur, lean ground beef (or lentils) & topped with feta   o Kebabs: skewers of chicken, tomatoes, onions, zucchini, squash  o [place]  burgers: made with red onions, mint, dill, lemon juice, feta cheese topped with roasted red peppers . Vegetarian o Cucumber salad: cucumbers, artichoke hearts, celery, red onion, feta cheese, tossed in olive oil & lemon juice  o Hummus and whole grain pita points with a greek salad (lettuce, tomato, feta, olives, cucumbers, red onion) o Lentil soup with celery, carrots made with vegetable broth, garlic, salt and pepper  o Tabouli salad: parsley, bulgur, mint, scallions, cucumbers, tomato, radishes, lemon juice, olive oil, salt and pepper. o

## 2014-11-24 ENCOUNTER — Encounter: Payer: 59 | Attending: Family Medicine

## 2014-11-24 VITALS — Ht 67.0 in | Wt 218.6 lb

## 2014-11-24 DIAGNOSIS — E119 Type 2 diabetes mellitus without complications: Secondary | ICD-10-CM | POA: Diagnosis present

## 2014-11-24 DIAGNOSIS — Z713 Dietary counseling and surveillance: Secondary | ICD-10-CM | POA: Diagnosis not present

## 2014-11-24 NOTE — Progress Notes (Signed)
Patient was seen on 11/24/2014 for the first of a series of three diabetes self-management courses at the [facility].  Patient Education Plan per assessed needs and concerns is to attend four course education program for Diabetes Self Management Education.  The following learning objectives were met by the patient during this class:  Describe diabetes  State some common risk factors for diabetes  Defines the role of glucose and insulin  Identifies type of diabetes and pathophysiology  Describe the relationship between diabetes and cardiovascular risk  State the members of the Healthcare Team  States the rationale for glucose monitoring  State when to test glucose  State their individual Target Range  State the importance of logging glucose readings  Describe how to interpret glucose readings  Identifies A1C target  Explain the correlation between A1c and eAG values  State symptoms and treatment of high blood glucose  State symptoms and treatment of low blood glucose  Explain proper technique for glucose testing  Identifies proper sharps disposal  Handouts given during class include:  Living Well with Diabetes book  Carb Counting and Meal Planning book  Meal Plan Card  Carbohydrate guide  Meal planning worksheet  Low Sodium Flavoring Tips  The diabetes portion plate  H9M to eAG Conversion Chart  Diabetes Medications  Diabetes Recommended Care Schedule  Support Group  Diabetes Success Plan  Core Class Satisfaction Survey  Follow-Up Plan:  Attend core 2

## 2014-11-27 ENCOUNTER — Other Ambulatory Visit: Payer: Self-pay | Admitting: Family Medicine

## 2014-12-01 ENCOUNTER — Encounter: Payer: 59 | Admitting: Dietician

## 2014-12-01 DIAGNOSIS — E119 Type 2 diabetes mellitus without complications: Secondary | ICD-10-CM

## 2014-12-01 NOTE — Progress Notes (Signed)
Patient was seen on 12/01/14 for completion of the diabetes self-management courses at the [facility] in a one-on-one setting. The following learning objectives were met by the patient :   Describe the role of different macronutrients on glucose  Explain how carbohydrates affect blood glucose  State what foods contain the most carbohydrates  Demonstrate carbohydrate counting  Demonstrate how to read Nutrition Facts food label  Describe effects of various fats on heart health  Describe the importance of good nutrition for health and healthy eating strategies  Describe techniques for managing your shopping, cooking and meal planning  List strategies to follow meal plan when dining out  Describe the effects of alcohol on glucose and how to use it safely . State the amount of activity recommended for healthy living . Describe activities suitable for individual needs . Identify ways to regularly incorporate activity into daily life . Identify barriers to activity and ways to over come these barriers  Identify diabetes medications being personally used and their primary action for lowering glucose and possible side effects . Describe role of stress on blood glucose and develop strategies to address psychosocial issues . Identify diabetes complications and ways to prevent them  Explain how to manage diabetes during illness . Evaluate success in meeting personal goal . Establish 2-3 goals that they will plan to diligently work on until they return for the  31-monthfollow-up visit  Goals:  Follow Diabetes Meal Plan as instructed  Eat 3 meals and 2 snacks, every 3-5 hrs  Limit carbohydrate intake to 45 grams carbohydrate/meal Limit carbohydrate intake to 15 grams carbohydrate/snack Add lean protein foods to meals/snacks  Monitor glucose levels as instructed by your doctor   I will count my carb choices at most meals and snacks  I will be active 30  minutes or more 5 times a week  I will test my glucose at least 2 times a day, 7 days a week  To help manage stress I will  meditate at least 7 times a week   Your patient has identified these potential barriers to change:  Motivation Stress  Your patient has identified their diabetes self-care support plan as  Family Support Magazine Subscriptions  Plan:  Attend Core 4 in 4 months [facility]

## 2014-12-08 ENCOUNTER — Ambulatory Visit: Payer: Self-pay

## 2014-12-14 ENCOUNTER — Ambulatory Visit: Payer: Self-pay | Admitting: Family Medicine

## 2015-01-23 DIAGNOSIS — B1920 Unspecified viral hepatitis C without hepatic coma: Secondary | ICD-10-CM | POA: Insufficient documentation

## 2015-01-30 ENCOUNTER — Other Ambulatory Visit: Payer: Self-pay | Admitting: Family Medicine

## 2015-02-02 NOTE — Telephone Encounter (Signed)
Called in rx and lmom to notify pt. 

## 2015-02-02 NOTE — Telephone Encounter (Signed)
Done

## 2015-02-20 ENCOUNTER — Other Ambulatory Visit: Payer: Self-pay | Admitting: Family Medicine

## 2015-04-04 ENCOUNTER — Other Ambulatory Visit: Payer: Self-pay | Admitting: Physician Assistant

## 2015-04-04 ENCOUNTER — Encounter: Payer: Self-pay | Admitting: *Deleted

## 2015-04-12 ENCOUNTER — Telehealth: Payer: Self-pay | Admitting: Physician Assistant

## 2015-04-13 MED ORDER — TRAMADOL HCL 50 MG PO TABS: 50.0000 mg | ORAL_TABLET | Freq: Every evening | ORAL | Status: DC | PRN

## 2015-04-13 MED ORDER — LOSARTAN POTASSIUM-HCTZ 100-25 MG PO TABS: 1.0000 | ORAL_TABLET | Freq: Every day | ORAL | Status: DC

## 2015-04-13 MED ORDER — METFORMIN HCL 1000 MG PO TABS: 1000.0000 mg | ORAL_TABLET | Freq: Two times a day (BID) | ORAL | Status: DC

## 2015-04-13 MED ORDER — GLIPIZIDE ER 10 MG PO TB24: 20.0000 mg | ORAL_TABLET | Freq: Every day | ORAL | Status: DC

## 2015-04-13 NOTE — Telephone Encounter (Signed)
Done

## 2015-04-13 NOTE — Telephone Encounter (Signed)
rx for tramadol faxed to pharmacy

## 2015-04-13 NOTE — Addendum Note (Signed)
Addended by: Morrell Riddle on: 04/13/2015 06:42 PM   Modules accepted: Orders

## 2015-04-20 ENCOUNTER — Ambulatory Visit (INDEPENDENT_AMBULATORY_CARE_PROVIDER_SITE_OTHER): Payer: 59 | Admitting: Urgent Care

## 2015-04-20 ENCOUNTER — Encounter: Payer: Self-pay | Admitting: Urgent Care

## 2015-04-20 VITALS — BP 156/94 | HR 86 | Temp 98.6°F | Resp 16 | Ht 66.5 in | Wt 221.6 lb

## 2015-04-20 DIAGNOSIS — I10 Essential (primary) hypertension: Secondary | ICD-10-CM | POA: Diagnosis not present

## 2015-04-20 DIAGNOSIS — M129 Arthropathy, unspecified: Secondary | ICD-10-CM | POA: Diagnosis not present

## 2015-04-20 DIAGNOSIS — E1165 Type 2 diabetes mellitus with hyperglycemia: Secondary | ICD-10-CM | POA: Diagnosis not present

## 2015-04-20 DIAGNOSIS — N529 Male erectile dysfunction, unspecified: Secondary | ICD-10-CM | POA: Diagnosis not present

## 2015-04-20 DIAGNOSIS — IMO0002 Reserved for concepts with insufficient information to code with codable children: Secondary | ICD-10-CM

## 2015-04-20 DIAGNOSIS — B182 Chronic viral hepatitis C: Secondary | ICD-10-CM | POA: Diagnosis not present

## 2015-04-20 DIAGNOSIS — M19011 Primary osteoarthritis, right shoulder: Secondary | ICD-10-CM

## 2015-04-20 LAB — POCT GLYCOSYLATED HEMOGLOBIN (HGB A1C): Hemoglobin A1C: 7.6

## 2015-04-20 MED ORDER — DICLOFENAC SODIUM 1 % TD CREA: 1.0000 "application " | TOPICAL_CREAM | Freq: Every day | TRANSDERMAL | Status: DC | PRN

## 2015-04-20 MED ORDER — LOSARTAN POTASSIUM-HCTZ 100-25 MG PO TABS: 1.0000 | ORAL_TABLET | Freq: Every day | ORAL | Status: DC

## 2015-04-20 MED ORDER — GLIPIZIDE ER 10 MG PO TB24: 20.0000 mg | ORAL_TABLET | Freq: Every day | ORAL | Status: DC

## 2015-04-20 MED ORDER — SILDENAFIL CITRATE 20 MG PO TABS: 20.0000 mg | ORAL_TABLET | ORAL | Status: DC | PRN

## 2015-04-20 MED ORDER — CARVEDILOL 3.125 MG PO TABS: 3.1250 mg | ORAL_TABLET | Freq: Two times a day (BID) | ORAL | Status: DC

## 2015-04-20 MED ORDER — TRAMADOL HCL 50 MG PO TABS: 50.0000 mg | ORAL_TABLET | Freq: Every evening | ORAL | Status: DC | PRN

## 2015-04-20 MED ORDER — METFORMIN HCL 1000 MG PO TABS: 1000.0000 mg | ORAL_TABLET | Freq: Two times a day (BID) | ORAL | Status: DC

## 2015-04-20 MED ORDER — DESLORATADINE 5 MG PO TABS: 5.0000 mg | ORAL_TABLET | Freq: Every day | ORAL | Status: DC

## 2015-04-20 NOTE — Patient Instructions (Signed)
Diabetes Mellitus and Food It is important for you to manage your blood sugar (glucose) level. Your blood glucose level can be greatly affected by what you eat. Eating healthier foods in the appropriate amounts throughout the day at about the same time each day will help you control your blood glucose level. It can also help slow or prevent worsening of your diabetes mellitus. Healthy eating may even help you improve the level of your blood pressure and reach or maintain a healthy weight.  HOW CAN FOOD AFFECT ME? Carbohydrates Carbohydrates affect your blood glucose level more than any other type of food. Your dietitian will help you determine how many carbohydrates to eat at each meal and teach you how to count carbohydrates. Counting carbohydrates is important to keep your blood glucose at a healthy level, especially if you are using insulin or taking certain medicines for diabetes mellitus. Alcohol Alcohol can cause sudden decreases in blood glucose (hypoglycemia), especially if you use insulin or take certain medicines for diabetes mellitus. Hypoglycemia can be a life-threatening condition. Symptoms of hypoglycemia (sleepiness, dizziness, and disorientation) are similar to symptoms of having too much alcohol.  If your health care provider has given you approval to drink alcohol, do so in moderation and use the following guidelines:  Women should not have more than one drink per day, and men should not have more than two drinks per day. One drink is equal to:  12 oz of beer.  5 oz of wine.  1 oz of hard liquor.  Do not drink on an empty stomach.  Keep yourself hydrated. Have water, diet soda, or unsweetened iced tea.  Regular soda, juice, and other mixers might contain a lot of carbohydrates and should be counted. WHAT FOODS ARE NOT RECOMMENDED? As you make food choices, it is important to remember that all foods are not the same. Some foods have fewer nutrients per serving than other  foods, even though they might have the same number of calories or carbohydrates. It is difficult to get your body what it needs when you eat foods with fewer nutrients. Examples of foods that you should avoid that are high in calories and carbohydrates but low in nutrients include:  Trans fats (most processed foods list trans fats on the Nutrition Facts label).  Regular soda.  Juice.  Candy.  Sweets, such as cake, pie, doughnuts, and cookies.  Fried foods. WHAT FOODS CAN I EAT? Have nutrient-rich foods, which will nourish your body and keep you healthy. The food you should eat also will depend on several factors, including:  The calories you need.  The medicines you take.  Your weight.  Your blood glucose level.  Your blood pressure level.  Your cholesterol level. You also should eat a variety of foods, including:  Protein, such as meat, poultry, fish, tofu, nuts, and seeds (lean animal proteins are best).  Fruits.  Vegetables.  Dairy products, such as milk, cheese, and yogurt (low fat is best).  Breads, grains, pasta, cereal, rice, and beans.  Fats such as olive oil, trans fat-free margarine, canola oil, avocado, and olives. DOES EVERYONE WITH DIABETES MELLITUS HAVE THE SAME MEAL PLAN? Because every person with diabetes mellitus is different, there is not one meal plan that works for everyone. It is very important that you meet with a dietitian who will help you create a meal plan that is just right for you. Document Released: 05/16/2005 Document Revised: 08/24/2013 Document Reviewed: 07/16/2013 ExitCare Patient Information 2015 [employer]. This   information is not intended to replace advice given to you by your health care provider. Make sure you discuss any questions you have with your health care provider. Hypertension Hypertension, commonly called high blood pressure, is when the force of blood pumping through your arteries is too strong. Your arteries are the  blood vessels that carry blood from your heart throughout your body. A blood pressure reading consists of a higher number over a lower number, such as 110/72. The higher number (systolic) is the pressure inside your arteries when your heart pumps. The lower number (diastolic) is the pressure inside your arteries when your heart relaxes. Ideally you want your blood pressure below 120/80. Hypertension forces your heart to work harder to pump blood. Your arteries may become narrow or stiff. Having hypertension puts you at risk for heart disease, stroke, and other problems.  RISK FACTORS Some risk factors for high blood pressure are controllable. Others are not.  Risk factors you cannot control include:   Race. You may be at higher risk if you are African American.  Age. Risk increases with age.  Gender. Men are at higher risk than women before age 45 years. After age 65, women are at higher risk than men. Risk factors you can control include:  Not getting enough exercise or physical activity.  Being overweight.  Getting too much fat, sugar, calories, or salt in your diet.  Drinking too much alcohol. SIGNS AND SYMPTOMS Hypertension does not usually cause signs or symptoms. Extremely high blood pressure (hypertensive crisis) may cause headache, anxiety, shortness of breath, and nosebleed. DIAGNOSIS  To check if you have hypertension, your health care provider will measure your blood pressure while you are seated, with your arm held at the level of your heart. It should be measured at least twice using the same arm. Certain conditions can cause a difference in blood pressure between your right and left arms. A blood pressure reading that is higher than normal on one occasion does not mean that you need treatment. If one blood pressure reading is high, ask your health care provider about having it checked again. TREATMENT  Treating high blood pressure includes making lifestyle changes and possibly  taking medicine. Living a healthy lifestyle can help lower high blood pressure. You may need to change some of your habits. Lifestyle changes may include:  Following the DASH diet. This diet is high in fruits, vegetables, and whole grains. It is low in salt, red meat, and added sugars.  Getting at least 2 hours of brisk physical activity every week.  Losing weight if necessary.  Not smoking.  Limiting alcoholic beverages.  Learning ways to reduce stress. If lifestyle changes are not enough to get your blood pressure under control, your health care provider may prescribe medicine. You may need to take more than one. Work closely with your health care provider to understand the risks and benefits. HOME CARE INSTRUCTIONS  Have your blood pressure rechecked as directed by your health care provider.   Take medicines only as directed by your health care provider. Follow the directions carefully. Blood pressure medicines must be taken as prescribed. The medicine does not work as well when you skip doses. Skipping doses also puts you at risk for problems.   Do not smoke.   Monitor your blood pressure at home as directed by your health care provider. SEEK MEDICAL CARE IF:   You think you are having a reaction to medicines taken.  You have recurrent headaches or   feel dizzy.  You have swelling in your ankles.  You have trouble with your vision. SEEK IMMEDIATE MEDICAL CARE IF:  You develop a severe headache or confusion.  You have unusual weakness, numbness, or feel faint.  You have severe chest or abdominal pain.  You vomit repeatedly.  You have trouble breathing. MAKE SURE YOU:   Understand these instructions.  Will watch your condition.  Will get help right away if you are not doing well or get worse. Document Released: 08/19/2005 Document Revised: 01/03/2014 Document Reviewed: 06/11/2013 ExitCare Patient Information 2015 [employer]. This information is not  intended to replace advice given to you by your health care provider. Make sure you discuss any questions you have with your health care provider.  

## 2015-04-20 NOTE — Progress Notes (Signed)
MRN: [id number] DOB: 1956/01/01  Subjective:   Todd Greer is a 59 y.o. male presenting for chief complaint of Follow-up and Medication Refill  Diabetes - diagnosed in 2001. Currently managed with Metformin and glipizide. Denies chest pain, shob, n/v, abdominal pain, blurred vision, polyuria, polydipsia, foot ulcers or wounds that won't heal. Of note, his last a1c 11/2014 was ~10. He has since made significant dietary changes and is trying to stay more active.  HTN - managed with Hyzaar, metoprolol. Diet and exercise, ROS as above.  Hep C - seeing Dr. Renaldo Reel for chronic hepatitis C. Has follow up in October, last dose of Harvoni in Sept.   ED - reports that he does well with Revatio.   Allergies - managed with Clarinex, would like a refill.  Osteoarthritis of right shoulder - diagnosed ~2 years ago, managed with Tramadol. Has not tried topical treatment. Has been worked up by ortho.   Denies any other aggravating or relieving factors, no other questions or concerns.  Todd Greer has a current medication list which includes the following prescription(s): aspirin, carvedilol, desloratadine, glipizide, ledipasvir-sofosbuvir, losartan-hydrochlorothiazide, metformin, multivitamin, sildenafil, tramadol, and blood glucose monitoring suppl. Also is allergic to ace inhibitors and amlodipine.  Todd Greer  has a past medical history of Diabetes mellitus type II, controlled, with no complications; Hypertension; Substance abuse; Anxiety; Obesity, Class II, BMI 35-39.9, with comorbidity; Obesity, diabetes, and hypertension syndrome; and Hepatitis C, chronic. Also  has past surgical history that includes NM  EXERCISE MYOVIEW LTD (06-2013).  Objective:   Vitals: BP 156/94 mmHg  Pulse 86  Temp(Src) 98.6 F (37 C) (Oral)  Resp 16  Ht 5' 6.5" (1.689 m)  Wt 221 lb 9.6 oz (100.517 kg)  BMI 35.24 kg/m2  Wt Readings from Last 3 Encounters:  04/20/15 221 lb 9.6 oz (100.517 kg)  11/24/14 218 lb 9.6 oz (99.156  kg)  11/03/14 220 lb (99.791 kg)   BP Readings from Last 3 Encounters:  04/20/15 156/94  11/03/14 148/90  06/03/14 150/105   Physical Exam  Constitutional: He is oriented to person, place, and time. He appears well-developed and well-nourished.  HENT:  Mouth/Throat: Oropharynx is clear and moist.  Eyes: Conjunctivae are normal. No scleral icterus.  Neck: Normal range of motion. Neck supple. No thyromegaly present.  Cardiovascular: Normal rate, regular rhythm and intact distal pulses.  Exam reveals no gallop and no friction rub.   No murmur heard. Pulmonary/Chest: No respiratory distress. He has no wheezes. He has no rales.  Abdominal: Soft. Bowel sounds are normal. He exhibits no distension and no mass. There is no tenderness.  Musculoskeletal: Normal range of motion. He exhibits no edema or tenderness.  Lymphadenopathy:    He has no cervical adenopathy.  Neurological: He is alert and oriented to person, place, and time.  Skin: Skin is warm and dry. No rash noted. No erythema. No pallor.  Psychiatric: He has a normal mood and affect.   Results for orders placed or performed in visit on 04/20/15 (from the past 24 hour(s))  POCT glycosylated hemoglobin (Hb A1C)     Status: None   Collection Time: 04/20/15  5:29 PM  Result Value Ref Range   Hemoglobin A1C 7.6    Assessment and Plan :   1. Diabetes type 2, uncontrolled - Significant improvement, continue current regimen as well as healthy diet and exercise. - F/u in 3 months. - POCT glycosylated hemoglobin (Hb A1C)  2. Erectile dysfunction, unspecified erectile dysfunction type - Risks of sildenafil  use discussed, refill provided.  3. Essential hypertension - Stable, will f/u in 3 months, continue medications for now as well as healthy diet and exercise.  4. Chronic hepatitis C without hepatic coma - Continue f/u with Dr. Renaldo Reel, continue Harvoni.  5. Arthritis of shoulder region, right - Discussed appropriate management  for his shoulder, will start topical treatment. Refilled Tramadol for patient but agreed to decrease use of this medication as he may do well with topical therapy alone.  Todd Bamberg, PA-C [facility] [phone] 04/20/2015 5:02 PM

## 2015-05-15 ENCOUNTER — Other Ambulatory Visit: Payer: Self-pay | Admitting: Urgent Care

## 2015-05-25 ENCOUNTER — Other Ambulatory Visit: Payer: Self-pay | Admitting: Urgent Care

## 2015-05-25 ENCOUNTER — Encounter: Payer: Self-pay | Admitting: Urgent Care

## 2015-05-25 ENCOUNTER — Ambulatory Visit (INDEPENDENT_AMBULATORY_CARE_PROVIDER_SITE_OTHER): Payer: 59 | Admitting: Urgent Care

## 2015-05-25 ENCOUNTER — Ambulatory Visit (INDEPENDENT_AMBULATORY_CARE_PROVIDER_SITE_OTHER): Payer: 59

## 2015-05-25 ENCOUNTER — Telehealth: Payer: Self-pay | Admitting: Cardiology

## 2015-05-25 VITALS — BP 130/90 | HR 93 | Temp 99.1°F | Resp 16 | Wt 224.0 lb

## 2015-05-25 DIAGNOSIS — R9431 Abnormal electrocardiogram [ECG] [EKG]: Secondary | ICD-10-CM | POA: Diagnosis not present

## 2015-05-25 DIAGNOSIS — I10 Essential (primary) hypertension: Secondary | ICD-10-CM

## 2015-05-25 DIAGNOSIS — R059 Cough, unspecified: Secondary | ICD-10-CM

## 2015-05-25 DIAGNOSIS — R05 Cough: Secondary | ICD-10-CM

## 2015-05-25 LAB — POCT CBC
Granulocyte percent: 49.5 %G (ref 37–80)
HCT, POC: 46.6 % (ref 43.5–53.7)
Hemoglobin: 14.8 g/dL (ref 14.1–18.1)
Lymph, poc: 3.4 (ref 0.6–3.4)
MCH, POC: 27.7 pg (ref 27–31.2)
MCHC: 31.8 g/dL (ref 31.8–35.4)
MCV: 87.1 fL (ref 80–97)
MID (cbc): 0.5 (ref 0–0.9)
MPV: 9.4 fL (ref 0–99.8)
POC Granulocyte: 3.8 (ref 2–6.9)
POC LYMPH PERCENT: 44.1 %L (ref 10–50)
POC MID %: 6.4 %M (ref 0–12)
Platelet Count, POC: 196 10*3/uL (ref 142–424)
RBC: 5.35 M/uL (ref 4.69–6.13)
RDW, POC: 13.8 %
WBC: 7.7 10*3/uL (ref 4.6–10.2)

## 2015-05-25 MED ORDER — DILTIAZEM HCL ER 60 MG PO CP12: 60.0000 mg | ORAL_CAPSULE | Freq: Two times a day (BID) | ORAL | Status: DC

## 2015-05-25 MED ORDER — HYDROCHLOROTHIAZIDE 25 MG PO TABS: 25.0000 mg | ORAL_TABLET | Freq: Every day | ORAL | Status: DC

## 2015-05-25 NOTE — Progress Notes (Addendum)
MRN: [id number] DOB: 12/24/1955  Subjective:   Todd Greer is a 59 y.o. male presenting for chief complaint of Cough  Reports ~1 month history of dry hacking cough worse with deep breathing and at night, elicits shortness of breath. Has tried otc cough medication without any relief. Denies fever, sore throat, dizziness, chest pain, wheezing, heart racing, chest tightness, n/v, abdominal pain, lower leg swelling, calf pain. Of note, patient was previously taken off lisinopril and he believes amlodipine as well due to a chronic cough. After switching to losartan he did not have this issue but is now presenting with a cough again. Patient has been worked up by Dr. Herbie Baltimore, cardiologist, for heart disease approximately 2 years ago. Patient states that his workup was negative including EKG and stress test. He has not been back for follow-up and was clear to be managed by primary care. Denies any other aggravating or relieving factors, no other questions or concerns.  Lamin has a current medication list which includes the following prescription(s): aspirin, blood glucose monitoring suppl, carvedilol, desloratadine, diclofenac sodium, glipizide, ledipasvir-sofosbuvir, losartan-hydrochlorothiazide, metformin, multivitamin, sildenafil, and tramadol. Also is allergic to ace inhibitors and amlodipine.  Berel  has a past medical history of Diabetes mellitus type II, controlled, with no complications; Hypertension; Substance abuse; Anxiety; Obesity, Class II, BMI 35-39.9, with comorbidity; Obesity, diabetes, and hypertension syndrome; and Hepatitis C, chronic. Also  has past surgical history that includes NM  EXERCISE MYOVIEW LTD (06-2013).  Objective:   Vitals: BP 130/90 mmHg  Pulse 93  Temp(Src) 99.1 F (37.3 C) (Oral)  Resp 16  Wt 224 lb (101.606 kg)  Physical Exam  Constitutional: He is oriented to person, place, and time. He appears well-developed and well-nourished.  HENT:  Mouth/Throat:  Oropharynx is clear and moist.  Eyes: Pupils are equal, round, and reactive to light. No scleral icterus.  Cardiovascular: Normal rate and intact distal pulses.  Exam reveals no gallop and no friction rub.   No murmur heard. Irregularly irregular rhythm, not new.  Pulmonary/Chest: No respiratory distress. He has no wheezes. He has no rales.  Abdominal: Soft. Bowel sounds are normal. He exhibits no distension and no mass. There is no tenderness.  Musculoskeletal: He exhibits no edema.  Neurological: He is alert and oriented to person, place, and time.  Skin: Skin is warm and dry. No rash noted. No erythema. No pallor.   Results for orders placed or performed in visit on 05/25/15 (from the past 24 hour(s))  POCT CBC     Status: None   Collection Time: 05/25/15  4:25 PM  Result Value Ref Range   WBC 7.7 4.6 - 10.2 K/uL   Lymph, poc 3.4 0.6 - 3.4   POC LYMPH PERCENT 44.1 10 - 50 %L   MID (cbc) 0.5 0 - 0.9   POC MID % 6.4 0 - 12 %M   POC Granulocyte 3.8 2 - 6.9   Granulocyte percent 49.5 37 - 80 %G   RBC 5.35 4.69 - 6.13 M/uL   Hemoglobin 14.8 14.1 - 18.1 g/dL   HCT, POC 81.1 91.4 - 53.7 %   MCV 87.1 80 - 97 fL   MCH, POC 27.7 27 - 31.2 pg   MCHC 31.8 31.8 - 35.4 g/dL   RDW, POC 78.2 %   Platelet Count, POC 196 142 - 424 K/uL   MPV 9.4 0 - 99.8 fL   ECG interpretation by Dr. 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  He has more frequent ectopic beats than in the past EKG.  Sometimes in [place]   CHEST 2 VIEW  COMPARISON: 12/24/2013 chest radiograph.  FINDINGS: Stable cardiomediastinal silhouette with normal heart size. No pneumothorax. No pleural effusion. Clear lungs, with no pulmonary edema and no focal lung consolidation to suggest a pneumonia. Visualized osseous structures appear intact.  IMPRESSION: No active disease in the chest.  [facility] reading (PRIMARY) by  Dr. Patsy Lager and PA-Mani. Chest: normal.  Assessment and Plan :   This  case was precepted with Dr. Patsy Lager.  1. Cough 2. Essential hypertension 3. Abnormal resting ECG findings Pt is concerned that his losartan may be causing his cough - Our practice directly contacted Dr. Herbie Baltimore for his opinion regarding our mutual patient. We agreed to d/c los-HCT due to cough as an adverse effect from losartan. Per Dr. Elissa Hefty recommendation we will start patient on diltiazem 60mg  BID and refer the patient to Dr. Elissa Hefty office. Patient is to continue HCT and carvedilol. Follow up with me in 4 weeks.  Wallis Bamberg, PA-C [facility] [phone] 05/25/2015 1:00 PM

## 2015-05-25 NOTE — Patient Instructions (Signed)
Diltiazem extended-release capsules or tablets  What is this medicine?  DILTIAZEM (dil TYE a zem) is a calcium-channel blocker. It affects the amount of calcium found in your heart and muscle cells. This relaxes your blood vessels, which can reduce the amount of work the heart has to do. This medicine is used to treat high blood pressure and chest pain caused by angina.  This medicine may be used for other purposes; ask your health care provider or pharmacist if you have questions.  COMMON BRAND NAME(S): Cardizem CD, Cardizem LA, Cardizem SR, Cartia XT, Dilacor XR, Dilt-CD, Diltia XT, Diltzac, Matzim LA, Taztia XT, Tiamate, Tiazac  What should I tell my health care provider before I take this medicine?  They need to know if you have any of these conditions:  -heart problems, low blood pressure, irregular heartbeat  -liver disease  -previous heart attack  -an unusual or allergic reaction to diltiazem, other medicines, foods, dyes, or preservatives  -pregnant or trying to get pregnant  -breast-feeding  How should I use this medicine?  Take this medicine by mouth with a glass of water. Follow the directions on the prescription label. Swallow whole, do not crush or chew. Ask your doctor or pharmacist if your should take this medicine with food. Take your doses at regular intervals. Do not take your medicine more often then directed. Do not stop taking except on the advice of your doctor or health care professional. Ask your doctor or health care professional how to gradually reduce the dose.  Talk to your pediatrician regarding the use of this medicine in children. Special care may be needed.  Overdosage: If you think you have taken too much of this medicine contact a poison control center or emergency room at once.  NOTE: This medicine is only for you. Do not share this medicine with others.  What if I miss a dose?  If you miss a dose, take it as soon as you can. If it is almost time for your next dose, take only that  dose. Do not take double or extra doses.  What may interact with this medicine?  Do not take this medicine with any of the following medications:  -cisapride  -hawthorn  -pimozide  -ranolazine  -red yeast rice  This medicine may also interact with the following medications:  -buspirone  -carbamazepine  -cimetidine  -cyclosporine  -digoxin  -local anesthetics or general anesthetics  -lovastatin  -medicines for anxiety or difficulty sleeping like midazolam and triazolam  -medicines for high blood pressure or heart problems  -quinidine  -rifampin, rifabutin, or rifapentine  This list may not describe all possible interactions. Give your health care provider a list of all the medicines, herbs, non-prescription drugs, or dietary supplements you use. Also tell them if you smoke, drink alcohol, or use illegal drugs. Some items may interact with your medicine.  What should I watch for while using this medicine?  Check your blood pressure and pulse rate regularly. Ask your doctor or health care professional what your blood pressure and pulse rate should be and when you should contact him or her.  You may feel dizzy or lightheaded. Do not drive, use machinery, or do anything that needs mental alertness until you know how this medicine affects you. To reduce the risk of dizzy or fainting spells, do not sit or stand up quickly, especially if you are an older patient. Alcohol can make you more dizzy or increase flushing and rapid heartbeats. Avoid alcoholic drinks.    What side effects may I notice from receiving this medicine?  Side effects that you should report to your doctor or health care professional as soon as possible:  -allergic reactions like skin rash, itching or hives, swelling of the face, lips, or tongue  -confusion, mental depression  -feeling faint or lightheaded, falls  -redness, blistering, peeling or loosening of the skin, including inside the mouth  -slow, irregular heartbeat  -swelling of the feet and  ankles  -unusual bleeding or bruising, pinpoint red spots on the skin  Side effects that usually do not require medical attention (report to your doctor or health care professional if they continue or are bothersome):  -constipation or diarrhea  -difficulty sleeping  -facial flushing  -headache  -nausea, vomiting  -sexual dysfunction  -weak or tired  This list may not describe all possible side effects. Call your doctor for medical advice about side effects. You may report side effects to FDA at 1-800-FDA-1088.  Where should I keep my medicine?  Keep out of the reach of children.  Store at room temperature between 15 and 30 degrees C (59 and 86 degrees F). Protect from humidity. Throw away any unused medicine after the expiration date.  NOTE: This sheet is a summary. It may not cover all possible information. If you have questions about this medicine, talk to your doctor, pharmacist, or health care provider.   2015, Elsevier/Gold Standard. (2007-12-10 14:35:47)

## 2015-05-26 ENCOUNTER — Telehealth: Payer: Self-pay

## 2015-05-26 DIAGNOSIS — R059 Cough, unspecified: Secondary | ICD-10-CM

## 2015-05-26 DIAGNOSIS — R05 Cough: Secondary | ICD-10-CM

## 2015-05-26 MED ORDER — BENZONATATE 100 MG PO CAPS: 100.0000 mg | ORAL_CAPSULE | Freq: Three times a day (TID) | ORAL | Status: DC | PRN

## 2015-05-26 MED ORDER — HYDROCOD POLST-CPM POLST ER 10-8 MG/5ML PO SUER: 5.0000 mL | Freq: Two times a day (BID) | ORAL | Status: DC | PRN

## 2015-05-26 NOTE — Telephone Encounter (Signed)
Pt needing something for his cough -- worse at night, but persistent even during the day. He saw [facility]in yesterday and thought cough to be d/t losartan-HCTZ. He was taken off and started on diltiazem. I have prescribed tussionex and tessalon. I advised him tessalon may not help him very much but he can try. He is agreeable to the plan. He has been informed meds are ready for pickup.

## 2015-05-26 NOTE — Telephone Encounter (Signed)
Dr Herbie Baltimore spoke urgent care - patient sent to ER

## 2015-05-26 NOTE — Telephone Encounter (Signed)
Pt was seen here yesterday and saw [facility] for a cough. He would like a cough medication. Please advise at [phone]

## 2015-06-10 ENCOUNTER — Other Ambulatory Visit: Payer: Self-pay | Admitting: Physician Assistant

## 2015-06-10 ENCOUNTER — Telehealth: Payer: Self-pay | Admitting: *Deleted

## 2015-06-10 ENCOUNTER — Other Ambulatory Visit: Payer: Self-pay | Admitting: Urgent Care

## 2015-06-10 NOTE — Telephone Encounter (Signed)
Spoke with patient he has had no relief since his visit 9/23.  Going to come back in for a recheck

## 2015-06-11 ENCOUNTER — Other Ambulatory Visit: Payer: Self-pay | Admitting: Urgent Care

## 2015-06-12 ENCOUNTER — Telehealth: Payer: Self-pay | Admitting: Family Medicine

## 2015-06-12 NOTE — Telephone Encounter (Signed)
Spoke with patient about his new appointment time on 08/03/15 at 4:15 i also spoke with his wife and advised her also

## 2015-06-12 NOTE — Telephone Encounter (Signed)
I'll give this patient one more refill for his Tramadol but as I discussed with him previously, this is not meant to be a long term medicine, please see my note from 04/20/2015. If his joint pain, persists, he needs to go back to ortho. No more refills for this from me after this.

## 2015-06-12 NOTE — Telephone Encounter (Signed)
Faxed Rx and notified pt of Mike's message on VM.

## 2015-06-29 ENCOUNTER — Ambulatory Visit (INDEPENDENT_AMBULATORY_CARE_PROVIDER_SITE_OTHER): Payer: 59 | Admitting: Urgent Care

## 2015-06-29 ENCOUNTER — Encounter: Payer: Self-pay | Admitting: Urgent Care

## 2015-06-29 VITALS — BP 144/92 | HR 89 | Temp 98.8°F | Resp 16 | Ht 66.5 in | Wt 222.2 lb

## 2015-06-29 DIAGNOSIS — Z23 Encounter for immunization: Secondary | ICD-10-CM

## 2015-06-29 DIAGNOSIS — R9431 Abnormal electrocardiogram [ECG] [EKG]: Secondary | ICD-10-CM

## 2015-06-29 DIAGNOSIS — Z8619 Personal history of other infectious and parasitic diseases: Secondary | ICD-10-CM | POA: Diagnosis not present

## 2015-06-29 DIAGNOSIS — R05 Cough: Secondary | ICD-10-CM | POA: Diagnosis not present

## 2015-06-29 DIAGNOSIS — E119 Type 2 diabetes mellitus without complications: Secondary | ICD-10-CM | POA: Diagnosis not present

## 2015-06-29 DIAGNOSIS — R059 Cough, unspecified: Secondary | ICD-10-CM

## 2015-06-29 DIAGNOSIS — I10 Essential (primary) hypertension: Secondary | ICD-10-CM | POA: Diagnosis not present

## 2015-06-29 MED ORDER — FAMOTIDINE 20 MG PO TABS: 20.0000 mg | ORAL_TABLET | Freq: Two times a day (BID) | ORAL | Status: DC

## 2015-06-29 NOTE — Patient Instructions (Signed)
-   Increase dose of diltiazem to 2 pills in the morning (120mg ) and 1 pill at night (60mg ). Continue checking your blood pressure daily. Please let me know if your readings are consistently less than 110 or greater than 150 systolic (top number).   Cough, Adult Coughing is a reflex that clears your throat and your airways. Coughing helps to heal and protect your lungs. It is normal to cough occasionally, but a cough that happens with other symptoms or lasts a long time may be a sign of a condition that needs treatment. A cough may last only 2-3 weeks (acute), or it may last longer than 8 weeks (chronic). CAUSES Coughing is commonly caused by:  Breathing in substances that irritate your lungs.  A viral or bacterial respiratory infection.  Allergies.  Asthma.  Postnasal drip.  Smoking.  Acid backing up from the stomach into the esophagus (gastroesophageal reflux).  Certain medicines.  Chronic lung problems, including COPD (or rarely, lung cancer).  Other medical conditions such as heart failure. HOME CARE INSTRUCTIONS  Pay attention to any changes in your symptoms. Take these actions to help with your discomfort:  Take medicines only as told by your health care provider.  If you were prescribed an antibiotic medicine, take it as told by your health care provider. Do not stop taking the antibiotic even if you start to feel better.  Talk with your health care provider before you take a cough suppressant medicine.  Drink enough fluid to keep your urine clear or pale yellow.  If the air is dry, use a cold steam vaporizer or humidifier in your bedroom or your home to help loosen secretions.  Avoid anything that causes you to cough at work or at home.  If your cough is worse at night, try sleeping in a semi-upright position.  Avoid cigarette smoke. If you smoke, quit smoking. If you need help quitting, ask your health care provider.  Avoid caffeine.  Avoid alcohol.  Rest as  needed. SEEK MEDICAL CARE IF:   You have new symptoms.  You cough up pus.  Your cough does not get better after 2-3 weeks, or your cough gets worse.  You cannot control your cough with suppressant medicines and you are losing sleep.  You develop pain that is getting worse or pain that is not controlled with pain medicines.  You have a fever.  You have unexplained weight loss.  You have night sweats. SEEK IMMEDIATE MEDICAL CARE IF:  You cough up blood.  You have difficulty breathing.  Your heartbeat is very fast.   This information is not intended to replace advice given to you by your health care provider. Make sure you discuss any questions you have with your health care provider.   Document Released: 02/15/2011 Document Revised: 05/10/2015 Document Reviewed: 10/26/2014 Elsevier Interactive Patient Education [employer]

## 2015-06-29 NOTE — Progress Notes (Signed)
    MRN: [id number] DOB: 06-05-1956  Subjective:   Todd Greer is a 59 y.o. male presenting for chief complaint of Follow-up  HTN and cough - patient was was seen 05/25/2015, stopped lisinopril due to persistent cough and added diltiazem 60 mg ER as per Dr. Elissa Hefty instructions. He has his cardiologist appointment in December. Patient checks his blood pressure daily, runs between 120-140 systolic. He reports that his cough is mostly unchanged, dry and occurs mostly at night when he lies down. Denies history of GERD but admits that he drinks 2 alcoholic beverages a night. Denies lightheadedness, dizziness, chronic headache, double vision, chest pain, shortness of breath, heart racing, palpitations, nausea, vomiting, abdominal pain, hematuria, lower leg swelling. Denies any other aggravating or relieving factors, no other questions or concerns.  Todd Greer has a current medication list which includes the following prescription(s): aspirin, benzonatate, blood glucose monitoring suppl, carvedilol, chlorpheniramine-hydrocodone, desloratadine, diclofenac sodium, diltiazem, glipizide, hydrochlorothiazide, ledipasvir-sofosbuvir, metformin, multivitamin, sildenafil, and tramadol. Also is allergic to ace inhibitors and amlodipine.  Todd Greer  has a past medical history of Diabetes mellitus type II, controlled, with no complications; Hypertension; Substance abuse; Anxiety; Obesity, Class II, BMI 35-39.9, with comorbidity; Obesity, diabetes, and hypertension syndrome; and Hepatitis C, chronic. Also  has past surgical history that includes NM  EXERCISE MYOVIEW LTD (06-2013).  Objective:   Vitals: BP 144/92 mmHg  Pulse 89  Temp(Src) 98.8 F (37.1 C) (Oral)  Resp 16  Ht 5' 6.5" (1.689 m)  Wt 222 lb 3.2 oz (100.789 kg)  BMI 35.33 kg/m2  Physical Exam  Constitutional: He is oriented to person, place, and time. He appears well-developed and well-nourished.  HENT:  Mouth/Throat: Oropharynx is clear and moist.    Eyes: Pupils are equal, round, and reactive to light. No scleral icterus.  Cardiovascular: Normal rate, regular rhythm and intact distal pulses.  Exam reveals no gallop and no friction rub.   No murmur heard. Pulmonary/Chest: No respiratory distress. He has no wheezes. He has no rales.  Musculoskeletal: He exhibits no edema.  Neurological: He is alert and oriented to person, place, and time.  Skin: Skin is warm and dry. No rash noted. No erythema. No pallor.  Psychiatric: He has a normal mood and affect.   Assessment and Plan :   1. Essential hypertension 2. Abnormal resting ECG findings - Stable, I will answer to a dose of 60 mg of diltiazem ER in the morning. He will thus be taking 120mg  am, 60mg  pm. Patient is to continue checking his blood pressure daily, notify me if he is persistently below 110 or 150 systolic. Counseled patient on worsening signs and symptoms of uncontrolled hypertension including dizziness, chest pain, hematuria, headache. Patient will let me know if these things happen.  3. Cough - Recommended patient stop drinking alcohol - he states he will try to cut down to 1 drink per night. Start trial of famotidine to address GERD as the cause. Patient is in agreement.  4. Diabetes mellitus without complication (HCC) - stable, followup in one month.  5. History of hepatitis C - Completed Harvoni treatment, he has followup scheduled with GI next week.  6. Need for prophylactic vaccination and inoculation against influenza - Flu Vaccine QUAD 36+ mos IM   Wallis Bamberg, PA-C [facility] [phone] 06/29/2015 4:21 PM

## 2015-06-30 NOTE — Addendum Note (Signed)
Addended by: Thelma Barge D on: 06/30/2015 05:19 PM   Modules accepted: Orders

## 2015-07-24 ENCOUNTER — Ambulatory Visit: Payer: Self-pay | Admitting: Urgent Care

## 2015-07-25 ENCOUNTER — Other Ambulatory Visit: Payer: Self-pay | Admitting: Family Medicine

## 2015-07-26 NOTE — Telephone Encounter (Signed)
Faxed

## 2015-08-03 ENCOUNTER — Ambulatory Visit: Payer: 59 | Admitting: Urgent Care

## 2015-08-07 ENCOUNTER — Other Ambulatory Visit: Payer: Self-pay | Admitting: Urgent Care

## 2015-08-08 ENCOUNTER — Ambulatory Visit: Payer: 59 | Admitting: Cardiology

## 2015-08-10 ENCOUNTER — Ambulatory Visit: Payer: 59 | Admitting: Urgent Care

## 2015-09-11 ENCOUNTER — Ambulatory Visit: Payer: 59 | Admitting: Cardiology

## 2015-09-21 ENCOUNTER — Ambulatory Visit: Payer: 59 | Admitting: Urgent Care

## 2015-10-26 ENCOUNTER — Ambulatory Visit: Payer: Self-pay | Admitting: Cardiology

## 2015-10-27 ENCOUNTER — Encounter: Payer: Self-pay | Admitting: *Deleted

## 2017-08-27 ENCOUNTER — Other Ambulatory Visit: Payer: Self-pay

## 2017-08-27 ENCOUNTER — Emergency Department (HOSPITAL_COMMUNITY): Payer: Self-pay

## 2017-08-27 ENCOUNTER — Emergency Department (HOSPITAL_COMMUNITY)
Admission: EM | Admit: 2017-08-27 | Discharge: 2017-08-27 | Disposition: A | Discharge status: Home / Self Care | Payer: Self-pay | Attending: Emergency Medicine | Admitting: Emergency Medicine

## 2017-08-27 ENCOUNTER — Encounter (HOSPITAL_COMMUNITY): Payer: Self-pay | Admitting: Nurse Practitioner

## 2017-08-27 DIAGNOSIS — Z7984 Long term (current) use of oral hypoglycemic drugs: Secondary | ICD-10-CM | POA: Insufficient documentation

## 2017-08-27 DIAGNOSIS — Z7982 Long term (current) use of aspirin: Secondary | ICD-10-CM | POA: Insufficient documentation

## 2017-08-27 DIAGNOSIS — E119 Type 2 diabetes mellitus without complications: Secondary | ICD-10-CM | POA: Insufficient documentation

## 2017-08-27 DIAGNOSIS — I10 Essential (primary) hypertension: Secondary | ICD-10-CM | POA: Insufficient documentation

## 2017-08-27 DIAGNOSIS — I509 Heart failure, unspecified: Secondary | ICD-10-CM | POA: Insufficient documentation

## 2017-08-27 DIAGNOSIS — I110 Hypertensive heart disease with heart failure: Secondary | ICD-10-CM | POA: Insufficient documentation

## 2017-08-27 DIAGNOSIS — Z87891 Personal history of nicotine dependence: Secondary | ICD-10-CM | POA: Insufficient documentation

## 2017-08-27 DIAGNOSIS — I493 Ventricular premature depolarization: Secondary | ICD-10-CM | POA: Insufficient documentation

## 2017-08-27 DIAGNOSIS — Z79899 Other long term (current) drug therapy: Secondary | ICD-10-CM | POA: Insufficient documentation

## 2017-08-27 DIAGNOSIS — R0602 Shortness of breath: Secondary | ICD-10-CM | POA: Insufficient documentation

## 2017-08-27 LAB — COMPREHENSIVE METABOLIC PANEL
ALT: 44 U/L (ref 17–63)
AST: 47 U/L — ABNORMAL HIGH (ref 15–41)
Albumin: 3.6 g/dL (ref 3.5–5.0)
Alkaline Phosphatase: 54 U/L (ref 38–126)
Anion gap: 9 (ref 5–15)
BUN: 13 mg/dL (ref 6–20)
CO2: 22 mmol/L (ref 22–32)
Calcium: 9.2 mg/dL (ref 8.9–10.3)
Chloride: 105 mmol/L (ref 101–111)
Creatinine, Ser: 1.05 mg/dL (ref 0.61–1.24)
GFR calc Af Amer: >60 mL/min (ref >60)
GFR calc non Af Amer: >60 mL/min (ref >60)
Glucose, Bld: 137 mg/dL — ABNORMAL HIGH (ref 65–99)
Potassium: 4.1 mmol/L (ref 3.5–5.1)
Sodium: 136 mmol/L (ref 135–145)
Total Bilirubin: 0.9 mg/dL (ref 0.3–1.2)
Total Protein: 7.7 g/dL (ref 6.5–8.1)

## 2017-08-27 LAB — CBC WITH DIFFERENTIAL/PLATELET
Basophils Absolute: 0.1 10*3/uL (ref 0.0–0.1)
Basophils Relative: 1 %
Eosinophils Absolute: 0.1 10*3/uL (ref 0.0–0.7)
Eosinophils Relative: 2 %
HCT: 42.7 % (ref 39.0–52.0)
Hemoglobin: 14.7 g/dL (ref 13.0–17.0)
Lymphocytes Relative: 35 %
Lymphs Abs: 2.7 10*3/uL (ref 0.7–4.0)
MCH: 28.4 pg (ref 26.0–34.0)
MCHC: 34.4 g/dL (ref 30.0–36.0)
MCV: 82.6 fL (ref 78.0–100.0)
Monocytes Absolute: 0.5 10*3/uL (ref 0.1–1.0)
Monocytes Relative: 7 %
Neutro Abs: 4.1 10*3/uL (ref 1.7–7.7)
Neutrophils Relative %: 55 %
Platelets: 258 10*3/uL (ref 150–400)
RBC: 5.17 MIL/uL (ref 4.22–5.81)
RDW: 13.7 % (ref 11.5–15.5)
WBC: 7.5 10*3/uL (ref 4.0–10.5)

## 2017-08-27 LAB — I-STAT TROPONIN, ED: Troponin i, poc: 0.04 ng/mL (ref 0.00–0.08)

## 2017-08-27 LAB — D-DIMER, QUANTITATIVE: D-Dimer, Quant: 0.42 ug/mL-FEU (ref 0.00–0.50)

## 2017-08-27 LAB — BRAIN NATRIURETIC PEPTIDE: B Natriuretic Peptide: 941.8 pg/mL — ABNORMAL HIGH (ref 0.0–100.0)

## 2017-08-27 MED ORDER — ALBUTEROL SULFATE (2.5 MG/3ML) 0.083% IN NEBU
5.0000 mg | INHALATION_SOLUTION | Freq: Once | RESPIRATORY_TRACT | Status: AC
  Administered 2017-08-27: 5 mg via RESPIRATORY_TRACT
  Filled 2017-08-27: qty 6

## 2017-08-27 MED ORDER — FUROSEMIDE 20 MG PO TABS: 20.0000 mg | ORAL_TABLET | Freq: Every day | ORAL | 0 refills | Status: DC

## 2017-08-27 MED ORDER — FUROSEMIDE 10 MG/ML IJ SOLN
40.0000 mg | Freq: Once | INTRAMUSCULAR | Status: AC
  Administered 2017-08-27: 40 mg via INTRAVENOUS
  Filled 2017-08-27: qty 4

## 2017-08-27 NOTE — ED Provider Notes (Signed)
[facility]-EMERGENCY DEPT Provider Note   CSN: [id number] Arrival date & time: 08/27/17  7672     History   Chief Complaint Chief Complaint  Patient presents with  . Cough  . Shortness of Breath    HPI Todd Greer is a 61 y.o. male.  HPI   Presents with cough, dyspnea Reports dyspnea over the last month, worse with exertion and laying flat Wakes up in the middle of the night short of breath No chest pain, but does report some pressure around the neck, notices it after coughing or when laying down.   Cough for about a month, dry hacking cough Taking mucinex but cough still there  No smoking, no other drugs, nor etoh Brother had a defibrillator, severe CHF,  father had a CVA Hx of htn, DM hasn't missed medications. [facility].  [facility] and was started on symbicort, which is not helping Used to smoke, smoked for 10 years 35 years ago   Past Medical History:  Diagnosis Date  . Anxiety   . Diabetes mellitus type II, controlled, with no complications (HCC)   . Hepatitis C, chronic (HCC)   . Hypertension   . Obesity, Class II, BMI 35-39.9, with comorbidity   . Obesity, diabetes, and hypertension syndrome (HCC)   . Substance abuse [facility])     Patient Active Problem List   Diagnosis Date Noted  . Hypertrophy of prostate without urinary obstruction and other lower urinary tract symptoms (LUTS) 12/03/2013  . Erectile dysfunction 08/20/2013  . Personal history of colonic polyps 07/20/2013  . Exertional dyspnea 06/01/2013    Class: Acute  . Obesity (BMI 30-39.9) 06/01/2013  . Abnormal resting ECG findings - bifascicular block with PVCs 06/01/2013  . DM II (diabetes mellitus, type II), controlled (HCC) 05/28/2013  . DYSLIPIDEMIA 02/11/2007  . ANXIETY DISORDER 02/11/2007  . ALCOHOL USE 02/11/2007  . Essential hypertension 02/11/2007    Past Surgical History:  Procedure Laterality Date  . NM  EXERCISE  MYOVIEW LTD  06-2013   No ischemia or infarction; note dated images       Home Medications    Prior to Admission medications   Medication Sig Start Date End Date Taking? Authorizing Provider  aspirin 81 MG tablet Take 81 mg by mouth daily.   Yes [provider]  Blood Glucose Monitoring Suppl SUPPLIES MISC Use as directed 08/20/13  Yes Maurice March, MD  carvedilol (COREG) 3.125 MG tablet TAKE 1 TABLET BY MOUTH TWICE DAILY WITH A MEAL Patient taking differently: TAKE 1 TABLET BY MOUTH DAILY WITH A MEAL 05/15/15  Yes Wallis Bamberg, PA-C  Cholecalciferol 1000 units tablet Take 2,000 Units by mouth daily.   Yes [provider]  diltiazem (CARDIZEM SR) 60 MG 12 hr capsule TAKE 1 CAPSULE(60 MG) BY MOUTH TWICE DAILY 05/26/15  Yes Wallis Bamberg, PA-C  glipiZIDE (GLUCOTROL XL) 10 MG 24 hr tablet TAKE 2 TABLETS BY MOUTH DAILY 08/08/15  Yes Wallis Bamberg, PA-C  metFORMIN (GLUCOPHAGE) 1000 MG tablet Take 1 tablet (1,000 mg total) by mouth 2 (two) times daily with a meal. 04/20/15  Yes Wallis Bamberg, PA-C  desloratadine (CLARINEX) 5 MG tablet Take 1 tablet (5 mg total) by mouth daily. Patient not taking: Reported on 08/27/2017 04/20/15   Wallis Bamberg, PA-C  Diclofenac Sodium 1 % CREA Place 1 application onto the skin daily as needed. Patient not taking: Reported on 08/27/2017 04/20/15   Wallis Bamberg, PA-C  famotidine (  PEPCID) 20 MG tablet Take 1 tablet (20 mg total) by mouth 2 (two) times daily. Patient not taking: Reported on 08/27/2017 06/29/15   Wallis Bamberg, PA-C  furosemide (LASIX) 20 MG tablet Take 1 tablet (20 mg total) by mouth daily. Take 40mg  daily for 3 days, followed by 20mg  daily or as directed by your physician 08/27/17   Alvira Monday, MD  hydrochlorothiazide (HYDRODIURIL) 25 MG tablet Take 1 tablet (25 mg total) by mouth daily. Patient not taking: Reported on 08/27/2017 05/25/15   Wallis Bamberg, PA-C  sildenafil (REVATIO) 20 MG tablet Take 1 tablet (20 mg total) by mouth as  needed. Patient not taking: Reported on 08/27/2017 04/20/15   Wallis Bamberg, PA-C  traMADol (ULTRAM) 50 MG tablet TAKE 1 TO 2 TABLETS BY MOUTH EVERY NIGHT AT BEDTIME AS NEEDED Patient not taking: Reported on 08/27/2017 07/25/15   Wallis Bamberg, PA-C    Family History Family History  Problem Relation Age of Onset  . Stroke Father   . Diabetes Father   . Hypertension Father   . Cancer Sister   . Cerebral aneurysm Mother        Died at a young age.  . Diabetes Paternal Grandmother   . Hypertension Paternal Grandmother   . Stroke Paternal Grandmother     Social History Social History   Tobacco Use  . Smoking status: Former Smoker    Packs/day: 0.50    Years: 10.00    Pack years: 5.00    Types: Cigarettes    Last attempt to quit: 10/04/1983    Years since quitting: 33.9  . Smokeless tobacco: Never Used  Substance Use Topics  . Alcohol use: Yes    Alcohol/week: 3.0 oz    Types: 6 Standard drinks or equivalent per week  . Drug use: No     Allergies   Ace inhibitors and Amlodipine   Review of Systems Review of Systems  Constitutional: Positive for fatigue. Negative for fever.  HENT: Negative for sore throat.   Eyes: Negative for visual disturbance.  Respiratory: Positive for cough, shortness of breath and wheezing.   Cardiovascular: Negative for chest pain and leg swelling.  Gastrointestinal: Negative for abdominal pain, nausea and vomiting.  Genitourinary: Negative for difficulty urinating.  Musculoskeletal: Negative for back pain and neck stiffness.  Skin: Negative for rash.  Neurological: Positive for light-headedness. Negative for syncope and headaches.     Physical Exam Updated Vital Signs BP 131/90 (BP Location: Right Arm)   Pulse (!) 104   Temp 98.3 F (36.8 C) (Oral)   Resp 20   Ht 5\' 7"  (1.702 m)   Wt 96.2 kg (212 lb)   SpO2 96%   BMI 33.20 kg/m   Physical Exam  Constitutional: He is oriented to person, place, and time. He appears well-developed and  well-nourished. No distress.  HENT:  Head: Normocephalic and atraumatic.  Eyes: Conjunctivae and EOM are normal.  Neck: Normal range of motion. JVD present.  Cardiovascular: Normal rate, regular rhythm, normal heart sounds and intact distal pulses. Exam reveals no gallop and no friction rub.  No murmur heard. Pulmonary/Chest: Effort normal and breath sounds normal. No respiratory distress. He has no wheezes. He has no rales.  Bronchitic cough  Abdominal: Soft. He exhibits no distension. There is no tenderness. There is no guarding.  Musculoskeletal: He exhibits no edema.  Neurological: He is alert and oriented to person, place, and time.  Skin: Skin is warm and dry. He is not diaphoretic.  Nursing  note and vitals reviewed.    ED Treatments / Results  Labs (all labs ordered are listed, but only abnormal results are displayed) Labs Reviewed  COMPREHENSIVE METABOLIC PANEL - Abnormal; Notable for the following components:      Result Value   Glucose, Bld 137 (*)    AST 47 (*)    All other components within normal limits  BRAIN NATRIURETIC PEPTIDE - Abnormal; Notable for the following components:   B Natriuretic Peptide 941.8 (*)    All other components within normal limits  CBC WITH DIFFERENTIAL/PLATELET  D-DIMER, QUANTITATIVE (NOT AT [facility])  I-STAT TROPONIN, ED    EKG  EKG Interpretation  Date/Time:  Wednesday August 27 2017 09:00:41 EST Ventricular Rate:  108 PR Interval:    QRS Duration: 102 QT Interval:  387 QTC Calculation: 463 R Axis:   -84 Text Interpretation:  Sinus tachycardia Probable left atrial enlargement Left anterior fascicular block Probable anterior infarct, old Minimal ST elevation, lateral leads Baseline wander in lead(s) V1 V2 No previous ECGs available Confirmed by Alvira MondaySchlossman, Mayli Covington ([id number]) on 08/27/2017 9:08:25 AM       Radiology Dg Chest 2 View  Result Date: 08/27/2017 CLINICAL DATA:  Shortness of breath, cough EXAM: CHEST  2 VIEW COMPARISON:   05/25/2015 FINDINGS: There is no focal parenchymal opacity. There is no pleural effusion or pneumothorax. There is mild cardiomegaly. The osseous structures are unremarkable. IMPRESSION: No active cardiopulmonary disease. Electronically Signed   By: Elige KoHetal  Patel   On: 08/27/2017 11:17    Procedures Procedures (including critical care time)  Medications Ordered in ED Medications  albuterol (PROVENTIL) (2.5 MG/3ML) 0.083% nebulizer solution 5 mg (5 mg Nebulization Given 08/27/17 0939)  furosemide (LASIX) injection 40 mg (40 mg Intravenous Given 08/27/17 1242)     Initial Impression / Assessment and Plan / ED Course  I have reviewed the triage vital signs and the nursing notes.  Pertinent labs & imaging results that were available during my care of the patient were reviewed by me and considered in my medical decision making (see chart for details).     61yo male with history of htn, DM, hep C, fam hx of brother with severe CHF, presents with concern for weeks of progressive shortness of breath, orthopnea, paroxysmal nocturnal dyspnea.  EKG shows multiple PVCs. Troponin negative. DDimer negative and pt low risk [place].   No anemia or electrolyte abnormalities. CXR without acute abnormalities. Pt with JVD, symptoms consistent with CHF, BNP elevated to 941.  He has normal oxygenation, normal respiratory rate. Initially discussed admission with Dr. Onalee Huaavid given symptomatic dyspnea, however he doesn't meet inpt criteria. Discussed with patient, agree to give lasix in ED and rx for lasix.  Pt has PCP for close follow up. Patient discharged in stable condition with understanding of reasons to return.   Final Clinical Impressions(s) / ED Diagnoses   Final diagnoses:  Acute congestive heart failure, unspecified heart failure type (HCC)  PVCs (premature ventricular contractions)    ED Discharge Orders        Ordered    furosemide (LASIX) 20 MG tablet  Daily     08/27/17 1333       Alvira MondaySchlossman,  Nelsie Domino, MD 08/27/17 2202

## 2017-08-27 NOTE — ED Triage Notes (Signed)
Patient presents from home with nonproductive cough and shortness of breath x3-4 weeks. Patient reports he doesn't have a PCP. Reports he saw a NP at urgent care who told him he had COPD. States he has been taking Mucinex OTC but denies any other treatments. Patient is A&O x4, ambulatory. Some shortness of breath noted with speaking and exertion.

## 2017-08-27 NOTE — ED Notes (Signed)
Pt ambulatory and independent at discharge.  Verbalized understanding of discharge instructions 

## 2017-09-02 DIAGNOSIS — I5042 Chronic combined systolic (congestive) and diastolic (congestive) heart failure: Secondary | ICD-10-CM

## 2017-09-02 DIAGNOSIS — I420 Dilated cardiomyopathy: Secondary | ICD-10-CM

## 2017-09-02 HISTORY — DX: Dilated cardiomyopathy: I42.0

## 2017-09-02 HISTORY — DX: Chronic combined systolic (congestive) and diastolic (congestive) heart failure: I50.42

## 2017-09-05 ENCOUNTER — Emergency Department (HOSPITAL_COMMUNITY): Payer: Self-pay

## 2017-09-05 ENCOUNTER — Inpatient Hospital Stay (HOSPITAL_COMMUNITY)
Admission: EM | Admit: 2017-09-05 | Discharge: 2017-09-09 | DRG: 286 | Disposition: A | Discharge status: Home / Self Care | Payer: Self-pay | Attending: Family Medicine | Admitting: Family Medicine

## 2017-09-05 ENCOUNTER — Other Ambulatory Visit: Payer: Self-pay

## 2017-09-05 ENCOUNTER — Encounter (HOSPITAL_COMMUNITY): Payer: Self-pay

## 2017-09-05 DIAGNOSIS — Z888 Allergy status to other drugs, medicaments and biological substances status: Secondary | ICD-10-CM

## 2017-09-05 DIAGNOSIS — I5042 Chronic combined systolic (congestive) and diastolic (congestive) heart failure: Secondary | ICD-10-CM

## 2017-09-05 DIAGNOSIS — E119 Type 2 diabetes mellitus without complications: Secondary | ICD-10-CM | POA: Diagnosis present

## 2017-09-05 DIAGNOSIS — Z7951 Long term (current) use of inhaled steroids: Secondary | ICD-10-CM

## 2017-09-05 DIAGNOSIS — I5030 Unspecified diastolic (congestive) heart failure: Secondary | ICD-10-CM

## 2017-09-05 DIAGNOSIS — Z8619 Personal history of other infectious and parasitic diseases: Secondary | ICD-10-CM

## 2017-09-05 DIAGNOSIS — I5021 Acute systolic (congestive) heart failure: Secondary | ICD-10-CM | POA: Diagnosis present

## 2017-09-05 DIAGNOSIS — E785 Hyperlipidemia, unspecified: Secondary | ICD-10-CM | POA: Diagnosis present

## 2017-09-05 DIAGNOSIS — Z8249 Family history of ischemic heart disease and other diseases of the circulatory system: Secondary | ICD-10-CM

## 2017-09-05 DIAGNOSIS — I420 Dilated cardiomyopathy: Secondary | ICD-10-CM | POA: Diagnosis present

## 2017-09-05 DIAGNOSIS — I10 Essential (primary) hypertension: Secondary | ICD-10-CM | POA: Diagnosis present

## 2017-09-05 DIAGNOSIS — J9601 Acute respiratory failure with hypoxia: Secondary | ICD-10-CM | POA: Diagnosis present

## 2017-09-05 DIAGNOSIS — I509 Heart failure, unspecified: Secondary | ICD-10-CM

## 2017-09-05 DIAGNOSIS — I252 Old myocardial infarction: Secondary | ICD-10-CM

## 2017-09-05 DIAGNOSIS — Z79899 Other long term (current) drug therapy: Secondary | ICD-10-CM

## 2017-09-05 DIAGNOSIS — Z7984 Long term (current) use of oral hypoglycemic drugs: Secondary | ICD-10-CM

## 2017-09-05 DIAGNOSIS — R0602 Shortness of breath: Secondary | ICD-10-CM

## 2017-09-05 DIAGNOSIS — I493 Ventricular premature depolarization: Secondary | ICD-10-CM | POA: Diagnosis present

## 2017-09-05 DIAGNOSIS — I110 Hypertensive heart disease with heart failure: Principal | ICD-10-CM | POA: Diagnosis present

## 2017-09-05 DIAGNOSIS — R0902 Hypoxemia: Secondary | ICD-10-CM

## 2017-09-05 DIAGNOSIS — Z87891 Personal history of nicotine dependence: Secondary | ICD-10-CM

## 2017-09-05 DIAGNOSIS — Z6833 Body mass index (BMI) 33.0-33.9, adult: Secondary | ICD-10-CM

## 2017-09-05 LAB — BASIC METABOLIC PANEL
Anion gap: 9 (ref 5–15)
BUN: 17 mg/dL (ref 6–20)
CO2: 24 mmol/L (ref 22–32)
Calcium: 9.1 mg/dL (ref 8.9–10.3)
Chloride: 105 mmol/L (ref 101–111)
Creatinine, Ser: 0.96 mg/dL (ref 0.61–1.24)
GFR calc Af Amer: >60 mL/min (ref >60)
GFR calc non Af Amer: >60 mL/min (ref >60)
Glucose, Bld: 87 mg/dL (ref 65–99)
Potassium: 3.5 mmol/L (ref 3.5–5.1)
Sodium: 138 mmol/L (ref 135–145)

## 2017-09-05 LAB — I-STAT TROPONIN, ED: Troponin i, poc: 0.03 ng/mL (ref 0.00–0.08)

## 2017-09-05 LAB — CBC WITH DIFFERENTIAL/PLATELET
Basophils Absolute: 0 10*3/uL (ref 0.0–0.1)
Basophils Relative: 1 %
Eosinophils Absolute: 0.1 10*3/uL (ref 0.0–0.7)
Eosinophils Relative: 1 %
HCT: 40.8 % (ref 39.0–52.0)
Hemoglobin: 13.7 g/dL (ref 13.0–17.0)
Lymphocytes Relative: 40 %
Lymphs Abs: 2.8 10*3/uL (ref 0.7–4.0)
MCH: 27.2 pg (ref 26.0–34.0)
MCHC: 33.6 g/dL (ref 30.0–36.0)
MCV: 81.1 fL (ref 78.0–100.0)
Monocytes Absolute: 0.8 10*3/uL (ref 0.1–1.0)
Monocytes Relative: 11 %
Neutro Abs: 3.2 10*3/uL (ref 1.7–7.7)
Neutrophils Relative %: 47 %
Platelets: 231 10*3/uL (ref 150–400)
RBC: 5.03 MIL/uL (ref 4.22–5.81)
RDW: 13.7 % (ref 11.5–15.5)
WBC: 7 10*3/uL (ref 4.0–10.5)

## 2017-09-05 LAB — CBG MONITORING, ED: Glucose-Capillary: 73 mg/dL (ref 65–99)

## 2017-09-05 LAB — BRAIN NATRIURETIC PEPTIDE: B Natriuretic Peptide: 1021.1 pg/mL — ABNORMAL HIGH (ref 0.0–100.0)

## 2017-09-05 MED ORDER — VITAMIN D 1000 UNITS PO TABS
2000.0000 [IU] | ORAL_TABLET | Freq: Every day | ORAL | Status: DC
  Administered 2017-09-06 – 2017-09-08 (×3): 2000 [IU] via ORAL
  Filled 2017-09-05 (×3): qty 2

## 2017-09-05 MED ORDER — ALPRAZOLAM 0.25 MG PO TABS: 0.2500 mg | ORAL_TABLET | Freq: Two times a day (BID) | ORAL | Status: DC | PRN

## 2017-09-05 MED ORDER — INSULIN ASPART 100 UNIT/ML ~~LOC~~ SOLN
0.0000 [IU] | Freq: Three times a day (TID) | SUBCUTANEOUS | Status: DC
  Administered 2017-09-06: 1 [IU] via SUBCUTANEOUS
  Administered 2017-09-06 – 2017-09-07 (×2): 2 [IU] via SUBCUTANEOUS
  Administered 2017-09-07 (×2): 1 [IU] via SUBCUTANEOUS
  Administered 2017-09-08 (×3): 2 [IU] via SUBCUTANEOUS

## 2017-09-05 MED ORDER — ASPIRIN EC 81 MG PO TBEC
81.0000 mg | DELAYED_RELEASE_TABLET | Freq: Every day | ORAL | Status: DC
  Administered 2017-09-06 – 2017-09-08 (×3): 81 mg via ORAL
  Filled 2017-09-05 (×3): qty 1

## 2017-09-05 MED ORDER — FUROSEMIDE 10 MG/ML IJ SOLN
40.0000 mg | Freq: Once | INTRAMUSCULAR | Status: AC
  Administered 2017-09-05: 40 mg via INTRAVENOUS
  Filled 2017-09-05: qty 4

## 2017-09-05 MED ORDER — ONDANSETRON HCL 4 MG/2ML IJ SOLN: 4.0000 mg | Freq: Four times a day (QID) | INTRAMUSCULAR | Status: DC | PRN

## 2017-09-05 MED ORDER — DILTIAZEM HCL ER 60 MG PO CP12
60.0000 mg | ORAL_CAPSULE | Freq: Two times a day (BID) | ORAL | Status: DC
  Filled 2017-09-05: qty 1

## 2017-09-05 MED ORDER — ACETAMINOPHEN 325 MG PO TABS
650.0000 mg | ORAL_TABLET | ORAL | Status: DC | PRN
Start: 2017-09-05 — End: 2017-09-09

## 2017-09-05 MED ORDER — ENOXAPARIN SODIUM 40 MG/0.4ML ~~LOC~~ SOLN
40.0000 mg | SUBCUTANEOUS | Status: DC
  Administered 2017-09-05 – 2017-09-08 (×4): 40 mg via SUBCUTANEOUS
  Filled 2017-09-05 (×4): qty 0.4

## 2017-09-05 MED ORDER — ALBUTEROL SULFATE HFA 108 (90 BASE) MCG/ACT IN AERS: 1.0000 | INHALATION_SPRAY | RESPIRATORY_TRACT | Status: DC | PRN

## 2017-09-05 MED ORDER — DILTIAZEM HCL 30 MG PO TABS
30.0000 mg | ORAL_TABLET | Freq: Four times a day (QID) | ORAL | Status: DC
  Administered 2017-09-05 – 2017-09-07 (×6): 30 mg via ORAL
  Filled 2017-09-05 (×6): qty 1

## 2017-09-05 MED ORDER — ALBUTEROL SULFATE (2.5 MG/3ML) 0.083% IN NEBU: 2.5000 mg | INHALATION_SOLUTION | RESPIRATORY_TRACT | Status: DC | PRN

## 2017-09-05 MED ORDER — HYDRALAZINE HCL 20 MG/ML IJ SOLN
10.0000 mg | INTRAMUSCULAR | Status: DC | PRN
  Administered 2017-09-05 – 2017-09-06 (×2): 10 mg via INTRAVENOUS
  Filled 2017-09-05 (×2): qty 1

## 2017-09-05 MED ORDER — SODIUM CHLORIDE 0.9 % IV SOLN: 250.0000 mL | INTRAVENOUS | Status: DC | PRN

## 2017-09-05 MED ORDER — SODIUM CHLORIDE 0.9% FLUSH
3.0000 mL | Freq: Two times a day (BID) | INTRAVENOUS | Status: DC
  Administered 2017-09-05 – 2017-09-08 (×6): 3 mL via INTRAVENOUS

## 2017-09-05 MED ORDER — FUROSEMIDE 10 MG/ML IJ SOLN
40.0000 mg | Freq: Two times a day (BID) | INTRAMUSCULAR | Status: DC
  Administered 2017-09-06 – 2017-09-07 (×3): 40 mg via INTRAVENOUS
  Filled 2017-09-05 (×3): qty 4

## 2017-09-05 MED ORDER — MOMETASONE FURO-FORMOTEROL FUM 200-5 MCG/ACT IN AERO
2.0000 | INHALATION_SPRAY | Freq: Two times a day (BID) | RESPIRATORY_TRACT | Status: DC
  Administered 2017-09-06 – 2017-09-08 (×5): 2 via RESPIRATORY_TRACT
  Filled 2017-09-05: qty 8.8

## 2017-09-05 MED ORDER — SODIUM CHLORIDE 0.9% FLUSH: 3.0000 mL | INTRAVENOUS | Status: DC | PRN

## 2017-09-05 MED ORDER — POTASSIUM CHLORIDE CRYS ER 20 MEQ PO TBCR
20.0000 meq | EXTENDED_RELEASE_TABLET | Freq: Two times a day (BID) | ORAL | Status: DC
  Administered 2017-09-05 – 2017-09-08 (×7): 20 meq via ORAL
  Filled 2017-09-05 (×7): qty 1

## 2017-09-05 MED ORDER — INSULIN ASPART 100 UNIT/ML ~~LOC~~ SOLN: 0.0000 [IU] | Freq: Every day | SUBCUTANEOUS | Status: DC

## 2017-09-05 MED ORDER — ZOLPIDEM TARTRATE 5 MG PO TABS: 5.0000 mg | ORAL_TABLET | Freq: Every evening | ORAL | Status: DC | PRN

## 2017-09-05 MED ORDER — CARVEDILOL 3.125 MG PO TABS
3.1250 mg | ORAL_TABLET | Freq: Two times a day (BID) | ORAL | Status: DC
  Administered 2017-09-06: 3.125 mg via ORAL
  Filled 2017-09-05: qty 1

## 2017-09-05 NOTE — ED Provider Notes (Signed)
[facility]-EMERGENCY DEPT Provider Note   CSN: [id number] Arrival date & time: 09/05/17  1425     History   Chief Complaint Chief Complaint  Patient presents with  . Shortness of Breath    HPI Ayhan Hemp is a 62 y.o. male with a PMHx of CHF, DM2, HTN, HLD, PVCs, and other conditions listed below, who presents to the ED with complaints of gradually worsening SOB and dry cough. Chart review reveals he was seen on 08/27/17 for worsening dyspnea x1 month, had labs that showed neg d-dimer, +BNP of 941.8, CXR neg, and otherwise remainder of work up negative; he was presumptively diagnosed with CHF, EDP attempted to admit pt but [facility] provider declined the admission so pt was discharged home with lasix rx (40mg  x3 days then 20mg  thereafter).  He states that he has been compliant with the Lasix but has continued to gradually worsen.  He reports shortness of breath particularly with exertion or activity, dry cough, orthopnea, occasional wheezing, and unexpected weight gain stating that he went from 213# on 08/27/17 to 215# yesterday and then 218# today.  Symptoms worsen with exertion and activity, and have been unrelieved with Lasix, no other treatments tried prior to arrival.  Positive family history of MI in his brother at 75 years old, no other known family history of cardiac disease.  He is a non-smoker (former smoker who quit 36yrs ago).  No known hx of COPD/asthma.  Previously was seen by cardiology [facility]) in 2014, had a negative stress test, but never had an echo, and has not continued to f/up with cardiology.  He has no PCP at this time.    He denies diaphoresis, lightheadedness, fevers, chills, URI symptoms, hemoptysis, CP, LE swelling, recent travel/surgery/immobilization, personal/family hx of DVT/PE, abd pain, N/V/D/C, hematuria, dysuria, myalgias, arthralgias, claudication, numbness, tingling, focal weakness, or any other complaints at this time.    The history is  provided by the patient and medical records. No language interpreter was used.  Shortness of Breath  This is a recurrent problem. The average episode lasts 6 days. The problem occurs continuously.The current episode started more than 2 days ago. The problem has been gradually worsening. Associated symptoms include cough, wheezing and orthopnea. Pertinent negatives include no fever, no rhinorrhea, no sore throat, no sputum production, no hemoptysis, no chest pain, no vomiting, no abdominal pain, no leg swelling and no claudication. It is unknown what precipitated the problem. Treatments tried: lasix. The treatment provided no relief. He has had no prior hospitalizations. He has had prior ED visits. He has had no prior ICU admissions. Associated medical issues include heart failure (presumptively diagnosed in ED on 08/27/17). Associated medical issues do not include asthma, COPD, PE, past MI, DVT or recent surgery.    Past Medical History:  Diagnosis Date  . Anxiety   . Diabetes mellitus type II, controlled, with no complications (HCC)   . Hepatitis C, chronic (HCC)   . Hypertension   . Obesity, Class II, BMI 35-39.9, with comorbidity   . Obesity, diabetes, and hypertension syndrome (HCC)   . Substance abuse [facility])     Patient Active Problem List   Diagnosis Date Noted  . Hypertrophy of prostate without urinary obstruction and other lower urinary tract symptoms (LUTS) 12/03/2013  . Erectile dysfunction 08/20/2013  . Personal history of colonic polyps 07/20/2013  . Exertional dyspnea 06/01/2013    Class: Acute  . Obesity (BMI 30-39.9) 06/01/2013  . Abnormal resting ECG findings - bifascicular  block with PVCs 06/01/2013  . DM II (diabetes mellitus, type II), controlled (HCC) 05/28/2013  . DYSLIPIDEMIA 02/11/2007  . ANXIETY DISORDER 02/11/2007  . ALCOHOL USE 02/11/2007  . Essential hypertension 02/11/2007    Past Surgical History:  Procedure Laterality Date  . NM  EXERCISE MYOVIEW LTD   06-2013   No ischemia or infarction; note dated images       Home Medications    Prior to Admission medications   Medication Sig Start Date End Date Taking? Authorizing Provider  aspirin 81 MG tablet Take 81 mg by mouth daily.    [provider]  Blood Glucose Monitoring Suppl SUPPLIES MISC Use as directed 08/20/13   Maurice March, MD  carvedilol (COREG) 3.125 MG tablet TAKE 1 TABLET BY MOUTH TWICE DAILY WITH A MEAL Patient taking differently: TAKE 1 TABLET BY MOUTH DAILY WITH A MEAL 05/15/15   Wallis Bamberg, PA-C  Cholecalciferol 1000 units tablet Take 2,000 Units by mouth daily.    [provider]  desloratadine (CLARINEX) 5 MG tablet Take 1 tablet (5 mg total) by mouth daily. Patient not taking: Reported on 08/27/2017 04/20/15   Wallis Bamberg, PA-C  Diclofenac Sodium 1 % CREA Place 1 application onto the skin daily as needed. Patient not taking: Reported on 08/27/2017 04/20/15   Wallis Bamberg, PA-C  diltiazem (CARDIZEM SR) 60 MG 12 hr capsule TAKE 1 CAPSULE(60 MG) BY MOUTH TWICE DAILY 05/26/15   Wallis Bamberg, PA-C  famotidine (PEPCID) 20 MG tablet Take 1 tablet (20 mg total) by mouth 2 (two) times daily. Patient not taking: Reported on 08/27/2017 06/29/15   Wallis Bamberg, PA-C  furosemide (LASIX) 20 MG tablet Take 1 tablet (20 mg total) by mouth daily. Take 40mg  daily for 3 days, followed by 20mg  daily or as directed by your physician 08/27/17   Alvira Monday, MD  glipiZIDE (GLUCOTROL XL) 10 MG 24 hr tablet TAKE 2 TABLETS BY MOUTH DAILY 08/08/15   Wallis Bamberg, PA-C  hydrochlorothiazide (HYDRODIURIL) 25 MG tablet Take 1 tablet (25 mg total) by mouth daily. Patient not taking: Reported on 08/27/2017 05/25/15   Wallis Bamberg, PA-C  metFORMIN (GLUCOPHAGE) 1000 MG tablet Take 1 tablet (1,000 mg total) by mouth 2 (two) times daily with a meal. 04/20/15   Wallis Bamberg, PA-C  sildenafil (REVATIO) 20 MG tablet Take 1 tablet (20 mg total) by mouth as needed. Patient not taking:  Reported on 08/27/2017 04/20/15   Wallis Bamberg, PA-C  traMADol (ULTRAM) 50 MG tablet TAKE 1 TO 2 TABLETS BY MOUTH EVERY NIGHT AT BEDTIME AS NEEDED Patient not taking: Reported on 08/27/2017 07/25/15   Wallis Bamberg, PA-C    Family History Family History  Problem Relation Age of Onset  . Stroke Father   . Diabetes Father   . Hypertension Father   . Cancer Sister   . Cerebral aneurysm Mother        Died at a young age.  . Diabetes Paternal Grandmother   . Hypertension Paternal Grandmother   . Stroke Paternal Grandmother     Social History Social History   Tobacco Use  . Smoking status: Former Smoker    Packs/day: 0.50    Years: 10.00    Pack years: 5.00    Types: Cigarettes    Last attempt to quit: 10/04/1983    Years since quitting: 33.9  . Smokeless tobacco: Never Used  Substance Use Topics  . Alcohol use: Yes    Alcohol/week: 3.0 oz    Types: 6  Standard drinks or equivalent per week  . Drug use: No     Allergies   Ace inhibitors and Amlodipine   Review of Systems Review of Systems  Constitutional: Positive for unexpected weight change. Negative for chills, diaphoresis and fever.  HENT: Negative for rhinorrhea and sore throat.   Respiratory: Positive for cough, shortness of breath and wheezing. Negative for hemoptysis and sputum production.        +orthopnea  Cardiovascular: Positive for orthopnea. Negative for chest pain, claudication and leg swelling.  Gastrointestinal: Negative for abdominal pain, constipation, diarrhea, nausea and vomiting.  Genitourinary: Negative for dysuria and hematuria.  Musculoskeletal: Negative for arthralgias and myalgias.  Skin: Negative for color change.  Allergic/Immunologic: Positive for immunocompromised state (DM2, HepC).  Neurological: Negative for weakness, light-headedness and numbness.  Psychiatric/Behavioral: Negative for confusion.   All other systems reviewed and are negative for acute change except as noted in the HPI.     Physical Exam Updated Vital Signs BP (!) 160/114   Pulse (!) 101   Temp 98.4 F (36.9 C) (Oral)   Resp 18   Ht 5\' 7"  (1.702 m)   Wt 98.9 kg (218 lb)   SpO2 92%   BMI 34.14 kg/m   Physical Exam  Constitutional: He is oriented to person, place, and time. Vital signs are normal. He appears well-developed and well-nourished.  Non-toxic appearance. No distress.  Afebrile, nontoxic, NAD  HENT:  Head: Normocephalic and atraumatic.  Mouth/Throat: Oropharynx is clear and moist and mucous membranes are normal.  Eyes: Conjunctivae and EOM are normal. Right eye exhibits no discharge. Left eye exhibits no discharge.  Neck: Normal range of motion. Neck supple.  No appreciable JVD although pt has thick neck which obscures adequate visualization of jugular veins  Cardiovascular: Normal rate, regular rhythm, normal heart sounds and intact distal pulses. Exam reveals no gallop and no friction rub.  No murmur heard. HR 98-102 during evaluation, reg rhythm, nl s1/s2, no m/r/g, distal pulses intact, trace b/l pedal edema   Pulmonary/Chest: Effort normal. Tachypnea noted. No respiratory distress. He has no decreased breath sounds. He has no wheezes. He has no rhonchi. He has rales.  Mildly tachypneic RR 28 during evaluation, no significant increased WOB but speaking in slightly shortened sentences; mild bibasilar rales appreciated, no wheezing/rhonchi, no hypoxia during evaluation but SpO2 reports as 87% in triage; SpO2 94% on RA during evaluation while pt speaks.   Abdominal: Soft. Normal appearance and bowel sounds are normal. He exhibits no distension. There is no tenderness. There is no rigidity, no rebound, no guarding, no CVA tenderness, no tenderness at McBurney's point and negative Murphy's sign.  Musculoskeletal: Normal range of motion.  MAE x4 Strength and sensation grossly intact in all extremities Distal pulses intact Gait steady Trace b/l pedal edema, neg homan's bilaterally    Neurological: He is alert and oriented to person, place, and time. He has normal strength. No sensory deficit.  Skin: Skin is warm, dry and intact. No rash noted.  Psychiatric: He has a normal mood and affect.  Nursing note and vitals reviewed.   Wt Readings from Last 3 Encounters:  09/05/17 101.1 kg (222 lb 12.8 oz)  08/27/17 96.2 kg (212 lb)    ED Treatments / Results  Labs (all labs ordered are listed, but only abnormal results are displayed) Labs Reviewed  BRAIN NATRIURETIC PEPTIDE - Abnormal; Notable for the following components:      Result Value   B Natriuretic Peptide 1,021.1 (*)  All other components within normal limits  CBC WITH DIFFERENTIAL/PLATELET  BASIC METABOLIC PANEL  I-STAT TROPONIN, ED    EKG  EKG Interpretation  Date/Time:  Friday September 05 2017 14:33:21 EST Ventricular Rate:  100 PR Interval:    QRS Duration: 116 QT Interval:  376 QTC Calculation: 485 R Axis:   -63 Text Interpretation:  Sinus tachycardia Ventricular trigeminy Left anterior fascicular block Anterior infarct, old Baseline wander in lead(s) V6 Confirmed by Tilden Fossa [phone]) on 09/05/2017 5:15:10 PM       Radiology Dg Chest 2 View  Result Date: 09/05/2017 CLINICAL DATA:  Increasing shortness of breath and dry cough. EXAM: CHEST  2 VIEW COMPARISON:  08/27/2017 FINDINGS: Enlarged cardiac silhouette. There is no evidence of focal airspace consolidation, pleural effusion or pneumothorax. Mild interstitial pulmonary edema appear Osseous structures are without acute abnormality. Soft tissues are grossly normal. IMPRESSION: Mild interstitial pulmonary edema with enlarged cardiac silhouette, which may represent cardiomegaly or pericardial effusion. Electronically Signed   By: Ted Mcalpine M.D.   On: 09/05/2017 15:10    Myoview Stress Test 06/03/13: negative    Procedures Procedures (including critical care time)  Medications Ordered in ED Medications  furosemide (LASIX)  injection 40 mg (40 mg Intravenous Given 09/05/17 1747)     Initial Impression / Assessment and Plan / ED Course  I have reviewed the triage vital signs and the nursing notes.  Pertinent labs & imaging results that were available during my care of the patient were reviewed by me and considered in my medical decision making (see chart for details).     62 y.o. male here with gradually worsening SOB and dry cough x6 days, orthopnea, occasional wheezing, and weight gain (3lbs since yesterday, 5lbs since 9 days ago). He was presumptively diagnosed with CHF at his last ED visit on 08/27/17 due to elevated BNP, started on lasix but hasn't noticed any improvement. On exam today, mildly tachypneic and SpO2 94% on RA when speaking (in triage his SpO2 was reported as 87% on RA), mildly tachycardic HR ranging from 98-102, no m/r/g appreciated, trace b/l pedal edema, neg homan's sign bilaterally, minimal bibasilar rales, no wheezing/rhonchi. EKG with PVCs/ventricular trigeminy and LAFB somewhat similar to prior visits. CXR showing mild interstitial pulmonary edema and enlarged cardiac silhouette (cardiomegaly vs pericardial effusion). Clinically his symptoms match acute CHF, although I would expect more peripheral edema. Doubt PE, pt denies any CP, and had a neg dimer at his last visit, and has no risk factors for this. Will weigh patient to get accurate weight, and get CBC w/diff, BMP, trop, and BNP. Will give lasix and reassess. Pt may need admission given initially hypoxia and tachypnea, especially if it persists. Discussed case with my attending Dr. Madilyn Hook who agrees with plan.   7:34 PM CBC w/diff WNL. BMP WNL. Trop neg. BNP 1021.1, increased from 941.8 nine days ago. Pt weighs 222lb 12.8oz today, which is 10lbs since 08/27/17 (per chart review). On repeat eval, pt less tachypneic, however his SpO2 is 88% on RA, although pt states he feels subjectively slightly better. Will place on 2L via [place], and proceed with  admission for acute new-onset CHF with hypoxia which is worsening despite PO lasix at home.   7:45 PM Dr. [name] of [facility] returning page and will admit. Holding orders to be placed by admitting team. Please see their notes for further documentation of care. I appreciate their help with this pleasant pt's care. Pt stable at time of admission.  Final Clinical Impressions(s) / ED Diagnoses   Final diagnoses:  Acute congestive heart failure, unspecified heart failure type (HCC)  Hypoxia  SOB (shortness of breath) on exertion    ED Discharge Orders    [address], [place], [place] 09/05/17 1945    Tilden Fossa, MD 09/09/17 [phone]

## 2017-09-05 NOTE — ED Notes (Signed)
Attempted to call report, RN will call back in few minutes.

## 2017-09-05 NOTE — H&P (Signed)
History and Physical    Todd Greer:[id number] DOB: 1956-06-08 DOA: 09/05/2017  PCP: Ethelda Chick, MD   Patient coming from: Home  Chief Complaint: SOB   HPI: Todd Greer is a 62 y.o. male with medical history significant for type 2 diabetes mellitus, hypertension, hepatitis C status post treatment with Harvoni, and recent dyspnea and orthopnea that was attributed to CHF, now presenting to the emergency department with weight gain, worsening shortness of breath, and progressive orthopnea.  Patient was seen in the emergency department for similar complaints approximately 10 days ago, noted to have elevated BNP, diagnosed with CHF, and started on Lasix.  Despite taking the Lasix, his symptoms have continued to worsen and he reports a 3-5 pound weight gain over the past couple days.  He has not had an echocardiogram.  Denies chest pain.  No fevers or chills.  ED Course: Upon arrival to the ED, patient is found to be afebrile, saturating mid 80s on room air and 160/105, and with vitals otherwise normal.  EKG features a sinus tachycardia with rate 100, frequent PVCs, LAFB, and nonspecific ST abnormalities.  Chest x-ray features mild interstitial pulmonary edema and increased cardiac silhouette.  Chemistry panel and CBC are unremarkable.  Troponin is normal and BNP is elevated to 1021. The patient was treated with 40 mg IV Lasix and started on supplemental oxygen.  He will be admitted to the telemetry unit for ongoing evaluation and management of hypoxia with shortness of breath and orthopnea, suspected secondary to acute CHF.  Review of Systems:  All other systems reviewed and apart from HPI, are negative.  Past Medical History:  Diagnosis Date  . Anxiety   . Diabetes mellitus type II, controlled, with no complications (HCC)   . Hepatitis C, chronic (HCC)   . Hypertension   . Obesity, Class II, BMI 35-39.9, with comorbidity   . Obesity, diabetes, and hypertension syndrome (HCC)   .  Substance abuse [facility])     Past Surgical History:  Procedure Laterality Date  . NM  EXERCISE MYOVIEW LTD  06-2013   No ischemia or infarction; note dated images     reports that he quit smoking about 33 years ago. His smoking use included cigarettes. He has a 5.00 pack-year smoking history. he has never used smokeless tobacco. He reports that he drinks about 3.0 oz of alcohol per week. He reports that he does not use drugs.  Allergies  Allergen Reactions  . Ace Inhibitors Cough    Tolerates ARB  . Amlodipine Other (See Comments)    Fatigue    Family History  Problem Relation Age of Onset  . Stroke Father   . Diabetes Father   . Hypertension Father   . Cancer Sister   . Cerebral aneurysm Mother        Died at a young age.  . Diabetes Paternal Grandmother   . Hypertension Paternal Grandmother   . Stroke Paternal Grandmother      Prior to Admission medications   Medication Sig Start Date End Date Taking? Authorizing Provider  albuterol (PROVENTIL HFA) 108 (90 Base) MCG/ACT inhaler Inhale into the lungs every 6 (six) hours as needed for wheezing or shortness of breath.   Yes [provider]  aspirin 81 MG tablet Take 81 mg by mouth daily.   Yes [provider]  budesonide-formoterol (SYMBICORT) 160-4.5 MCG/ACT inhaler Inhale 2 puffs into the lungs 2 (two) times daily.   Yes [provider]  carvedilol (COREG) 3.125  MG tablet TAKE 1 TABLET BY MOUTH TWICE DAILY WITH A MEAL Patient taking differently: TAKE 1 TABLET BY MOUTH DAILY WITH A MEAL 05/15/15  Yes Wallis Bamberg, PA-C  Cholecalciferol 1000 units tablet Take 2,000 Units by mouth daily.   Yes [provider]  diltiazem (CARDIZEM SR) 60 MG 12 hr capsule TAKE 1 CAPSULE(60 MG) BY MOUTH TWICE DAILY 05/26/15  Yes Wallis Bamberg, PA-C  furosemide (LASIX) 20 MG tablet Take 1 tablet (20 mg total) by mouth daily. Take 40mg  daily for 3 days, followed by 20mg  daily or as directed by your physician 08/27/17   Yes Alvira Monday, MD  glipiZIDE (GLUCOTROL XL) 10 MG 24 hr tablet TAKE 2 TABLETS BY MOUTH DAILY 08/08/15  Yes Wallis Bamberg, PA-C  metFORMIN (GLUCOPHAGE) 1000 MG tablet Take 1 tablet (1,000 mg total) by mouth 2 (two) times daily with a meal. 04/20/15  Yes Wallis Bamberg, PA-C  Multiple Vitamin (ONE-A-DAY MENS PO) Take 1 tablet by mouth daily.   Yes [provider]  Blood Glucose Monitoring Suppl SUPPLIES MISC Use as directed 08/20/13   Maurice March, MD    Physical Exam: Vitals:   09/05/17 1437 09/05/17 1653 09/05/17 1757 09/05/17 1853  BP:  (!) 160/114  (!) 160/105  Pulse:  (!) 101  91  Resp:  18  16  Temp:      TempSrc:      SpO2:  92%  99%  Weight: 98.9 kg (218 lb)  101.1 kg (222 lb 12.8 oz)   Height: 5\' 7"  (1.702 m)         Constitutional: Dyspneic with speech, but not in acute distress; no pallor  Eyes: PERTLA, lids and conjunctivae normal ENMT: Mucous membranes are moist. Posterior pharynx clear of any exudate or lesions.   Neck: normal, supple, no masses, no thyromegaly Respiratory: Dyspnea with speech, mild tachypnea.  No accessory muscle use.  Cardiovascular: S1 & S2 heard, regular rate and rhythm. No extremity edema.  Abdomen: No distension, no tenderness, no masses palpated. Bowel sounds normal.  Musculoskeletal: no clubbing / cyanosis. No joint deformity upper and lower extremities.    Skin: no significant rashes, lesions, ulcers. Warm, dry, well-perfused. Neurologic: CN 2-12 grossly intact. Sensation intact. Strength 5/5 in all 4 limbs.  Psychiatric: Alert and oriented x 3. Pleasant, cooperative.     Labs on Admission: I have personally reviewed following labs and imaging studies  CBC: Recent Labs  Lab 09/05/17 1739  WBC 7.0  NEUTROABS 3.2  HGB 13.7  HCT 40.8  MCV 81.1  PLT 231   Basic Metabolic Panel: Recent Labs  Lab 09/05/17 1739  NA 138  K 3.5  CL 105  CO2 24  GLUCOSE 87  BUN 17  CREATININE 0.96  CALCIUM 9.1   GFR: Estimated  Creatinine Clearance: 91.5 mL/min (by C-G formula based on SCr of 0.96 mg/dL). Liver Function Tests: No results for input(s): AST, ALT, ALKPHOS, BILITOT, PROT, ALBUMIN in the last 168 hours. No results for input(s): LIPASE, AMYLASE in the last 168 hours. No results for input(s): AMMONIA in the last 168 hours. Coagulation Profile: No results for input(s): INR, PROTIME in the last 168 hours. Cardiac Enzymes: No results for input(s): CKTOTAL, CKMB, CKMBINDEX, TROPONINI in the last 168 hours. BNP (last 3 results) No results for input(s): PROBNP in the last 8760 hours. HbA1C: No results for input(s): HGBA1C in the last 72 hours. CBG: No results for input(s): GLUCAP in the last 168 hours. Lipid Profile: No results for  input(s): CHOL, HDL, LDLCALC, TRIG, CHOLHDL, LDLDIRECT in the last 72 hours. Thyroid Function Tests: No results for input(s): TSH, T4TOTAL, FREET4, T3FREE, THYROIDAB in the last 72 hours. Anemia Panel: No results for input(s): VITAMINB12, FOLATE, FERRITIN, TIBC, IRON, RETICCTPCT in the last 72 hours. Urine analysis:    Component Value Date/Time   BILIRUBINUR neg 05/28/2013 1059   PROTEINUR neg 05/28/2013 1059   UROBILINOGEN 1.0 05/28/2013 1059   NITRITE neg 05/28/2013 1059   LEUKOCYTESUR Negative 05/28/2013 1059   Sepsis Labs: @LABRCNTIP (procalcitonin:4,lacticidven:4) )No results found for this or any previous visit (from the past 240 hour(s)).   Radiological Exams on Admission: Dg Chest 2 View  Result Date: 09/05/2017 CLINICAL DATA:  Increasing shortness of breath and dry cough. EXAM: CHEST  2 VIEW COMPARISON:  08/27/2017 FINDINGS: Enlarged cardiac silhouette. There is no evidence of focal airspace consolidation, pleural effusion or pneumothorax. Mild interstitial pulmonary edema appear Osseous structures are without acute abnormality. Soft tissues are grossly normal. IMPRESSION: Mild interstitial pulmonary edema with enlarged cardiac silhouette, which may represent  cardiomegaly or pericardial effusion. Electronically Signed   By: Ted Mcalpine M.D.   On: 09/05/2017 15:10    EKG: Independently reviewed. Sinus tachycardia (rate 100), frequent PVC's, non-specific ST abnormality in diffuse leads.   Assessment/Plan  1. Acute CHF; acute hypoxic respiratory failure  - Presents with dyspnea, orthopnea, and wt gain  - Found to have elevated BNP and interstitial edema on CXR  - Question of pericardial effusion on CXR and pericardial disease on EKG; no chest pain and no clinical tamponade  - Treated in ED with Lasix 40 mg IV  - Continue diuresis with Lasix 40 mg IV q12h, SLIV, fluid-restrict diet, follow daily wts and I/O's, obtain echocardiogram, follow daily chem panel during diuresis, continue Coreg    2. Hypertension  - BP elevated in setting of acute CHF, anticipate improvement with diuresis  - Continue Coreg and diltiazem; use hydralazine IVP's prn    3. Type II DM  - A1c was 7.6% remotely  - Managed at home with glipizide and metformin, held on admission  - Check CBG's and start a low-intensity SSI with Novolog     DVT prophylaxis: Lovenox Code Status: Full  Family Communication: Significant other updated at bedside Disposition Plan: Admit to telemetry Consults called: None Admission status: Inpatient    Briscoe Deutscher, MD [facility] Pager [phone]  If 7PM-7AM, please contact night-coverage [email or web address] Password [facility]  09/05/2017, 7:55 PM

## 2017-09-05 NOTE — ED Triage Notes (Signed)
Patient c/o SOB and a dry cough since last night. Patient reports a history of CHF. Patient denies any extremity swelling, but states he gained 3 lbs since yesterday morning.

## 2017-09-06 ENCOUNTER — Inpatient Hospital Stay (HOSPITAL_COMMUNITY): Payer: Self-pay

## 2017-09-06 HISTORY — PX: TRANSTHORACIC ECHOCARDIOGRAM: SHX275

## 2017-09-06 LAB — BASIC METABOLIC PANEL
Anion gap: 9 (ref 5–15)
BUN: 16 mg/dL (ref 6–20)
CO2: 24 mmol/L (ref 22–32)
Calcium: 9.1 mg/dL (ref 8.9–10.3)
Chloride: 105 mmol/L (ref 101–111)
Creatinine, Ser: 1 mg/dL (ref 0.61–1.24)
GFR calc Af Amer: >60 mL/min (ref >60)
GFR calc non Af Amer: >60 mL/min (ref >60)
Glucose, Bld: 125 mg/dL — ABNORMAL HIGH (ref 65–99)
Potassium: 3.5 mmol/L (ref 3.5–5.1)
Sodium: 138 mmol/L (ref 135–145)

## 2017-09-06 LAB — GLUCOSE, CAPILLARY
Glucose-Capillary: 135 mg/dL — ABNORMAL HIGH (ref 65–99)
Glucose-Capillary: 118 mg/dL — ABNORMAL HIGH (ref 65–99)
Glucose-Capillary: 175 mg/dL — ABNORMAL HIGH (ref 65–99)
Glucose-Capillary: 144 mg/dL — ABNORMAL HIGH (ref 65–99)

## 2017-09-06 LAB — ECHOCARDIOGRAM COMPLETE
Height: 67 in
Weight: 3372.16 oz

## 2017-09-06 LAB — HIV ANTIBODY (ROUTINE TESTING W REFLEX): HIV Screen 4th Generation wRfx: NONREACTIVE

## 2017-09-06 MED ORDER — POTASSIUM CHLORIDE CRYS ER 20 MEQ PO TBCR
40.0000 meq | EXTENDED_RELEASE_TABLET | Freq: Once | ORAL | Status: AC
  Administered 2017-09-06: 40 meq via ORAL
  Filled 2017-09-06: qty 2

## 2017-09-06 MED ORDER — BENZONATATE 100 MG PO CAPS
100.0000 mg | ORAL_CAPSULE | Freq: Two times a day (BID) | ORAL | Status: DC | PRN
  Administered 2017-09-06 (×2): 100 mg via ORAL
  Filled 2017-09-06 (×2): qty 1

## 2017-09-06 MED ORDER — BENZONATATE 100 MG PO CAPS
100.0000 mg | ORAL_CAPSULE | Freq: Four times a day (QID) | ORAL | Status: DC | PRN
  Administered 2017-09-06 – 2017-09-08 (×5): 100 mg via ORAL
  Filled 2017-09-06 (×5): qty 1

## 2017-09-06 MED ORDER — MAGNESIUM SULFATE 2 GM/50ML IV SOLN
2.0000 g | Freq: Once | INTRAVENOUS | Status: AC
  Administered 2017-09-06: 2 g via INTRAVENOUS
  Filled 2017-09-06: qty 50

## 2017-09-06 MED ORDER — CARVEDILOL 6.25 MG PO TABS
6.2500 mg | ORAL_TABLET | Freq: Two times a day (BID) | ORAL | Status: DC
  Administered 2017-09-06 – 2017-09-08 (×5): 6.25 mg via ORAL
  Filled 2017-09-06 (×5): qty 1

## 2017-09-06 NOTE — Progress Notes (Signed)
  Echocardiogram 2D Echocardiogram has been performed.  Todd Greer F 09/06/2017, 3:12 PM

## 2017-09-06 NOTE — Plan of Care (Signed)
  Education: Ability to demonstrate management of disease process will improve 09/06/2017 1836 - Progressing by Shirleen Schirmer, RN   Activity: Capacity to carry out activities will improve 09/06/2017 1836 - Progressing by Shirleen Schirmer, RN   Cardiac: Ability to achieve and maintain adequate cardiopulmonary perfusion will improve 09/06/2017 1836 - Progressing by Shirleen Schirmer, RN

## 2017-09-06 NOTE — Progress Notes (Signed)
PROGRESS NOTE    Todd Greer  ZOX:[id number] DOB: 1956/06/14 DOA: 09/05/2017 PCP: Ethelda Chick, MD  Brief Narrative:62 y.o. male with medical history significant for type 2 diabetes mellitus, hypertension, hepatitis C status post treatment with Harvoni, and recent dyspnea and orthopnea that was attributed to CHF, now presenting to the emergency department with weight gain, worsening shortness of breath, and progressive orthopnea.  Patient was seen in the emergency department for similar complaints approximately 10 days ago, noted to have elevated BNP, diagnosed with CHF, and started on Lasix.  Despite taking the Lasix, his symptoms have continued to worsen and he reports a 3-5 pound weight gain over the past couple days.  He has not had an echocardiogram.  Denies chest pain.  No fevers or chills.  ED Course: Upon arrival to the ED, patient is found to be afebrile, saturating mid 80s on room air and 160/105, and with vitals otherwise normal.  EKG features a sinus tachycardia with rate 100, frequent PVCs, LAFB, and nonspecific ST abnormalities.  Chest x-ray features mild interstitial pulmonary edema and increased cardiac silhouette.  Chemistry panel and CBC are unremarkable.  Troponin is normal and BNP is elevated to 1021. The patient was treated with 40 mg IV Lasix and started on supplemental oxygen.  He will be admitted to the telemetry unit for ongoing evaluation and management of hypoxia with shortness of breath and orthopnea, suspected secondary to acute CHF.  09/06/2017-patient feels a bit his breathing is better but still has dyspnea on exertion.  He does not have a primary care physician and he does have a cardiologist but he has not followed up with him.  Patient had 6 beat run of V. tach patient has a history of PVCs at home.  He does complain of a dry cough.   Assessment & Plan:   Principal Problem:   CHF, acute on chronic (HCC) Active Problems:   Essential hypertension   DM II (diabetes  mellitus, type II), controlled (HCC)   Acute respiratory failure with hypoxia (HCC)  Acute congestive heart failure with hypoxia-echocardiogram ordered.  Patient presented with dyspnea orthopnea and increasing lower weight gain.  Around the time of Christmas he came to the ER he was discharged on IV Lasix now readmitted again last night with the same complaints.  Possible dietary noncompliance.  New with IV diuresis beta-blockers.  Follow-up echocardiogram.  Obtain electrolytes potassium magnesium keep above 4 and 2.  Increase the dose of Coreg as patient can tolerate.  Patient does complain of a dry cough associated with CHF.  Chest x-ray showedMild interstitial pulmonary edema with enlarged cardiac silhouette, which may represent cardiomegaly or pericardial effusion.  He is negative by 2657 since admit.  He has lost over 6 pounds since admission.   Hypertension blood pressure better after starting diuresis and Coreg and diltiazem.  Type 2 diabetes patient takes metformin at home which is currently on home continue SSI.   DVT prophylaxis: Lovenox Code Status: Full code Family Communication none TBD :Disposition Plan: TBD  Consultants:  None Procedures:  None Antimicrobials none   Subjective: Feels better but still short of breath on exertion   Objective: Vitals:   09/05/17 2319 09/05/17 2358 09/06/17 0521 09/06/17 0611  BP: (!) 155/104 (!) 141/78 (!) 159/99 117/87  Pulse: (!) 51   70  Resp: 16  17   Temp: (!) 97.5 F (36.4 C)  98.3 F (36.8 C)   TempSrc: Oral  Oral   SpO2: 98%  Weight: 95.6 kg (210 lb 12.2 oz)  95.6 kg (210 lb 12.2 oz)   Height: 5\' 7"  (1.702 m)       Intake/Output Summary (Last 24 hours) at 09/06/2017 1235 Last data filed at 09/06/2017 0800 Gross per 24 hour  Intake 243 ml  Output 2900 ml  Net -2657 ml   Filed Weights   09/05/17 1757 09/05/17 2319 09/06/17 0521  Weight: 101.1 kg (222 lb 12.8 oz) 95.6 kg (210 lb 12.2 oz) 95.6 kg (210 lb 12.2 oz)     Examination:  General exam: Appears calm and comfortable  Respiratory system: Crackles b/l auscultation. Respiratory effort normal. Cardiovascular system: S1 & S2 heard, RRR. No JVD, murmurs, rubs, gallops or clicks. No pedal edema. Gastrointestinal system: Abdomen is nondistended, soft and nontender. No organomegaly or masses felt. Normal bowel sounds heard. Central nervous system: Alert and oriented. No focal neurological deficits. Extremities: Symmetric 5 x 5 power. Skin: No rashes, lesions or ulcers Psychiatry: Judgement and insight appear normal. Mood & affect appropriate.     Data Reviewed: I have personally reviewed following labs and imaging studies  CBC: Recent Labs  Lab 09/05/17 1739  WBC 7.0  NEUTROABS 3.2  HGB 13.7  HCT 40.8  MCV 81.1  PLT 231   Basic Metabolic Panel: Recent Labs  Lab 09/05/17 1739 09/06/17 0359  NA 138 138  K 3.5 3.5  CL 105 105  CO2 24 24  GLUCOSE 87 125*  BUN 17 16  CREATININE 0.96 1.00  CALCIUM 9.1 9.1   GFR: Estimated Creatinine Clearance: 85.5 mL/min (by C-G formula based on SCr of 1 mg/dL). Liver Function Tests: No results for input(s): AST, ALT, ALKPHOS, BILITOT, PROT, ALBUMIN in the last 168 hours. No results for input(s): LIPASE, AMYLASE in the last 168 hours. No results for input(s): AMMONIA in the last 168 hours. Coagulation Profile: No results for input(s): INR, PROTIME in the last 168 hours. Cardiac Enzymes: No results for input(s): CKTOTAL, CKMB, CKMBINDEX, TROPONINI in the last 168 hours. BNP (last 3 results) No results for input(s): PROBNP in the last 8760 hours. HbA1C: No results for input(s): HGBA1C in the last 72 hours. CBG: Recent Labs  Lab 09/05/17 2219 09/06/17 0741  GLUCAP 73 135*   Lipid Profile: No results for input(s): CHOL, HDL, LDLCALC, TRIG, CHOLHDL, LDLDIRECT in the last 72 hours. Thyroid Function Tests: No results for input(s): TSH, T4TOTAL, FREET4, T3FREE, THYROIDAB in the last 72  hours. Anemia Panel: No results for input(s): VITAMINB12, FOLATE, FERRITIN, TIBC, IRON, RETICCTPCT in the last 72 hours. Sepsis Labs: No results for input(s): PROCALCITON, LATICACIDVEN in the last 168 hours.  No results found for this or any previous visit (from the past 240 hour(s)).       Radiology Studies: Dg Chest 2 View  Result Date: 09/05/2017 CLINICAL DATA:  Increasing shortness of breath and dry cough. EXAM: CHEST  2 VIEW COMPARISON:  08/27/2017 FINDINGS: Enlarged cardiac silhouette. There is no evidence of focal airspace consolidation, pleural effusion or pneumothorax. Mild interstitial pulmonary edema appear Osseous structures are without acute abnormality. Soft tissues are grossly normal. IMPRESSION: Mild interstitial pulmonary edema with enlarged cardiac silhouette, which may represent cardiomegaly or pericardial effusion. Electronically Signed   By: Ted Mcalpine M.D.   On: 09/05/2017 15:10        Scheduled Meds: . aspirin EC  81 mg Oral Daily  . carvedilol  3.125 mg Oral BID WC  . cholecalciferol  2,000 Units Oral Daily  . diltiazem  30 mg Oral Q6H  . enoxaparin (LOVENOX) injection  40 mg Subcutaneous Q24H  . furosemide  40 mg Intravenous Q12H  . insulin aspart  0-5 Units Subcutaneous QHS  . insulin aspart  0-9 Units Subcutaneous TID WC  . mometasone-formoterol  2 puff Inhalation BID  . potassium chloride  20 mEq Oral BID  . sodium chloride flush  3 mL Intravenous Q12H   Continuous Infusions: . sodium chloride       LOS: 1 day       Alwyn Ren, MD [facility] If 7PM-7AM, please contact night-coverage [email or web address] Password [facility] 09/06/2017, 12:35 PM

## 2017-09-06 NOTE — Progress Notes (Signed)
Pt had 6 beat run of v tach. On call provider made aware. Will continue to monitor.

## 2017-09-06 NOTE — Progress Notes (Signed)
No changes from prior nurses assessment, will continue to monitor patient and F/U with plan care.

## 2017-09-07 DIAGNOSIS — I5023 Acute on chronic systolic (congestive) heart failure: Secondary | ICD-10-CM

## 2017-09-07 LAB — BASIC METABOLIC PANEL
Anion gap: 9 (ref 5–15)
BUN: 20 mg/dL (ref 6–20)
CO2: 24 mmol/L (ref 22–32)
Calcium: 9.6 mg/dL (ref 8.9–10.3)
Chloride: 103 mmol/L (ref 101–111)
Creatinine, Ser: 1.11 mg/dL (ref 0.61–1.24)
GFR calc Af Amer: >60 mL/min (ref >60)
GFR calc non Af Amer: >60 mL/min (ref >60)
Glucose, Bld: 141 mg/dL — ABNORMAL HIGH (ref 65–99)
Potassium: 4 mmol/L (ref 3.5–5.1)
Sodium: 136 mmol/L (ref 135–145)

## 2017-09-07 LAB — GLUCOSE, CAPILLARY
Glucose-Capillary: 138 mg/dL — ABNORMAL HIGH (ref 65–99)
Glucose-Capillary: 154 mg/dL — ABNORMAL HIGH (ref 65–99)
Glucose-Capillary: 149 mg/dL — ABNORMAL HIGH (ref 65–99)
Glucose-Capillary: 164 mg/dL — ABNORMAL HIGH (ref 65–99)

## 2017-09-07 LAB — MAGNESIUM: Magnesium: 2 mg/dL (ref 1.7–2.4)

## 2017-09-07 MED ORDER — LISINOPRIL 5 MG PO TABS: 2.5000 mg | ORAL_TABLET | Freq: Every day | ORAL | Status: DC

## 2017-09-07 MED ORDER — ATORVASTATIN CALCIUM 40 MG PO TABS
40.0000 mg | ORAL_TABLET | Freq: Every day | ORAL | Status: DC
  Administered 2017-09-07: 40 mg via ORAL
  Filled 2017-09-07: qty 1

## 2017-09-07 MED ORDER — FUROSEMIDE 10 MG/ML IJ SOLN
40.0000 mg | Freq: Every day | INTRAMUSCULAR | Status: DC
  Administered 2017-09-08: 40 mg via INTRAVENOUS
  Filled 2017-09-07: qty 4

## 2017-09-07 MED ORDER — SACUBITRIL-VALSARTAN 49-51 MG PO TABS
1.0000 | ORAL_TABLET | Freq: Two times a day (BID) | ORAL | Status: DC
  Administered 2017-09-07 – 2017-09-08 (×4): 1 via ORAL
  Filled 2017-09-07 (×5): qty 1

## 2017-09-07 MED ORDER — CLOPIDOGREL BISULFATE 75 MG PO TABS
75.0000 mg | ORAL_TABLET | Freq: Every day | ORAL | Status: DC
  Administered 2017-09-07 – 2017-09-08 (×2): 75 mg via ORAL
  Filled 2017-09-07 (×2): qty 1

## 2017-09-07 NOTE — Progress Notes (Signed)
PROGRESS NOTE    Todd Greer  ZOX:[id number] DOB: 03-May-1956 DOA: 09/05/2017 PCP: Ethelda Chick, MD   Brief Narrative: 62 y.o.malewith medical history significant fortype 2 diabetes mellitus, hypertension, hepatitis C status post treatment with Harvoni, and recent dyspnea and orthopnea that was attributed to CHF, now presenting to the emergency department with weight gain, worsening shortness of breath, and progressive orthopnea. Patient was seen in the emergency department for similar complaints approximately 10 days ago, noted to have elevated BNP, diagnosed with CHF, and started on Lasix. Despite taking the Lasix, his symptoms have continued to worsen and he reports a 3-5 pound weight gain over the past couple days. He has not had an echocardiogram. Denies chest pain. No fevers or chills.  ED Course:Upon arrival to the ED, patient is found to be afebrile, saturating mid 80s on room air and160/105, and with vitals otherwise normal. EKG features a sinus tachycardia with rate 100, frequent PVCs, LAFB, and nonspecific ST abnormalities. Chest x-ray features mild interstitial pulmonary edema and increased cardiac silhouette. Chemistry panel and CBC are unremarkable. Troponin is normal and BNP is elevated to 1021. The patient was treated with 40 mg IV Lasix and started on supplemental oxygen. He will be admitted to the telemetry unit for ongoing evaluation and management of hypoxia with shortness of breath and orthopnea, suspected secondary to acute CHF.  09/06/2017-patient feels a bit his breathing is better but still has dyspnea on exertion.  He does not have a primary care physician and he does have a cardiologist but he has not followed up with him.  Patient had 6 beat run of V. tach patient has a history of PVCs at home.  He does complain of a dry cough.   Assessment & Plan:   Principal Problem:   CHF, acute on chronic (HCC) Active Problems:   Essential hypertension   DM II  (diabetes mellitus, type II), controlled (HCC)   Acute respiratory failure with hypoxia (HCC)   1]Acute systolic congestive heart failure with severely depressed ejection fraction 15-20%/multiple PVCs 2] hypertension uncontrolled at home 3] type 2 diabetes diet controlled 4] history of alcohol abuse/tobacco abuse/polysubstance abuse including cocaine and heroine. 5] history of hepatitis C treated with Harvoni  Plan patient is seen by Dr. Sharyn Lull.  Appreciate his input.  Plan is to transfer him to call on tomorrow for possible catheterization on Tuesday.  Patient has been started on entresto, Lipitor and Plavix and Coreg.  I will add a lipid panel along with hemoglobin A1c.  This is patient's first episode of congestive heart failure.  His echocardiogram shows global hypokinesis with no wall motion abnormality possible dilated cardiomyopathy from alcohol question viral possibly drugs.  DVT prophylaxis: Lovenox Code Status: Full code Family Communication: No family available Disposition Plan: TBD   Consultants: Cardiology Dr. Sharyn Lull  Procedures none Antimicrobials: None Subjective: Feels better off of the oxygen sitting by the side of the bed and writing.  He reports his orthopnea has improved.  Objective: In no acute distress Vitals:   09/06/17 2136 09/07/17 0511 09/07/17 0834 09/07/17 0915  BP: 113/85 (!) 124/99 111/85   Pulse: (!) 45 89 62   Resp: 18 18    Temp: 98.4 F (36.9 C) 97.9 F (36.6 C)    TempSrc: Oral Oral    SpO2: 99% 100%  95%  Weight:  96 kg (211 lb 9.6 oz)    Height:        Intake/Output Summary (Last 24 hours) at 09/07/2017 1305  Last data filed at 09/07/2017 1030 Gross per 24 hour  Intake 360 ml  Output 1726 ml  Net -1366 ml   Filed Weights   09/05/17 2319 09/06/17 0521 09/07/17 0511  Weight: 95.6 kg (210 lb 12.2 oz) 95.6 kg (210 lb 12.2 oz) 96 kg (211 lb 9.6 oz)    Examination:  General exam: Appears calm and comfortable  Respiratory system:  Crackles bases auscultation. Respiratory effort normal. Cardiovascular system: S1 & S2 heard, RRR. No JVD, murmurs, rubs, gallops or clicks. No pedal edema. Gastrointestinal system: Abdomen is nondistended, soft and nontender. No organomegaly or masses felt. Normal bowel sounds heard. Central nervous system: Alert and oriented. No focal neurological deficits. Extremities: Symmetric 5 x 5 power. Skin: No rashes, lesions or ulcers Psychiatry: Judgement and insight appear normal. Mood & affect appropriate.     Data Reviewed: I have personally reviewed following labs and imaging studies  CBC: Recent Labs  Lab 09/05/17 1739  WBC 7.0  NEUTROABS 3.2  HGB 13.7  HCT 40.8  MCV 81.1  PLT 231   Basic Metabolic Panel: Recent Labs  Lab 09/05/17 1739 09/06/17 0359 09/07/17 0716  NA 138 138 136  K 3.5 3.5 4.0  CL 105 105 103  CO2 24 24 24   GLUCOSE 87 125* 141*  BUN 17 16 20   CREATININE 0.96 1.00 1.11  CALCIUM 9.1 9.1 9.6  MG  --   --  2.0   GFR: Estimated Creatinine Clearance: 77.2 mL/min (by C-G formula based on SCr of 1.11 mg/dL). Liver Function Tests: No results for input(s): AST, ALT, ALKPHOS, BILITOT, PROT, ALBUMIN in the last 168 hours. No results for input(s): LIPASE, AMYLASE in the last 168 hours. No results for input(s): AMMONIA in the last 168 hours. Coagulation Profile: No results for input(s): INR, PROTIME in the last 168 hours. Cardiac Enzymes: No results for input(s): CKTOTAL, CKMB, CKMBINDEX, TROPONINI in the last 168 hours. BNP (last 3 results) No results for input(s): PROBNP in the last 8760 hours. HbA1C: No results for input(s): HGBA1C in the last 72 hours. CBG: Recent Labs  Lab 09/06/17 1246 09/06/17 1621 09/06/17 2135 09/07/17 0739 09/07/17 1147  GLUCAP 118* 175* 144* 138* 154*   Lipid Profile: No results for input(s): CHOL, HDL, LDLCALC, TRIG, CHOLHDL, LDLDIRECT in the last 72 hours. Thyroid Function Tests: No results for input(s): TSH, T4TOTAL,  FREET4, T3FREE, THYROIDAB in the last 72 hours. Anemia Panel: No results for input(s): VITAMINB12, FOLATE, FERRITIN, TIBC, IRON, RETICCTPCT in the last 72 hours. Sepsis Labs: No results for input(s): PROCALCITON, LATICACIDVEN in the last 168 hours.  No results found for this or any previous visit (from the past 240 hour(s)).       Radiology Studies: Dg Chest 2 View  Result Date: 09/05/2017 CLINICAL DATA:  Increasing shortness of breath and dry cough. EXAM: CHEST  2 VIEW COMPARISON:  08/27/2017 FINDINGS: Enlarged cardiac silhouette. There is no evidence of focal airspace consolidation, pleural effusion or pneumothorax. Mild interstitial pulmonary edema appear Osseous structures are without acute abnormality. Soft tissues are grossly normal. IMPRESSION: Mild interstitial pulmonary edema with enlarged cardiac silhouette, which may represent cardiomegaly or pericardial effusion. Electronically Signed   By: Ted Mcalpine M.D.   On: 09/05/2017 15:10        Scheduled Meds: . aspirin EC  81 mg Oral Daily  . atorvastatin  40 mg Oral q1800  . carvedilol  6.25 mg Oral BID WC  . cholecalciferol  2,000 Units Oral Daily  . clopidogrel  75 mg Oral Daily  . enoxaparin (LOVENOX) injection  40 mg Subcutaneous Q24H  . [START ON 09/08/2017] furosemide  40 mg Intravenous Daily  . insulin aspart  0-5 Units Subcutaneous QHS  . insulin aspart  0-9 Units Subcutaneous TID WC  . mometasone-formoterol  2 puff Inhalation BID  . potassium chloride  20 mEq Oral BID  . sacubitril-valsartan  1 tablet Oral BID  . sodium chloride flush  3 mL Intravenous Q12H   Continuous Infusions: . sodium chloride       LOS: 2 days     Alwyn Ren, MD [facility]  If 7PM-7AM, please contact night-coverage [email or web address] Password [email or web address] 09/07/2017, 1:05 PM

## 2017-09-07 NOTE — Consult Note (Signed)
Reason for Consult: Acute decompensated systolic congestive heart failure with severely depressed LV systolic function Referring Physician: [facility]  Todd Greer is an 62 y.o. male.  HPI: Patient is 62 year old male with past medical history significant for hypertension, type 2 diabetes mellitus, morbid obesity, history of polysubstance abuse i.e. heroine and IV cocaine for 20+ years quit approximately 15 years ago, history of EtOH abuse, remote tobacco abuse, history of hepatitis C in the past treated with Harvoni , was admitted on 09/05/2017 because of progressive increasing shortness of breath associated with abdominal swelling and PND orthopnea for last 1 month. States feels start fatigue no energy. Patient denies any chest pain at present but states had chest pressure a few weeks ago radiating to his throat did not seek any medical attention. Patient was noted to have elevated BNP and received IV Lasix with improvement in his symptoms. EKG showed sinus tachycardia with ventricular trigeminy and poor R-wave progression in V1 to V4 which is new as compared to prior EKGs from 2014. 2-D echo done yesterday showed markedly depressed LV systolic function with EF of 15-20%. Patient had the nuclear stress test in 2014 which showed no evidence of ischemia. Patient has seen [facility] in 2014 and wish to switch. Patient had occasional PVCs bigeminy trigeminy and couplets on the monitor asymptomatic.  Past Medical History:  Diagnosis Date  . Anxiety   . Diabetes mellitus type II, controlled, with no complications ([place])   . Hepatitis C, chronic ([place])   . Hypertension   . Obesity, Class II, BMI 35-39.9, with comorbidity   . Obesity, diabetes, and hypertension syndrome ([place])   . Substance abuse [facility])     Past Surgical History:  Procedure Laterality Date  . NM  EXERCISE MYOVIEW LTD  06-2013   No ischemia or infarction; note dated images    Family History  Problem Relation  Age of Onset  . Stroke Father   . Diabetes Father   . Hypertension Father   . Cancer Sister   . Cerebral aneurysm Mother        Died at a young age.  . Diabetes Paternal Grandmother   . Hypertension Paternal Grandmother   . Stroke Paternal Grandmother     Social History:  reports that he quit smoking about 33 years ago. His smoking use included cigarettes. He has a 5.00 pack-year smoking history. he has never used smokeless tobacco. He reports that he drinks about 3.0 oz of alcohol per week. He reports that he does not use drugs.  Allergies:  Allergies  Allergen Reactions  . Ace Inhibitors Cough    Tolerates ARB  . Amlodipine Other (See Comments)    Fatigue    Medications: I have reviewed the patient's current medications.  Results for orders placed or performed during the hospital encounter of 09/05/17 (from the past 48 hour(s))  CBC with Differential     Status: None   Collection Time: 09/05/17  5:39 PM  Result Value Ref Range   WBC 7.0 4.0 - 10.5 K/uL   RBC 5.03 4.22 - 5.81 MIL/uL   Hemoglobin 13.7 13.0 - 17.0 g/dL   HCT 40.8 39.0 - 52.0 %   MCV 81.1 78.0 - 100.0 fL   MCH 27.2 26.0 - 34.0 pg   MCHC 33.6 30.0 - 36.0 g/dL   RDW 13.7 11.5 - 15.5 %   Platelets 231 150 - 400 K/uL   Neutrophils Relative % 47 %   Neutro Abs 3.2 1.7 -  7.7 K/uL   Lymphocytes Relative 40 %   Lymphs Abs 2.8 0.7 - 4.0 K/uL   Monocytes Relative 11 %   Monocytes Absolute 0.8 0.1 - 1.0 K/uL   Eosinophils Relative 1 %   Eosinophils Absolute 0.1 0.0 - 0.7 K/uL   Basophils Relative 1 %   Basophils Absolute 0.0 0.0 - 0.1 K/uL  Basic metabolic panel     Status: None   Collection Time: 09/05/17  5:39 PM  Result Value Ref Range   Sodium 138 135 - 145 mmol/L   Potassium 3.5 3.5 - 5.1 mmol/L   Chloride 105 101 - 111 mmol/L   CO2 24 22 - 32 mmol/L   Glucose, Bld 87 65 - 99 mg/dL   BUN 17 6 - 20 mg/dL   Creatinine, Ser 0.96 0.61 - 1.24 mg/dL   Calcium 9.1 8.9 - 10.3 mg/dL   GFR calc non Af Amer  >60 >60 mL/min   GFR calc Af Amer >60 >60 mL/min    Comment: (NOTE) The eGFR has been calculated using the CKD EPI equation. This calculation has not been validated in all clinical situations. eGFR's persistently <60 mL/min signify possible Chronic Kidney Disease.    Anion gap 9 5 - 15  Brain natriuretic peptide     Status: Abnormal   Collection Time: 09/05/17  5:39 PM  Result Value Ref Range   B Natriuretic Peptide 1,021.1 (H) 0.0 - 100.0 pg/mL  I-stat troponin, ED     Status: None   Collection Time: 09/05/17  5:52 PM  Result Value Ref Range   Troponin i, poc 0.03 0.00 - 0.08 ng/mL   Comment 3            Comment: Due to the release kinetics of cTnI, a negative result within the first hours of the onset of symptoms does not rule out myocardial infarction with certainty. If myocardial infarction is still suspected, repeat the test at appropriate intervals.   CBG monitoring, ED     Status: None   Collection Time: 09/05/17 10:19 PM  Result Value Ref Range   Glucose-Capillary 73 65 - 99 mg/dL  HIV antibody (Routine Testing)     Status: None   Collection Time: 09/06/17  3:59 AM  Result Value Ref Range   HIV Screen 4th Generation wRfx Non Reactive Non Reactive    Comment: (NOTE) Performed At: [facility] [place], [place] [id number] Rush Farmer MD UE:[id number]   Basic metabolic panel     Status: Abnormal   Collection Time: 09/06/17  3:59 AM  Result Value Ref Range   Sodium 138 135 - 145 mmol/L   Potassium 3.5 3.5 - 5.1 mmol/L   Chloride 105 101 - 111 mmol/L   CO2 24 22 - 32 mmol/L   Glucose, Bld 125 (H) 65 - 99 mg/dL   BUN 16 6 - 20 mg/dL   Creatinine, Ser 1.00 0.61 - 1.24 mg/dL   Calcium 9.1 8.9 - 10.3 mg/dL   GFR calc non Af Amer >60 >60 mL/min   GFR calc Af Amer >60 >60 mL/min    Comment: (NOTE) The eGFR has been calculated using the CKD EPI equation. This calculation has not been validated in all clinical situations. eGFR's persistently  <60 mL/min signify possible Chronic Kidney Disease.    Anion gap 9 5 - 15  Glucose, capillary     Status: Abnormal   Collection Time: 09/06/17  7:41 AM  Result Value Ref Range  Glucose-Capillary 135 (H) 65 - 99 mg/dL  Glucose, capillary     Status: Abnormal   Collection Time: 09/06/17 12:46 PM  Result Value Ref Range   Glucose-Capillary 118 (H) 65 - 99 mg/dL  Glucose, capillary     Status: Abnormal   Collection Time: 09/06/17  4:21 PM  Result Value Ref Range   Glucose-Capillary 175 (H) 65 - 99 mg/dL  Glucose, capillary     Status: Abnormal   Collection Time: 09/06/17  9:35 PM  Result Value Ref Range   Glucose-Capillary 144 (H) 65 - 99 mg/dL  Basic metabolic panel     Status: Abnormal   Collection Time: 09/07/17  7:16 AM  Result Value Ref Range   Sodium 136 135 - 145 mmol/L   Potassium 4.0 3.5 - 5.1 mmol/L   Chloride 103 101 - 111 mmol/L   CO2 24 22 - 32 mmol/L   Glucose, Bld 141 (H) 65 - 99 mg/dL   BUN 20 6 - 20 mg/dL   Creatinine, Ser 1.11 0.61 - 1.24 mg/dL   Calcium 9.6 8.9 - 10.3 mg/dL   GFR calc non Af Amer >60 >60 mL/min   GFR calc Af Amer >60 >60 mL/min    Comment: (NOTE) The eGFR has been calculated using the CKD EPI equation. This calculation has not been validated in all clinical situations. eGFR's persistently <60 mL/min signify possible Chronic Kidney Disease.    Anion gap 9 5 - 15  Magnesium     Status: None   Collection Time: 09/07/17  7:16 AM  Result Value Ref Range   Magnesium 2.0 1.7 - 2.4 mg/dL  Glucose, capillary     Status: Abnormal   Collection Time: 09/07/17  7:39 AM  Result Value Ref Range   Glucose-Capillary 138 (H) 65 - 99 mg/dL    Dg Chest 2 View  Result Date: 09/05/2017 CLINICAL DATA:  Increasing shortness of breath and dry cough. EXAM: CHEST  2 VIEW COMPARISON:  08/27/2017 FINDINGS: Enlarged cardiac silhouette. There is no evidence of focal airspace consolidation, pleural effusion or pneumothorax. Mild interstitial pulmonary edema appear  Osseous structures are without acute abnormality. Soft tissues are grossly normal. IMPRESSION: Mild interstitial pulmonary edema with enlarged cardiac silhouette, which may represent cardiomegaly or pericardial effusion. Electronically Signed   By: Fidela Salisbury M.D.   On: 09/05/2017 15:10    Review of Systems  Constitutional: Positive for malaise/fatigue. Negative for chills and fever.  HENT: Negative for hearing loss.   Eyes: Negative for blurred vision.  Respiratory: Positive for cough and shortness of breath.   Cardiovascular: Positive for orthopnea and PND. Negative for chest pain.  Gastrointestinal: Negative for abdominal pain, nausea and vomiting.  Genitourinary: Negative for dysuria.  Neurological: Positive for weakness. Negative for dizziness.   Blood pressure 111/85, pulse 62, temperature 97.9 F (36.6 C), temperature source Oral, resp. rate 18, height _0  (1.702 m), weight 96 kg (211 lb 9.6 oz), SpO2 95 %. Physical Exam  Constitutional: He is oriented to person, place, and time.  HENT:  Head: Normocephalic and atraumatic.  Eyes: Conjunctivae are normal. Pupils are equal, round, and reactive to light. Left eye exhibits no discharge. No scleral icterus.  Neck: Normal range of motion. Neck supple. No JVD present. No tracheal deviation present.  Cardiovascular: Normal rate and regular rhythm.  Murmur (Soft systolic murmur and S3 gallop noted) heard. Respiratory:  Decreased breath sound at bases with faint rales  GI: Soft. Bowel sounds are normal. He exhibits no  distension. There is no tenderness. There is no rebound.  Musculoskeletal: He exhibits no edema, tenderness or deformity.  Neurological: He is alert and oriented to person, place, and time.    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 Start Plavix and Lipitor as per orders Discussed with patient regarding left cardiac catheterization possible PTCA stenting its risk and benefits i.e. death MI stroke need for emergency CABG risk of restenosis local vascular complications etc. and consents for PCI Will schedule him for Tuesday once fully compensated . Charolette Forward 09/07/2017, 10:42 AM

## 2017-09-08 LAB — CBC
HCT: 47.4 % (ref 39.0–52.0)
Hemoglobin: 16 g/dL (ref 13.0–17.0)
MCH: 27.7 pg (ref 26.0–34.0)
MCHC: 33.8 g/dL (ref 30.0–36.0)
MCV: 82.1 fL (ref 78.0–100.0)
Platelets: 262 10*3/uL (ref 150–400)
RBC: 5.77 MIL/uL (ref 4.22–5.81)
RDW: 14 % (ref 11.5–15.5)
WBC: 7.3 10*3/uL (ref 4.0–10.5)

## 2017-09-08 LAB — HEMOGLOBIN A1C
Hgb A1c MFr Bld: 6.9 % — ABNORMAL HIGH (ref 4.8–5.6)
Mean Plasma Glucose: 151.33 mg/dL

## 2017-09-08 LAB — GLUCOSE, CAPILLARY
Glucose-Capillary: 171 mg/dL — ABNORMAL HIGH (ref 65–99)
Glucose-Capillary: 161 mg/dL — ABNORMAL HIGH (ref 65–99)
Glucose-Capillary: 159 mg/dL — ABNORMAL HIGH (ref 65–99)
Glucose-Capillary: 150 mg/dL — ABNORMAL HIGH (ref 65–99)

## 2017-09-08 LAB — BASIC METABOLIC PANEL
Anion gap: 7 (ref 5–15)
BUN: 18 mg/dL (ref 6–20)
CO2: 21 mmol/L — ABNORMAL LOW (ref 22–32)
Calcium: 9.2 mg/dL (ref 8.9–10.3)
Chloride: 107 mmol/L (ref 101–111)
Creatinine, Ser: 0.91 mg/dL (ref 0.61–1.24)
GFR calc Af Amer: >60 mL/min (ref >60)
GFR calc non Af Amer: >60 mL/min (ref >60)
Glucose, Bld: 148 mg/dL — ABNORMAL HIGH (ref 65–99)
Potassium: 4 mmol/L (ref 3.5–5.1)
Sodium: 135 mmol/L (ref 135–145)

## 2017-09-08 LAB — LIPID PANEL
Cholesterol: 205 mg/dL — ABNORMAL HIGH (ref 0–200)
HDL: 54 mg/dL (ref >40)
LDL Cholesterol: 135 mg/dL — ABNORMAL HIGH (ref 0–99)
Total CHOL/HDL Ratio: 3.8 RATIO
Triglycerides: 81 mg/dL (ref <150)
VLDL: 16 mg/dL (ref 0–40)

## 2017-09-08 MED ORDER — SODIUM CHLORIDE 0.9 % IV SOLN
INTRAVENOUS | Status: DC
  Administered 2017-09-09: 06:00:00 via INTRAVENOUS

## 2017-09-08 MED ORDER — SODIUM CHLORIDE 0.9% FLUSH
3.0000 mL | Freq: Two times a day (BID) | INTRAVENOUS | Status: DC
  Administered 2017-09-08: 3 mL via INTRAVENOUS

## 2017-09-08 MED ORDER — ATORVASTATIN CALCIUM 20 MG PO TABS
20.0000 mg | ORAL_TABLET | Freq: Every day | ORAL | Status: DC
  Administered 2017-09-08: 20 mg via ORAL
  Filled 2017-09-08: qty 1

## 2017-09-08 MED ORDER — SODIUM CHLORIDE 0.9% FLUSH: 3.0000 mL | INTRAVENOUS | Status: DC | PRN

## 2017-09-08 MED ORDER — ASPIRIN 81 MG PO CHEW
81.0000 mg | CHEWABLE_TABLET | ORAL | Status: AC
  Administered 2017-09-09: 81 mg via ORAL
  Filled 2017-09-08: qty 1

## 2017-09-08 MED ORDER — SODIUM CHLORIDE 0.9 % IV SOLN: 250.0000 mL | INTRAVENOUS | Status: DC | PRN

## 2017-09-08 NOTE — H&P (View-Only) (Signed)
Subjective:  Patient denies any chest pain or shortness of breaths.  Objective:  Vital Signs in the last 24 hours: Temp:  [97.7 F (36.5 C)-98.5 F (36.9 C)] 98.3 F (36.8 C) (01/07 1300) Pulse Rate:  [50-97] 50 (01/07 1300) Resp:  [14-20] 14 (01/07 1300) BP: (112-148)/(80-99) 125/90 (01/07 1300) SpO2:  [91 %-100 %] 99 % (01/07 1300) Weight:  [95.7 kg (210 lb 14.4 oz)] 95.7 kg (210 lb 14.4 oz) (01/07 0535)  Intake/Output from previous day: 01/06 0701 - 01/07 0700 In: 120 [P.O.:120] Out: 925 [Urine:925] Intake/Output from this shift: Total I/O In: 240 [P.O.:240] Out: 300 [Urine:300]  Physical Exam: Neck: no adenopathy, no carotid bruit, no JVD and supple, symmetrical, trachea midline Lungs: clear to auscultation bilaterally Heart: regular rate and rhythm, S1, S2 normal and Soft systolic murmur noted Abdomen: soft, non-tender; bowel sounds normal; no masses,  no organomegaly Extremities: extremities normal, atraumatic, no cyanosis or edema  Lab Results: Recent Labs    09/05/17 1739 09/08/17 0437  WBC 7.0 7.3  HGB 13.7 16.0  PLT 231 262   Recent Labs    09/07/17 0716 09/08/17 0437  NA 136 135  K 4.0 4.0  CL 103 107  CO2 24 21*  GLUCOSE 141* 148*  BUN 20 18  CREATININE 1.11 0.91   No results for input(s): TROPONINI in the last 72 hours.  Invalid input(s): CK, MB Hepatic Function Panel No results for input(s): PROT, ALBUMIN, AST, ALT, ALKPHOS, BILITOT, BILIDIR, IBILI in the last 72 hours. Recent Labs    09/08/17 0437  CHOL 205*   No results for input(s): PROTIME in the last 72 hours.  Imaging: Imaging results have been reviewed and No results found.  Cardiac Studies:  Assessment/Plan:  Resolving acute systolic congestive heart failure rule out ischemia Status post acute hypoxic respiratory failure secondary to above Possible silent anteroseptal wall MI in the past Dilated cardiomyopathy Hypertensive heart disease with systolic dysfunction Type 2  diabetes mellitus Morbid obesity History of polysubstance abuse History of EtOH abuse History of remote tobacco abuse History of hepatitis C treated with Harvoni in the past Plan Discussed with patient at length regarding left cardiac catheterization, possible PTCA stenting.  it'srisk and benefits, I.e., death, MI, stroke, need for emergency CABG, local vascular complications, risk of restenosis, etc. And consents for PCI Patient scheduled for transferred to [facility] later this afternoon   LOS: 3 days    Rinaldo Cloud 09/08/2017, 1:34 PM

## 2017-09-08 NOTE — Progress Notes (Addendum)
PROGRESS NOTE    Todd Greer  WNU:[id number] DOB: October 22, 1955 DOA: 09/05/2017 PCP: Ethelda Chick, MD  Brief Narrative: 62 y.o.malewith medical history significant fortype 2 diabetes mellitus, hypertension, hepatitis C status post treatment with Harvoni, and recent dyspnea and orthopnea that was attributed to CHF, now presenting to the emergency department with weight gain, worsening shortness of breath, and progressive orthopnea. Patient was seen in the emergency department for similar complaints approximately 10 days ago, noted to have elevated BNP, diagnosed with CHF, and started on Lasix. Despite taking the Lasix, his symptoms have continued to worsen and he reports a 3-5 pound weight gain over the past couple days. He has not had an echocardiogram. Denies chest pain. No fevers or chills.  ED Course:Upon arrival to the ED, patient is found to be afebrile, saturating mid 80s on room air and160/105, and with vitals otherwise normal. EKG features a sinus tachycardia with rate 100, frequent PVCs, LAFB, and nonspecific ST abnormalities. Chest x-ray features mild interstitial pulmonary edema and increased cardiac silhouette. Chemistry panel and CBC are unremarkable. Troponin is normal and BNP is elevated to 1021. The patient was treated with 40 mg IV Lasix and started on supplemental oxygen. He will be admitted to the telemetry unit for ongoing evaluation and management of hypoxia with shortness of breath and orthopnea, suspected secondary to acute CHF.  09/06/2017-patient feels a bit his breathing is better but still has dyspnea on exertion. He does not have a primary care physician and he does have a cardiologist but he has not followed up with him. Patient had 6 beat run of V. tach patient has a history of PVCs at home. He does complain of a dry cough.   Assessment & Plan:   Principal Problem:   CHF, acute on chronic (HCC) Active Problems:   Essential hypertension   DM II  (diabetes mellitus, type II), controlled ([facility])   Acute respiratory failure with hypoxia ([facility])  1]Acute systolic congestive heart failure with severely depressed ejection fraction new onset  15-20%/multiple PVCs 2] hypertension uncontrolled at home 3] type 2 diabetes diet controlled with diet hba1c 6.9 4] history of alcohol abuse/tobacco abuse/polysubstance abuse including cocaine and heroine. 5] history of hepatitis C treated with Harvoni 6]hyperlipedemia-LDL 135-statins started.   Plan is for patient to be transferred [facility] today for cath tomorrow by dr Sharyn Lull.continue entresto.lipitor started.start plavix when ok with cardiology.    DVT prophylaxis: lovenox Code Status:full Family Communication: none Disposition Plan:transfer to [facility] for cath. Consultants: cards  Procedures:none Antimicrobials: none Subjective: Feels better  Objective: Vitals:   09/07/17 1739 09/07/17 2037 09/07/17 2109 09/08/17 0535  BP: (!) 148/99  117/80 112/82  Pulse: 97  94 60  Resp: 20  18 18   Temp: 97.7 F (36.5 C)  98.5 F (36.9 C) 98.2 F (36.8 C)  TempSrc: Axillary  Oral Oral  SpO2: 100% 96% 91% 98%  Weight:    95.7 kg (210 lb 14.4 oz)  Height:        Intake/Output Summary (Last 24 hours) at 09/08/2017 1117 Last data filed at 09/08/2017 0939 Gross per 24 hour  Intake 240 ml  Output 825 ml  Net -585 ml   Filed Weights   09/06/17 0521 09/07/17 0511 09/08/17 0535  Weight: 95.6 kg (210 lb 12.2 oz) 96 kg (211 lb 9.6 oz) 95.7 kg (210 lb 14.4 oz)    Examination:  General exam: Appears calm and comfortable  Respiratory system: decresed breath sounds bases auscultation. Respiratory effort normal.  Cardiovascular system: S1 & S2 heard, RRR. No JVD, murmurs, rubs, gallops or clicks. No pedal edema. Gastrointestinal system: Abdomen is nondistended, soft and nontender. No organomegaly or masses felt. Normal bowel sounds heard. Central nervous system: Alert and oriented. No focal neurological  deficits. Extremities: Symmetric 5 x 5 power. Skin: No rashes, lesions or ulcers Psychiatry: Judgement and insight appear normal. Mood & affect appropriate.     Data Reviewed: I have personally reviewed following labs and imaging studies  CBC: Recent Labs  Lab 09/05/17 1739 09/08/17 0437  WBC 7.0 7.3  NEUTROABS 3.2  --   HGB 13.7 16.0  HCT 40.8 47.4  MCV 81.1 82.1  PLT 231 262   Basic Metabolic Panel: Recent Labs  Lab 09/05/17 1739 09/06/17 0359 09/07/17 0716 09/08/17 0437  NA 138 138 136 135  K 3.5 3.5 4.0 4.0  CL 105 105 103 107  CO2 24 24 24  21*  GLUCOSE 87 125* 141* 148*  BUN 17 16 20 18   CREATININE 0.96 1.00 1.11 0.91  CALCIUM 9.1 9.1 9.6 9.2  MG  --   --  2.0  --    GFR: Estimated Creatinine Clearance: 93.9 mL/min (by C-G formula based on SCr of 0.91 mg/dL). Liver Function Tests: No results for input(s): AST, ALT, ALKPHOS, BILITOT, PROT, ALBUMIN in the last 168 hours. No results for input(s): LIPASE, AMYLASE in the last 168 hours. No results for input(s): AMMONIA in the last 168 hours. Coagulation Profile: No results for input(s): INR, PROTIME in the last 168 hours. Cardiac Enzymes: No results for input(s): CKTOTAL, CKMB, CKMBINDEX, TROPONINI in the last 168 hours. BNP (last 3 results) No results for input(s): PROBNP in the last 8760 hours. HbA1C: Recent Labs    09/08/17 0437  HGBA1C 6.9*   CBG: Recent Labs  Lab 09/07/17 0739 09/07/17 1147 09/07/17 1644 09/07/17 2106 09/08/17 0753  GLUCAP 138* 154* 149* 164* 171*   Lipid Profile: Recent Labs    09/08/17 0437  CHOL 205*  HDL 54  LDLCALC 135*  TRIG 81  CHOLHDL 3.8   Thyroid Function Tests: No results for input(s): TSH, T4TOTAL, FREET4, T3FREE, THYROIDAB in the last 72 hours. Anemia Panel: No results for input(s): VITAMINB12, FOLATE, FERRITIN, TIBC, IRON, RETICCTPCT in the last 72 hours. Sepsis Labs: No results for input(s): PROCALCITON, LATICACIDVEN in the last 168 hours.  No  results found for this or any previous visit (from the past 240 hour(s)).       Radiology Studies: No results found.      Scheduled Meds: . aspirin EC  81 mg Oral Daily  . atorvastatin  40 mg Oral q1800  . carvedilol  6.25 mg Oral BID WC  . cholecalciferol  2,000 Units Oral Daily  . clopidogrel  75 mg Oral Daily  . enoxaparin (LOVENOX) injection  40 mg Subcutaneous Q24H  . furosemide  40 mg Intravenous Daily  . insulin aspart  0-5 Units Subcutaneous QHS  . insulin aspart  0-9 Units Subcutaneous TID WC  . mometasone-formoterol  2 puff Inhalation BID  . potassium chloride  20 mEq Oral BID  . sacubitril-valsartan  1 tablet Oral BID  . sodium chloride flush  3 mL Intravenous Q12H   Continuous Infusions: . sodium chloride       LOS: 3 days    Alwyn Ren, MD [facility]  If 7PM-7AM, please contact night-coverage [email or web address] Password [email or web address] 09/08/2017, 11:17 AM

## 2017-09-08 NOTE — Progress Notes (Signed)
Subjective:  Patient denies any chest pain or shortness of breaths.  Objective:  Vital Signs in the last 24 hours: Temp:  [97.7 F (36.5 C)-98.5 F (36.9 C)] 98.3 F (36.8 C) (01/07 1300) Pulse Rate:  [50-97] 50 (01/07 1300) Resp:  [14-20] 14 (01/07 1300) BP: (112-148)/(80-99) 125/90 (01/07 1300) SpO2:  [91 %-100 %] 99 % (01/07 1300) Weight:  [95.7 kg (210 lb 14.4 oz)] 95.7 kg (210 lb 14.4 oz) (01/07 0535)  Intake/Output from previous day: 01/06 0701 - 01/07 0700 In: 120 [P.O.:120] Out: 925 [Urine:925] Intake/Output from this shift: Total I/O In: 240 [P.O.:240] Out: 300 [Urine:300]  Physical Exam: Neck: no adenopathy, no carotid bruit, no JVD and supple, symmetrical, trachea midline Lungs: clear to auscultation bilaterally Heart: regular rate and rhythm, S1, S2 normal and Soft systolic murmur noted Abdomen: soft, non-tender; bowel sounds normal; no masses,  no organomegaly Extremities: extremities normal, atraumatic, no cyanosis or edema  Lab Results: Recent Labs    09/05/17 1739 09/08/17 0437  WBC 7.0 7.3  HGB 13.7 16.0  PLT 231 262   Recent Labs    09/07/17 0716 09/08/17 0437  NA 136 135  K 4.0 4.0  CL 103 107  CO2 24 21*  GLUCOSE 141* 148*  BUN 20 18  CREATININE 1.11 0.91   No results for input(s): TROPONINI in the last 72 hours.  Invalid input(s): CK, MB Hepatic Function Panel No results for input(s): PROT, ALBUMIN, AST, ALT, ALKPHOS, BILITOT, BILIDIR, IBILI in the last 72 hours. Recent Labs    09/08/17 0437  CHOL 205*   No results for input(s): PROTIME in the last 72 hours.  Imaging: Imaging results have been reviewed and No results found.  Cardiac Studies:  Assessment/Plan:  Resolving acute systolic congestive heart failure rule out ischemia Status post acute hypoxic respiratory failure secondary to above Possible silent anteroseptal wall MI in the past Dilated cardiomyopathy Hypertensive heart disease with systolic dysfunction Type 2  diabetes mellitus Morbid obesity History of polysubstance abuse History of EtOH abuse History of remote tobacco abuse History of hepatitis C treated with Harvoni in the past Plan Discussed with patient at length regarding left cardiac catheterization, possible PTCA stenting.  it'srisk and benefits, I.e., death, MI, stroke, need for emergency CABG, local vascular complications, risk of restenosis, etc. And consents for PCI Patient scheduled for transferred to [facility] later this afternoon   LOS: 3 days    Todd Greer 09/08/2017, 1:34 PM   

## 2017-09-09 ENCOUNTER — Encounter (HOSPITAL_COMMUNITY)
Admission: EM | Disposition: A | Discharge status: Home / Self Care | Payer: Self-pay | Source: Home / Self Care | Attending: Internal Medicine

## 2017-09-09 ENCOUNTER — Encounter (HOSPITAL_COMMUNITY): Payer: Self-pay | Admitting: Cardiology

## 2017-09-09 HISTORY — PX: LEFT HEART CATH AND CORONARY ANGIOGRAPHY: CATH118249

## 2017-09-09 LAB — BASIC METABOLIC PANEL
Anion gap: 6 (ref 5–15)
BUN: 21 mg/dL — ABNORMAL HIGH (ref 6–20)
CO2: 23 mmol/L (ref 22–32)
Calcium: 8.9 mg/dL (ref 8.9–10.3)
Chloride: 107 mmol/L (ref 101–111)
Creatinine, Ser: 0.92 mg/dL (ref 0.61–1.24)
GFR calc Af Amer: >60 mL/min (ref >60)
GFR calc non Af Amer: >60 mL/min (ref >60)
Glucose, Bld: 154 mg/dL — ABNORMAL HIGH (ref 65–99)
Potassium: 4.1 mmol/L (ref 3.5–5.1)
Sodium: 136 mmol/L (ref 135–145)

## 2017-09-09 LAB — GLUCOSE, CAPILLARY: Glucose-Capillary: 142 mg/dL — ABNORMAL HIGH (ref 65–99)

## 2017-09-09 LAB — PROTIME-INR
INR: 1.07
Prothrombin Time: 13.8 seconds (ref 11.4–15.2)

## 2017-09-09 LAB — CARDIAC CATHETERIZATION: Cath EF Quantitative: 20 %

## 2017-09-09 SURGERY — LEFT HEART CATH AND CORONARY ANGIOGRAPHY
Anesthesia: LOCAL

## 2017-09-09 MED ORDER — CLOPIDOGREL BISULFATE 75 MG PO TABS: 75.0000 mg | ORAL_TABLET | Freq: Every day | ORAL | 0 refills | Status: DC

## 2017-09-09 MED ORDER — POTASSIUM CHLORIDE CRYS ER 20 MEQ PO TBCR: 20.0000 meq | EXTENDED_RELEASE_TABLET | Freq: Every day | ORAL | 0 refills | Status: DC

## 2017-09-09 MED ORDER — LIDOCAINE HCL (PF) 1 % IJ SOLN
INTRAMUSCULAR | Status: DC | PRN
  Administered 2017-09-09: 17 mL via INTRADERMAL

## 2017-09-09 MED ORDER — MIDAZOLAM HCL 2 MG/2ML IJ SOLN
INTRAMUSCULAR | Status: DC | PRN
  Administered 2017-09-09: 1 mg via INTRAVENOUS

## 2017-09-09 MED ORDER — FENTANYL CITRATE (PF) 100 MCG/2ML IJ SOLN
INTRAMUSCULAR | Status: DC | PRN
  Administered 2017-09-09: 25 ug via INTRAVENOUS

## 2017-09-09 MED ORDER — IOPAMIDOL (ISOVUE-370) INJECTION 76%
INTRAVENOUS | Status: AC
  Filled 2017-09-09: qty 100

## 2017-09-09 MED ORDER — SODIUM CHLORIDE 0.9% FLUSH: 3.0000 mL | Freq: Two times a day (BID) | INTRAVENOUS | Status: DC

## 2017-09-09 MED ORDER — FENTANYL CITRATE (PF) 100 MCG/2ML IJ SOLN
INTRAMUSCULAR | Status: AC
  Filled 2017-09-09: qty 2

## 2017-09-09 MED ORDER — LIDOCAINE HCL (PF) 1 % IJ SOLN
INTRAMUSCULAR | Status: AC
  Filled 2017-09-09: qty 30

## 2017-09-09 MED ORDER — SACUBITRIL-VALSARTAN 49-51 MG PO TABS: 1.0000 | ORAL_TABLET | Freq: Two times a day (BID) | ORAL | 0 refills | Status: DC

## 2017-09-09 MED ORDER — SPIRONOLACTONE 12.5 MG HALF TABLET: 12.5000 mg | ORAL_TABLET | Freq: Every day | ORAL | Status: DC

## 2017-09-09 MED ORDER — SODIUM CHLORIDE 0.9 % IV SOLN: 250.0000 mL | INTRAVENOUS | Status: DC | PRN

## 2017-09-09 MED ORDER — ATORVASTATIN CALCIUM 20 MG PO TABS: 20.0000 mg | ORAL_TABLET | Freq: Every day | ORAL | 0 refills | Status: DC

## 2017-09-09 MED ORDER — IOPAMIDOL (ISOVUE-370) INJECTION 76%
INTRAVENOUS | Status: DC | PRN
  Administered 2017-09-09: 50 mL via INTRA_ARTERIAL

## 2017-09-09 MED ORDER — SPIRONOLACTONE 25 MG PO TABS: 12.5000 mg | ORAL_TABLET | Freq: Every day | ORAL | 0 refills | Status: DC

## 2017-09-09 MED ORDER — MIDAZOLAM HCL 2 MG/2ML IJ SOLN
INTRAMUSCULAR | Status: AC
  Filled 2017-09-09: qty 2

## 2017-09-09 MED ORDER — HEPARIN (PORCINE) IN NACL 2-0.9 UNIT/ML-% IJ SOLN
INTRAMUSCULAR | Status: AC | PRN
  Administered 2017-09-09: 1000 mL

## 2017-09-09 MED ORDER — HEPARIN (PORCINE) IN NACL 2-0.9 UNIT/ML-% IJ SOLN
INTRAMUSCULAR | Status: AC
  Filled 2017-09-09: qty 1000

## 2017-09-09 MED ORDER — SODIUM CHLORIDE 0.9% FLUSH: 3.0000 mL | INTRAVENOUS | Status: DC | PRN

## 2017-09-09 SURGICAL SUPPLY — 7 items
CATH INFINITI 5FR MULTPACK ANG (CATHETERS) ×2 IMPLANT
KIT HEART LEFT (KITS) ×2 IMPLANT
PACK CARDIAC CATHETERIZATION (CUSTOM PROCEDURE TRAY) ×2 IMPLANT
SHEATH PINNACLE 5F 10CM (SHEATH) ×2 IMPLANT
SYR MEDRAD MARK V 150ML (SYRINGE) ×2 IMPLANT
TRANSDUCER W/STOPCOCK (MISCELLANEOUS) ×2 IMPLANT
WIRE EMERALD 3MM-J .035X150CM (WIRE) ×2 IMPLANT

## 2017-09-09 NOTE — Progress Notes (Signed)
Dr Cena Benton in and ok to d/c home

## 2017-09-09 NOTE — Progress Notes (Signed)
Site area: Right groin a 5 french arterial sheath was removed  Site Prior to Removal:  Level 0  Pressure Applied For 20 MINUTES    Bedrest Beginning at 0830am  Manual:   Yes.    Patient Status During Pull:  stable  Post Pull Groin Site:  Level 0  Post Pull Instructions Given:  Yes.    Post Pull Pulses Present:  Yes.    Dressing Applied:  Yes.    Comments:  VS remain stable

## 2017-09-09 NOTE — Discharge Summary (Signed)
Physician Discharge Summary  Todd Greer ZOX:[id number] DOB: 05/02/1956 DOA: 09/05/2017  PCP: Ethelda Chick, MD  Admit date: 09/05/2017 Discharge date: 09/09/2017  Time spent: > 35 minutes  Recommendations for Outpatient Follow-up:  1. Metformin held on d/c due to recent heart cath. May be continued as outpatient if appropriate. Pt to go home on glipizide   Discharge Diagnoses:  Principal Problem:   CHF, acute on chronic (HCC) Active Problems:   Essential hypertension   DM II (diabetes mellitus, type II), controlled (HCC)   Acute respiratory failure with hypoxia [facility])   Discharge Condition: stable  Diet recommendation: diabetic diet/heart healthy  Filed Weights   09/07/17 0511 09/08/17 0535 09/09/17 0505  Weight: 96 kg (211 lb 9.6 oz) 95.7 kg (210 lb 14.4 oz) 96.4 kg (212 lb 8 oz)    History of present illness:  62 y.o. male with medical history significant for type 2 diabetes mellitus, hypertension, hepatitis C status post treatment with Harvoni, and recent dyspnea and orthopnea that was attributed to CHF, now presenting to the emergency department with weight gain, worsening shortness of breath, and progressive orthopnea.   Hospital Course:  1]Acute systolic congestive heart failure with severely depressed ejection fraction new onset  15-20%/multiple PVCs 2]hypertension uncontrolled at home 3]type 2 diabetes diet controlled with diet hba1c 6.9 4]history of alcohol abuse/tobacco abuse/polysubstance abuse including cocaine and heroine. 5]history of hepatitis C treated with Harvoni 6]hyperlipedemia-LDL 135-statins started.   Patient transferred to [facility] for cath tomorrow by dr Sharyn Lull and was negative for cad.continue entresto.lipitor started.start plavix    Procedures:  Cardiac cath  Consultations:  Cardiology: Dr. Sharyn Lull  Discharge Exam: Vitals:   09/09/17 0938 09/09/17 1000  BP: 100/64 103/83  Pulse: 93 91  Resp:    Temp:    SpO2: 93% 98%    General:  Pt in nad, alert and awake Cardiovascular: no cyanosis Respiratory: breathing comfortably, no wheezes  Discharge Instructions   Discharge Instructions    Call MD for:  extreme fatigue   Complete by:  As directed    Call MD for:  temperature >100.4   Complete by:  As directed    Diet - low sodium heart healthy   Complete by:  As directed    Discharge instructions   Complete by:  As directed    Please ensure follow up with your primary care physician in the next 1-2 weeks or sooner should any new concerns arise.   Increase activity slowly   Complete by:  As directed      Allergies as of 09/09/2017      Reactions   Ace Inhibitors Cough   Tolerates ARB   Amlodipine Other (See Comments)   Fatigue      Medication List    STOP taking these medications   diltiazem 60 MG 12 hr capsule Commonly known as:  CARDIZEM SR   metFORMIN 1000 MG tablet Commonly known as:  GLUCOPHAGE     TAKE these medications   aspirin 81 MG tablet Take 81 mg by mouth daily.   atorvastatin 20 MG tablet Commonly known as:  LIPITOR Take 1 tablet (20 mg total) by mouth daily at 6 PM.   Blood Glucose Monitoring Suppl Supplies Misc Use as directed   budesonide-formoterol 160-4.5 MCG/ACT inhaler Commonly known as:  SYMBICORT Inhale 2 puffs into the lungs 2 (two) times daily.   carvedilol 3.125 MG tablet Commonly known as:  COREG TAKE 1 TABLET BY MOUTH TWICE DAILY WITH A MEAL What changed:  See the new instructions.   Cholecalciferol 1000 units tablet Take 2,000 Units by mouth daily.   clopidogrel 75 MG tablet Commonly known as:  PLAVIX Take 1 tablet (75 mg total) by mouth daily.   furosemide 20 MG tablet Commonly known as:  LASIX Take 1 tablet (20 mg total) by mouth daily. Take 40mg  daily for 3 days, followed by 20mg  daily or as directed by your physician   glipiZIDE 10 MG 24 hr tablet Commonly known as:  GLUCOTROL XL TAKE 2 TABLETS BY MOUTH DAILY   ONE-A-DAY MENS PO Take 1 tablet by  mouth daily.   potassium chloride SA 20 MEQ tablet Commonly known as:  K-DUR,KLOR-CON Take 1 tablet (20 mEq total) by mouth daily.   PROVENTIL HFA 108 (90 Base) MCG/ACT inhaler Generic drug:  albuterol Inhale into the lungs every 6 (six) hours as needed for wheezing or shortness of breath.   sacubitril-valsartan 49-51 MG Commonly known as:  ENTRESTO Take 1 tablet by mouth 2 (two) times daily.   spironolactone 25 MG tablet Commonly known as:  ALDACTONE Take 0.5 tablets (12.5 mg total) by mouth daily.      Allergies  Allergen Reactions  . Ace Inhibitors Cough    Tolerates ARB  . Amlodipine Other (See Comments)    Fatigue      The results of significant diagnostics from this hospitalization (including imaging, microbiology, ancillary and laboratory) are listed below for reference.    Significant Diagnostic Studies: Dg Chest 2 View  Result Date: 09/05/2017 CLINICAL DATA:  Increasing shortness of breath and dry cough. EXAM: CHEST  2 VIEW COMPARISON:  08/27/2017 FINDINGS: Enlarged cardiac silhouette. There is no evidence of focal airspace consolidation, pleural effusion or pneumothorax. Mild interstitial pulmonary edema appear Osseous structures are without acute abnormality. Soft tissues are grossly normal. IMPRESSION: Mild interstitial pulmonary edema with enlarged cardiac silhouette, which may represent cardiomegaly or pericardial effusion. Electronically Signed   By: Ted Mcalpine M.D.   On: 09/05/2017 15:10   Dg Chest 2 View  Result Date: 08/27/2017 CLINICAL DATA:  Shortness of breath, cough EXAM: CHEST  2 VIEW COMPARISON:  05/25/2015 FINDINGS: There is no focal parenchymal opacity. There is no pleural effusion or pneumothorax. There is mild cardiomegaly. The osseous structures are unremarkable. IMPRESSION: No active cardiopulmonary disease. Electronically Signed   By: Elige Ko   On: 08/27/2017 11:17    Microbiology: No results found for this or any previous visit  (from the past 240 hour(s)).   Labs: Basic Metabolic Panel: Recent Labs  Lab 09/05/17 1739 09/06/17 0359 09/07/17 0716 09/08/17 0437 09/09/17 0500  NA 138 138 136 135 136  K 3.5 3.5 4.0 4.0 4.1  CL 105 105 103 107 107  CO2 24 24 24  21* 23  GLUCOSE 87 125* 141* 148* 154*  BUN 17 16 20 18  21*  CREATININE 0.96 1.00 1.11 0.91 0.92  CALCIUM 9.1 9.1 9.6 9.2 8.9  MG  --   --  2.0  --   --    Liver Function Tests: No results for input(s): AST, ALT, ALKPHOS, BILITOT, PROT, ALBUMIN in the last 168 hours. No results for input(s): LIPASE, AMYLASE in the last 168 hours. No results for input(s): AMMONIA in the last 168 hours. CBC: Recent Labs  Lab 09/05/17 1739 09/08/17 0437  WBC 7.0 7.3  NEUTROABS 3.2  --   HGB 13.7 16.0  HCT 40.8 47.4  MCV 81.1 82.1  PLT 231 262   Cardiac Enzymes: No results for  input(s): CKTOTAL, CKMB, CKMBINDEX, TROPONINI in the last 168 hours. BNP: BNP (last 3 results) Recent Labs    08/27/17 0924 09/05/17 1739  BNP 941.8* 1,021.1*    ProBNP (last 3 results) No results for input(s): PROBNP in the last 8760 hours.  CBG: Recent Labs  Lab 09/08/17 0753 09/08/17 1152 09/08/17 1618 09/08/17 2255 09/09/17 0814  GLUCAP 171* 161* 159* 150* 142*       Signed:  Penny Pia MD.  [facility] 09/09/2017, 1:16 PM

## 2017-09-09 NOTE — Interval H&P Note (Signed)
Cath Lab Visit (complete for each Cath Lab visit)  Clinical Evaluation Leading to the Procedure:   ACS: No.  Non-ACS:    Anginal Classification: CCS III  Anti-ischemic medical therapy: Maximal Therapy (2 or more classes of medications)  Non-Invasive Test Results: No non-invasive testing performed  Prior CABG: No previous CABG      History and Physical Interval Note:  09/09/2017 7:22 AM  Todd Greer  has presented today for surgery, with the diagnosis of hf - cm  The various methods of treatment have been discussed with the patient and family. After consideration of risks, benefits and other options for treatment, the patient has consented to  Procedure(s): LEFT HEART CATH AND CORONARY ANGIOGRAPHY (N/A) as a surgical intervention .  The patient's history has been reviewed, patient examined, no change in status, stable for surgery.  I have reviewed the patient's chart and labs.  Questions were answered to the patient's satisfaction.     Todd Greer

## 2017-09-09 NOTE — Discharge Instructions (Signed)
Femoral Site Care °Refer to this sheet in the next few weeks. These instructions provide you with information about caring for yourself after your procedure. Your health care provider may also give you more specific instructions. Your treatment has been planned according to current medical practices, but problems sometimes occur. Call your health care provider if you have any problems or questions after your procedure. °What can I expect after the procedure? °After your procedure, it is typical to have the following: °· Bruising at the site that usually fades within 1-2 weeks. °· Blood collecting in the tissue (hematoma) that may be painful to the touch. It should usually decrease in size and tenderness within 1-2 weeks. ° °Follow these instructions at home: °· Take medicines only as directed by your health care provider. °· You may shower 24-48 hours after the procedure or as directed by your health care provider. Remove the bandage (dressing) and gently wash the site with plain soap and water. Pat the area dry with a clean towel. Do not rub the site, because this may cause bleeding. °· Do not take baths, swim, or use a hot tub until your health care provider approves. °· Check your insertion site every day for redness, swelling, or drainage. °· Do not apply powder or lotion to the site. °· Limit use of stairs to twice a day for the first 2-3 days or as directed by your health care provider. °· Do not squat for the first 2-3 days or as directed by your health care provider. °· Do not lift over 10 lb (4.5 kg) for 5 days after your procedure or as directed by your health care provider. °· Ask your health care provider when it is okay to: °? Return to work or school. °? Resume usual physical activities or sports. °? Resume sexual activity. °· Do not drive home if you are discharged the same day as the procedure. Have someone else drive you. °· You may drive 24 hours after the procedure unless otherwise instructed by  your health care provider. °· Do not operate machinery or power tools for 24 hours after the procedure or as directed by your health care provider. °· If your procedure was done as an outpatient procedure, which means that you went home the same day as your procedure, a responsible adult should be with you for the first 24 hours after you arrive home. °· Keep all follow-up visits as directed by your health care provider. This is important. °Contact a health care provider if: °· You have a fever. °· You have chills. °· You have increased bleeding from the site. Hold pressure on the site. °Get help right away if: °· You have unusual pain at the site. °· You have redness, warmth, or swelling at the site. °· You have drainage (other than a small amount of blood on the dressing) from the site. °· The site is bleeding, and the bleeding does not stop after 30 minutes of holding steady pressure on the site. °· Your leg or foot becomes pale, cool, tingly, or numb. °This information is not intended to replace advice given to you by your health care provider. Make sure you discuss any questions you have with your health care provider. °Document Released: 04/22/2014 Document Revised: 01/25/2016 Document Reviewed: 03/08/2014 °Elsevier Interactive Patient Education © 2018 [employer] °Moderate Conscious Sedation, Adult, Care After °These instructions provide you with information about caring for yourself after your procedure. Your health care provider may also give you more   specific instructions. Your treatment has been planned according to current medical practices, but problems sometimes occur. Call your health care provider if you have any problems or questions after your procedure. °What can I expect after the procedure? °After your procedure, it is common: °· To feel sleepy for several hours. °· To feel clumsy and have poor balance for several hours. °· To have poor judgment for several hours. °· To vomit if you eat too  soon. ° °Follow these instructions at home: °For at least 24 hours after the procedure: ° °· Do not: °? Participate in activities where you could fall or become injured. °? Drive. °? Use heavy machinery. °? Drink alcohol. °? Take sleeping pills or medicines that cause drowsiness. °? Make important decisions or sign legal documents. °? Take care of children on your own. °· Rest. °Eating and drinking °· Follow the diet recommended by your health care provider. °· If you vomit: °? Drink water, juice, or soup when you can drink without vomiting. °? Make sure you have little or no nausea before eating solid foods. °General instructions °· Have a responsible adult stay with you until you are awake and alert. °· Take over-the-counter and prescription medicines only as told by your health care provider. °· If you smoke, do not smoke without supervision. °· Keep all follow-up visits as told by your health care provider. This is important. °Contact a health care provider if: °· You keep feeling nauseous or you keep vomiting. °· You feel light-headed. °· You develop a rash. °· You have a fever. °Get help right away if: °· You have trouble breathing. °This information is not intended to replace advice given to you by your health care provider. Make sure you discuss any questions you have with your health care provider. °Document Released: 06/09/2013 Document Revised: 01/22/2016 Document Reviewed: 12/09/2015 °Elsevier Interactive Patient Education © 2018 [employer] ° °

## 2017-09-09 NOTE — Progress Notes (Signed)
Medic is here to transport patient to [facility] for heart cath today.

## 2019-12-20 ENCOUNTER — Ambulatory Visit: Payer: 59 | Admitting: Family Medicine

## 2019-12-20 ENCOUNTER — Other Ambulatory Visit: Payer: Self-pay

## 2019-12-20 ENCOUNTER — Encounter: Payer: Self-pay | Admitting: Family Medicine

## 2019-12-20 VITALS — BP 121/79 | HR 85 | Temp 98.0°F | Ht 67.0 in | Wt 214.2 lb

## 2019-12-20 DIAGNOSIS — I10 Essential (primary) hypertension: Secondary | ICD-10-CM | POA: Diagnosis not present

## 2019-12-20 DIAGNOSIS — Z794 Long term (current) use of insulin: Secondary | ICD-10-CM | POA: Diagnosis not present

## 2019-12-20 DIAGNOSIS — I5023 Acute on chronic systolic (congestive) heart failure: Secondary | ICD-10-CM

## 2019-12-20 DIAGNOSIS — E1142 Type 2 diabetes mellitus with diabetic polyneuropathy: Secondary | ICD-10-CM

## 2019-12-20 DIAGNOSIS — E1165 Type 2 diabetes mellitus with hyperglycemia: Secondary | ICD-10-CM

## 2019-12-20 DIAGNOSIS — I5022 Chronic systolic (congestive) heart failure: Secondary | ICD-10-CM

## 2019-12-20 LAB — POCT GLYCOSYLATED HEMOGLOBIN (HGB A1C): Hemoglobin A1C: 12.2 % — AB (ref 4.0–5.6)

## 2019-12-20 MED ORDER — JARDIANCE 10 MG PO TABS: 10.0000 mg | ORAL_TABLET | Freq: Every day | ORAL | 3 refills | Status: DC

## 2019-12-20 NOTE — Progress Notes (Signed)
New Patient Office Visit  Subjective:  Patient ID: Todd Greer, male    DOB: [birth year]  Age: 64 y.o. MRN: [id number]  CC:  Chief Complaint  Patient presents with  . Establish Care  . Congestive Heart Failure    regulate   . Diabetes    HPI Todd Greer presents for   Diabetes Mellitus: Patient presents for follow up of diabetes. Symptoms: hyperglycemia. Symptoms have gradually worsened. Patient denies hypoglycemia , nausea, polydipsia, polyuria and visual disturbances.  Evaluation to date has been included: hemoglobin A1C.  Home sugars: patient does not check sugars. Treatment to date: Continued sulfonylurea which has been effective and Continued metformin which has been effective.  Lab Results  Component Value Date   HGBA1C 12.2 (A) 12/20/2019   Lab Results  Component Value Date   HGBA1C 12.2 (A) 12/20/2019     Hypertension: Patient here for follow-up of elevated blood pressure. He is exercising and is adherent to low salt diet.  Blood pressure is well controlled at home. Cardiac symptoms none. Patient denies chest pain, chest pressure/discomfort, dyspnea, exertional chest pressure/discomfort, fatigue and lower extremity edema.  Cardiovascular risk factors: diabetes mellitus. Use of agents associated with hypertension: none. History of target organ damage: heart failure. Home bp readings 120s/89-90 BP Readings from Last 3 Encounters:  12/20/19 121/79  09/09/17 103/83  08/27/17 131/90    Heart Failure Pt reports that he has less energy He used to lift weights for exercise prior to his diagnosis and catheterization.   He is scared to exercise again.  He is motivated and wants to exercise.  He denies lower extremity edema and avoids salty foods. He has heard of Jardiance but states that his insurance does not cover it. He has not been to the ER recently for heart failure. He does not smoke   Catheterization in 2019  There is severe left ventricular systolic  dysfunction.  LV end diastolic pressure is normal.  The left ventricular ejection fraction is less than 25% by visual estimate.   Past Medical History:  Diagnosis Date  . Anxiety   . Diabetes mellitus type II, controlled, with no complications ([place])   . Hepatitis C, chronic ([place])   . Hypertension   . Obesity, Class II, BMI 35-39.9, with comorbidity   . Obesity, diabetes, and hypertension syndrome ([place])   . Substance abuse [facility])     Past Surgical History:  Procedure Laterality Date  . LEFT HEART CATH AND CORONARY ANGIOGRAPHY N/A 09/09/2017   Procedure: LEFT HEART CATH AND CORONARY ANGIOGRAPHY;  Surgeon: Charolette Forward, MD;  Location: [facility];  Service: Cardiovascular;  Laterality: N/A;  . NM  EXERCISE MYOVIEW LTD  06-2013   No ischemia or infarction; note dated images    Family History  Problem Relation Age of Onset  . Stroke Father   . Diabetes Father   . Hypertension Father   . Cancer Sister   . Cerebral aneurysm Mother        Died at a young age.  . Diabetes Paternal Grandmother   . Hypertension Paternal Grandmother   . Stroke Paternal Grandmother     Social History   Socioeconomic History  . Marital status: Married    Spouse name: Not on file  . Number of children: Not on file  . Years of education: Not on file  . Highest education level: Not on file  Occupational History  . Not on file  Tobacco Use  . Smoking status: Former Smoker  Packs/day: 0.50    Years: 10.00    Pack years: 5.00    Types: Cigarettes    Quit date: 10/04/1983    Years since quitting: 36.2  . Smokeless tobacco: Never Used  Substance and Sexual Activity  . Alcohol use: Yes    Alcohol/week: 6.0 standard drinks    Types: 6 Standard drinks or equivalent per week  . Drug use: No  . Sexual activity: Yes  Other Topics Concern  . Not on file  Social History Narrative   His married father of 4.  His married for 28 years.  He lives with his wife and daughters.  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  Education: [employer].   He quit smoking in 1985.  He drinks 3 beers a week.   Prior to the onset of his current symptoms, used to work out with weights for at least 40 minutes a day for 3 times a week.  He usually did light weights for many occasions as opposed to heavy weights.      Contacts: Wife Todd Greer; daughter Todd Greer   Social Determinants of Health   Financial Resource Strain:   . Difficulty of Paying Living Expenses:   Food Insecurity:   . Worried About Charity fundraiser in the Last Year:   . Arboriculturist in the Last Year:   Transportation Needs:   . Film/video editor (Medical):   Todd Greer Lack of Transportation (Non-Medical):   Physical Activity:   . Days of Exercise per Week:   . Minutes of Exercise per Session:   Stress:   . Feeling of Stress :   Social Connections:   . Frequency of Communication with Friends and Family:   . Frequency of Social Gatherings with Friends and Family:   . Attends Religious Services:   . Active Member of Clubs or Organizations:   . Attends Archivist Meetings:   Todd Greer Marital Status:   Intimate Partner Violence:   . Fear of Current or Ex-Partner:   . Emotionally Abused:   Todd Greer Physically Abused:   . Sexually Abused:     ROS Review of Systems Review of Systems  Constitutional: Negative for activity change, appetite change, chills and fever.  HENT: Negative for congestion, nosebleeds, trouble swallowing and voice change.   Respiratory: Negative for cough, shortness of breath and wheezing.   Gastrointestinal: Negative for diarrhea, nausea and vomiting.  Genitourinary: Negative for difficulty urinating, dysuria, flank pain and hematuria.  Musculoskeletal: Negative for back pain, joint swelling and neck pain.  Neurological: Negative for dizziness, speech difficulty, light-headedness and numbness.  See HPI. All other review of systems negative.    Objective:   Today's Vitals: BP 121/79   Pulse 85   Temp 98 F (36.7 C) (Temporal)   Ht '5\' 7"'  (1.702 m)   Wt 214 lb 3.2 oz (97.2 kg)   SpO2 96%   BMI 33.55 kg/m   Physical Exam Physical Exam  Constitutional: Oriented to person, place, and time. Appears well-developed and well-nourished.  HENT:  Head: Normocephalic and atraumatic.  Eyes: Conjunctivae and EOM are normal.  Cardiovascular: Normal rate, regular rhythm, normal heart sounds and intact distal pulses.  No murmur heard. Pulmonary/Chest: Effort normal and breath sounds normal. No stridor. No respiratory distress. Has no wheezes.  Neurological: Is alert and oriented to person, place, and time.  Skin: Skin is warm. Capillary refill takes less than 2 seconds.  Psychiatric: Has a normal mood and affect. Behavior is normal. Judgment and thought content normal.   Assessment & Plan:   Problem List Items Addressed This Visit      Cardiovascular and Mediastinum   Essential hypertension- Patient's blood pressure is at goal of 139/89 or less. Condition is stable. Continue current medications and treatment plan. I recommend that you exercise for 30-45 minutes 5 days a week. I also recommend a balanced diet with fruits and vegetables every day, lean meats, and little fried foods. The DASH diet (you can find this online) is a good example of this.    Relevant Medications   LOSARTAN POTASSIUM PO   carvedilol (COREG) 12.5 MG tablet   CHF, acute on chronic (HCC)   Relevant Medications   LOSARTAN POTASSIUM PO   carvedilol (COREG) 12.5 MG tablet     Endocrine   DM II (diabetes mellitus, type II), controlled ([place]) - Primary   Relevant Medications   LOSARTAN POTASSIUM PO   empagliflozin (JARDIANCE) 10 MG TABS tablet    Other Visit Diagnoses    Chronic systolic heart failure ([place])    -  Continue monitoring diet Referral placed for Cardiac Rehab Advised to make follow up with Cardiologist Dr. Terrence Dupont Continue low sodium diet    Relevant Medications   LOSARTAN POTASSIUM PO   carvedilol (COREG) 12.5 MG tablet   empagliflozin (JARDIANCE) 10 MG TABS tablet   Other Relevant Orders   Microalbumin, urine   CMP14+EGFR   TSH   CBC   Lipid panel   Ambulatory referral to diabetic education   Amb Referral to Cardiac Rehabilitation   Type 2 diabetes mellitus with diabetic polyneuropathy, with long-term current use of insulin ([place])    -  Discussed that Jardiance has been approved now for heart failure to reduce heart failure exacerbations If it not approved would do prior authorization as it would help his blood glucose substantially while improving his heart failure outcomes since he has an EF 25% Continue all other meds - metformin and glipizide   Relevant Medications   LOSARTAN POTASSIUM PO   empagliflozin (JARDIANCE) 10 MG TABS tablet   Other Relevant Orders   Microalbumin, urine   POCT glycosylated hemoglobin (Hb A1C) (Completed)   CMP14+EGFR   Ambulatory referral to diabetic education        Outpatient Encounter Medications as of 12/20/2019  Medication Sig  . aspirin 81 MG tablet Take 81 mg by mouth daily.  Todd Greer atorvastatin (LIPITOR) 20 MG tablet Take 1 tablet (20 mg total) by mouth daily at 6 PM.  . Blood Glucose Monitoring Suppl SUPPLIES MISC Use as directed  . carvedilol (COREG) 12.5 MG tablet Take 12.5 mg by mouth 2 (two) times daily.  . furosemide (LASIX) 20 MG tablet Take 1 tablet (20 mg total) by mouth daily. Take 82m daily for 3 days, followed by 238mdaily or as directed by your physician  . glipiZIDE (GLUCOTROL XL) 10 MG 24 hr tablet TAKE 2 TABLETS BY MOUTH DAILY  . LOSARTAN POTASSIUM PO Take 100 mg by mouth daily.  . Multiple Vitamin (ONE-A-DAY MENS PO) Take 1 tablet by mouth daily.  . potassium chloride SA (K-DUR,KLOR-CON) 20 MEQ tablet Take 1 tablet (20 mEq total) by mouth daily.  . Todd Kitchenpironolactone (ALDACTONE) 25 MG tablet Take 0.5 tablets (12.5 mg total) by mouth daily.  . [DISCONTINUED] albuterol  (PROVENTIL HFA) 108 (90 Base) MCG/ACT inhaler Inhale into the lungs every 6 (six) hours as needed for wheezing or shortness  of breath.  . empagliflozin (JARDIANCE) 10 MG TABS tablet Take 10 mg by mouth daily before breakfast.  . [DISCONTINUED] budesonide-formoterol (SYMBICORT) 160-4.5 MCG/ACT inhaler Inhale 2 puffs into the lungs 2 (two) times daily.  . [DISCONTINUED] carvedilol (COREG) 3.125 MG tablet TAKE 1 TABLET BY MOUTH TWICE DAILY WITH A MEAL (Patient taking differently: TAKE 1 TABLET BY MOUTH DAILY WITH A MEAL)  . [DISCONTINUED] Cholecalciferol 1000 units tablet Take 2,000 Units by mouth daily.  . [DISCONTINUED] clopidogrel (PLAVIX) 75 MG tablet Take 1 tablet (75 mg total) by mouth daily.  . [DISCONTINUED] sacubitril-valsartan (ENTRESTO) 49-51 MG Take 1 tablet by mouth 2 (two) times daily.   No facility-administered encounter medications on file as of 12/20/2019.    A total of 50 minutes were spent face-to-face with the patient during this encounter and over half of that time was spent on counseling and coordination of care.  Follow-up: Return in about 2 months (around 02/19/2020) for Diabetes follow up Dr. Carlota Raspberry since pt is in the [facility].   Forrest Moron, MD

## 2019-12-20 NOTE — Patient Instructions (Addendum)
  1.  New medication added - Jardiance If the [place] is not covered will apply for prior authorization because [place] is approved for diabetes with systolic heart failure. 2.  Continue current medications 3. Follow up with Cardiac Rehabilitation as scheduled 4. Follow up with Diabetic education classes for tips and goal setting. 5.  Contact Dr. Sharyn Lull for follow up on heart  6. Follow up for diabetes should be in 2 months   If you have lab work done today you will be contacted with your lab results within the next 2 weeks.  If you have not heard from [place] then please contact us. The fastest way to get your results is to register for My Chart.   IF you received an x-ray today, you will receive an invoice from [facility]. Please contact [facility] at [phone] with questions or concerns regarding your invoice.   IF you received labwork today, you will receive an invoice from [facility]. Please contact [facility] at [phone] with questions or concerns regarding your invoice.   Our billing staff will not be able to assist you with questions regarding bills from these companies.  You will be contacted with the lab results as soon as they are available. The fastest way to get your results is to activate your My Chart account. Instructions are located on the last page of this paperwork. If you have not heard from [place] regarding the results in 2 weeks, please contact this office.

## 2019-12-21 ENCOUNTER — Encounter (HOSPITAL_COMMUNITY): Payer: Self-pay | Admitting: *Deleted

## 2019-12-21 ENCOUNTER — Telehealth (HOSPITAL_COMMUNITY): Payer: Self-pay

## 2019-12-21 LAB — CBC
Hematocrit: 43.9 % (ref 37.5–51.0)
Hemoglobin: 14.9 g/dL (ref 13.0–17.7)
MCH: 29.3 pg (ref 26.6–33.0)
MCHC: 33.9 g/dL (ref 31.5–35.7)
MCV: 86 fL (ref 79–97)
Platelets: 209 10*3/uL (ref 150–450)
RBC: 5.09 x10E6/uL (ref 4.14–5.80)
RDW: 13.1 % (ref 11.6–15.4)
WBC: 7.6 10*3/uL (ref 3.4–10.8)

## 2019-12-21 LAB — LIPID PANEL
Chol/HDL Ratio: 2.5 ratio (ref 0.0–5.0)
Cholesterol, Total: 146 mg/dL (ref 100–199)
HDL: 59 mg/dL (ref >39)
LDL Chol Calc (NIH): 69 mg/dL (ref 0–99)
Triglycerides: 101 mg/dL (ref 0–149)
VLDL Cholesterol Cal: 18 mg/dL (ref 5–40)

## 2019-12-21 LAB — CMP14+EGFR
ALT: 73 IU/L — ABNORMAL HIGH (ref 0–44)
AST: 55 IU/L — ABNORMAL HIGH (ref 0–40)
Albumin/Globulin Ratio: 1.3 (ref 1.2–2.2)
Albumin: 4.2 g/dL (ref 3.8–4.8)
Alkaline Phosphatase: 71 IU/L (ref 39–117)
BUN/Creatinine Ratio: 16 (ref 10–24)
BUN: 18 mg/dL (ref 8–27)
Bilirubin Total: 0.3 mg/dL (ref 0.0–1.2)
CO2: 22 mmol/L (ref 20–29)
Calcium: 9.7 mg/dL (ref 8.6–10.2)
Chloride: 99 mmol/L (ref 96–106)
Creatinine, Ser: 1.1 mg/dL (ref 0.76–1.27)
GFR calc Af Amer: 82 mL/min/{1.73_m2} (ref >59)
GFR calc non Af Amer: 71 mL/min/{1.73_m2} (ref >59)
Globulin, Total: 3.3 g/dL (ref 1.5–4.5)
Glucose: 180 mg/dL — ABNORMAL HIGH (ref 65–99)
Potassium: 4.4 mmol/L (ref 3.5–5.2)
Sodium: 135 mmol/L (ref 134–144)
Total Protein: 7.5 g/dL (ref 6.0–8.5)

## 2019-12-21 LAB — MICROALBUMIN, URINE: Microalbumin, Urine: 16.9 ug/mL

## 2019-12-21 LAB — TSH: TSH: 2.63 u[IU]/mL (ref 0.450–4.500)

## 2019-12-21 NOTE — Telephone Encounter (Signed)
Called and spoke with pt in regards to CR, adv pt he will need to contact Dr. Sharyn Lull office to schedule and complete his f/u appt before we can schedule him for CR. Went over insurance, patient verbalized understanding. Will contact patient for scheduling once f/u has been completed.

## 2019-12-21 NOTE — Telephone Encounter (Signed)
Pt is interested in participating in Virtual Cardiac and Pulmonary Rehab. Pt advised that Virtual Cardiac and Pulmonary Rehab is provided at no cost to the patient.  Checklist:  1. Pt has smart device  ie smartphone and/or ipad for downloading an app  Yes 2. Reliable internet/wifi service    Yes 3. Understands how to use their smartphone and navigate within an app.  Yes   Pt verbalized understanding and is in agreement.  

## 2019-12-21 NOTE — Progress Notes (Signed)
Received referral notification from Dr. Creta Levin for this pt to participate in Outpatient Cardiac rehab phase II with the diagnosis of Chronic Systolic Heart Failure.  Pt is also seen by Dr. Sharyn Lull and completed cardiac cath in 2019 which showed EF less than 25%. Called to request most recent office note from Dr. Sharyn Lull.  Pt seen in Decemer 2020 but missed his last app.  Office has made attempts to get this appt rescheduled.  Wil let pt know he will need to contact Dr. Sharyn Lull office to reschedule and complete this app in order to schedule for cardiac rehab. Clinical review of pt follow up appt on 4/19  with Dr. Creta Levin - primary care office note.  Pt Covid score is 6.  Will forward to staff for insurance verification and contacting this pt for follow up. Alanson Aly, BSN Cardiac and Emergency planning/management officer

## 2019-12-21 NOTE — Telephone Encounter (Signed)
Pt insurance is active and benefits verified through [facility]. Co-pay $60.00, DED $0.00/$0.00 met, out of pocket $6,800.00/$83.44 met, co-insurance 0%. No pre-authorization required. [place], 12/21/19 @ 1:58PM, ZOQ#[id number]  Will contact patient to see if he is interested in the Cardiac Rehab Program. If interested, patient will need to complete follow up appt. Once completed, patient will be contacted for scheduling upon review by the RN Navigator.

## 2019-12-23 ENCOUNTER — Telehealth: Payer: Self-pay

## 2019-12-23 NOTE — Telephone Encounter (Signed)
Pts pharmacy requesting alternative to Jardiance as the expense is over $100 monthly

## 2019-12-23 NOTE — Telephone Encounter (Signed)
Please call pharmacy, have them check to see if there is an alternative listed that may be less expensive with his insurance coverage. If not let me know and I can look into other options.

## 2019-12-24 NOTE — Telephone Encounter (Signed)
Pt pharmacy will look into what will be covered better by pt insurance, then will call back

## 2020-01-24 ENCOUNTER — Telehealth (HOSPITAL_COMMUNITY): Payer: Self-pay

## 2020-01-24 NOTE — Telephone Encounter (Signed)
Called and spoke with pt in regards to CR, pt has not completed his f/u appt. He stated he is not interested in CR at this time.  Closed referral

## 2020-02-21 ENCOUNTER — Ambulatory Visit: Payer: 59 | Admitting: Family Medicine

## 2020-09-11 ENCOUNTER — Ambulatory Visit (INDEPENDENT_AMBULATORY_CARE_PROVIDER_SITE_OTHER): Payer: Self-pay | Admitting: Family

## 2020-09-11 ENCOUNTER — Other Ambulatory Visit: Payer: Self-pay

## 2020-09-11 ENCOUNTER — Encounter: Payer: Self-pay | Admitting: Family

## 2020-09-11 VITALS — BP 122/88 | HR 91 | Ht 67.21 in | Wt 216.9 lb

## 2020-09-11 DIAGNOSIS — Z7689 Persons encountering health services in other specified circumstances: Secondary | ICD-10-CM

## 2020-09-11 DIAGNOSIS — E1142 Type 2 diabetes mellitus with diabetic polyneuropathy: Secondary | ICD-10-CM

## 2020-09-11 DIAGNOSIS — Z794 Long term (current) use of insulin: Secondary | ICD-10-CM

## 2020-09-11 DIAGNOSIS — I509 Heart failure, unspecified: Secondary | ICD-10-CM

## 2020-09-11 LAB — POCT GLYCOSYLATED HEMOGLOBIN (HGB A1C): Hemoglobin A1C: 7.6 % — AB (ref 4.0–5.6)

## 2020-09-11 LAB — GLUCOSE, POCT (MANUAL RESULT ENTRY): POC Glucose: 120 mg/dl — AB (ref 70–99)

## 2020-09-11 NOTE — Patient Instructions (Signed)
Return in 4 to 6 weeks or sooner if needed for annual physical examination, labs, and health maintenance. Arrive fasting meaning having had no food and/or nothing to drink for at least 8 hours prior to appointment.  Continue Metformin 2500 mg daily.   Follow-up with clinical pharmacist in 1 - 2 weeks for diabetes checkup. Write your home blood sugar results down each day and bring those results to your appointment along with your home glucose monitor.   Keep all appointments with Cardiology. Thank you for choosing Primary Care at [facility] for your medical home!    [name] was seen by Rema Fendt, NP today.   Annamaria Helling primary care provider is Crystallee Werden Jodi Geralds, NP.   For the best care possible,  you should try to see Ricky Stabs, NP whenever you come to clinic.   We look forward to seeing you again soon!  If you have any questions about your visit today,  please call us at [phone]  Or feel free to reach your provider via MyChart.    Diabetes Mellitus and Nutrition, Adult When you have diabetes, or diabetes mellitus, it is very important to have healthy eating habits because your blood sugar (glucose) levels are greatly affected by what you eat and drink. Eating healthy foods in the right amounts, at about the same times every day, can help you:  Control your blood glucose.  Lower your risk of heart disease.  Improve your blood pressure.  Reach or maintain a healthy weight. What can affect my meal plan? Every person with diabetes is different, and each person has different needs for a meal plan. Your health care provider may recommend that you work with a dietitian to make a meal plan that is best for you. Your meal plan may vary depending on factors such as:  The calories you need.  The medicines you take.  Your weight.  Your blood glucose, blood pressure, and cholesterol levels.  Your activity level.  Other health conditions you have, such as  heart or kidney disease. How do carbohydrates affect me? Carbohydrates, also called carbs, affect your blood glucose level more than any other type of food. Eating carbs naturally raises the amount of glucose in your blood. Carb counting is a method for keeping track of how many carbs you eat. Counting carbs is important to keep your blood glucose at a healthy level, especially if you use insulin or take certain oral diabetes medicines. It is important to know how many carbs you can safely have in each meal. This is different for every person. Your dietitian can help you calculate how many carbs you should have at each meal and for each snack. How does alcohol affect me? Alcohol can cause a sudden decrease in blood glucose (hypoglycemia), especially if you use insulin or take certain oral diabetes medicines. Hypoglycemia can be a life-threatening condition. Symptoms of hypoglycemia, such as sleepiness, dizziness, and confusion, are similar to symptoms of having too much alcohol.  Do not drink alcohol if: ? Your health care provider tells you not to drink. ? You are pregnant, may be pregnant, or are planning to become pregnant.  If you drink alcohol: ? Do not drink on an empty stomach. ? Limit how much you use to:  0-1 drink a day for women.  0-2 drinks a day for men. ? Be aware of how much alcohol is in your drink. In the [place] one drink equals one 12 oz bottle of  beer (355 mL), one 5 oz glass of wine (148 mL), or one 1 oz glass of hard liquor (44 mL). ? Keep yourself hydrated with water, diet soda, or unsweetened iced tea.  Keep in mind that regular soda, juice, and other mixers may contain a lot of sugar and must be counted as carbs. What are tips for following this plan? Reading food labels  Start by checking the serving size on the "Nutrition Facts" label of packaged foods and drinks. The amount of calories, carbs, fats, and other nutrients listed on the label is based on one serving of  the item. Many items contain more than one serving per package.  Check the total grams (g) of carbs in one serving. You can calculate the number of servings of carbs in one serving by dividing the total carbs by 15. For example, if a food has 30 g of total carbs per serving, it would be equal to 2 servings of carbs.  Check the number of grams (g) of saturated fats and trans fats in one serving. Choose foods that have a low amount or none of these fats.  Check the number of milligrams (mg) of salt (sodium) in one serving. Most people should limit total sodium intake to less than 2,300 mg per day.  Always check the nutrition information of foods labeled as "low-fat" or "nonfat." These foods may be higher in added sugar or refined carbs and should be avoided.  Talk to your dietitian to identify your daily goals for nutrients listed on the label. Shopping  Avoid buying canned, pre-made, or processed foods. These foods tend to be high in fat, sodium, and added sugar.  Shop around the outside edge of the grocery store. This is where you will most often find fresh fruits and vegetables, bulk grains, fresh meats, and fresh dairy. Cooking  Use low-heat cooking methods, such as baking, instead of high-heat cooking methods like deep frying.  Cook using healthy oils, such as olive, canola, or sunflower oil.  Avoid cooking with butter, cream, or high-fat meats. Meal planning  Eat meals and snacks regularly, preferably at the same times every day. Avoid going long periods of time without eating.  Eat foods that are high in fiber, such as fresh fruits, vegetables, beans, and whole grains. Talk with your dietitian about how many servings of carbs you can eat at each meal.  Eat 4-6 oz (112-168 g) of lean protein each day, such as lean meat, chicken, fish, eggs, or tofu. One ounce (oz) of lean protein is equal to: ? 1 oz (28 g) of meat, chicken, or fish. ? 1 egg. ?  cup (62 g) of tofu.  Eat some  foods each day that contain healthy fats, such as avocado, nuts, seeds, and fish.   What foods should I eat? Fruits Berries. Apples. Oranges. Peaches. Apricots. Plums. Grapes. Mango. Papaya. Pomegranate. Kiwi. Cherries. Vegetables Lettuce. Spinach. Leafy greens, including kale, chard, collard greens, and mustard greens. Beets. Cauliflower. Cabbage. Broccoli. Carrots. Green beans. Tomatoes. Peppers. Onions. Cucumbers. Brussels sprouts. Grains Whole grains, such as whole-wheat or whole-grain bread, crackers, tortillas, cereal, and pasta. Unsweetened oatmeal. Quinoa. Brown or wild rice. Meats and other proteins Seafood. Poultry without skin. Lean cuts of poultry and beef. Tofu. Nuts. Seeds. Dairy Low-fat or fat-free dairy products such as milk, yogurt, and cheese. The items listed above may not be a complete list of foods and beverages you can eat. Contact a dietitian for more information. What foods should I avoid?  Fruits Fruits canned with syrup. Vegetables Canned vegetables. Frozen vegetables with butter or cream sauce. Grains Refined white flour and flour products such as bread, pasta, snack foods, and cereals. Avoid all processed foods. Meats and other proteins Fatty cuts of meat. Poultry with skin. Breaded or fried meats. Processed meat. Avoid saturated fats. Dairy Full-fat yogurt, cheese, or milk. Beverages Sweetened drinks, such as soda or iced tea. The items listed above may not be a complete list of foods and beverages you should avoid. Contact a dietitian for more information. Questions to ask a health care provider  Do I need to meet with a diabetes educator?  Do I need to meet with a dietitian?  What number can I call if I have questions?  When are the best times to check my blood glucose? Where to find more information:  American Diabetes Association: diabetes.org  Academy of Nutrition and Dietetics: www.eatright.[employer] of Diabetes and Digestive  and Kidney Diseases: CarFlippers.tn  Association of Diabetes Care and Education Specialists: www.diabeteseducator.org Summary  It is important to have healthy eating habits because your blood sugar (glucose) levels are greatly affected by what you eat and drink.  A healthy meal plan will help you control your blood glucose and maintain a healthy lifestyle.  Your health care provider may recommend that you work with a dietitian to make a meal plan that is best for you.  Keep in mind that carbohydrates (carbs) and alcohol have immediate effects on your blood glucose levels. It is important to count carbs and to use alcohol carefully. This information is not intended to replace advice given to you by your health care provider. Make sure you discuss any questions you have with your health care provider. Document Revised: 07/27/2019 Document Reviewed: 07/27/2019 Elsevier Patient Education  2021 [employer].

## 2020-09-11 NOTE — Progress Notes (Signed)
Subjective:    Todd Greer - 65 y.o. male MRN [id number]  Date of birth: [birth year]  HPI  Todd Greer is to establish care. Patient has a PMH significant for essential hypertension, CHF acute on chronic, acute respiratory failure with hypoxia, diabetes mellitus type II, and dyslipidemia.   Current issues and/or concerns: 1. DIABETES TYPE 2 FOLLOW-UP: 12/20/2019: Visit with Dr. Creta Levin. Discussed Jardiance approved for heart failure to reduce heart failure exacerbations. If not approved would do a prior authorization as it would help with blood glucose substantially while improving heart failure outcomes since he has an EF 25%. Continued on Metformin and Glipizide.  09/11/2020:  Last A1C:  7.6% on 09/11/2020 Are you fasting today: [x]  No, bowl of cereal, beef and rice, sparkiling water Have you taken your anti-diabetic medications today: [x]  Yes []  No  Med Adherence:  []  Yes    [x]  No, was not able to begin Jardiance related to financial concerns. Says Dr. recently decreased Metformin from 3500 mg/daily to 2500 mg/daily. Medication side effects:  [x]  Yes, dizziness sometimes and feeling dry  Home Monitoring?  [x]  Yes    []  No Home glucose results range: 110-190 morning  Diet Adherence: []  Yes    [x]  No Exercise: [x]  Yes    []  No Hypoglycemic episodes?: []  Yes    [x]  No Numbness of the feet? [x]  Yes    []  No Retinopathy hx? []  Yes    [x]  No Last eye exam: over 1 year   2. HEART FAILURE: 12/20/2019: Visit with Dr. . Continue to monitor diet. Referral placed for Cardiac Rehab. Advised to follow-up with Dr. . Continue low-sodium diet. Continued on Losartan, Carvedilol, and Jardiance.  09/11/2020: Currently taking:  See med list Taking ACEI/ARB?  yes Taking beta blocker?  yes Med Adherence:  yes Medication side effects:  none Adherence with salt restriction:  yes Shortness of breath?  no Leg swelling?  no Monitoring weight at home?  yes yes  Weight change?   Stays around 210 pounds Last echocardiogram:  2019 Last ejection fraction:  15 - 20% Followed by Cardiology?  yes Date last seen by Cardiology:  December 2021, next appointment March 2022   ROS per HPI   Health Maintenance:  Health Maintenance Due  Topic Date Due  . OPHTHALMOLOGY EXAM  08/03/2015   Past Medical History: Patient Active Problem List   Diagnosis Date Noted  . CHF, acute on chronic (HCC) 09/05/2017  . Acute respiratory failure with hypoxia (HCC) 09/05/2017  . Hypertrophy of prostate without urinary obstruction and other lower urinary tract symptoms (LUTS) 12/03/2013  . Erectile dysfunction 08/20/2013  . Personal history of colonic polyps 07/20/2013  . Exertional dyspnea 06/01/2013    Class: Acute  . Obesity (BMI 30-39.9) 06/01/2013  . Abnormal resting ECG findings - bifascicular block with PVCs 06/01/2013  . DM II (diabetes mellitus, type II), controlled (HCC) 05/28/2013  . DYSLIPIDEMIA 02/11/2007  . ANXIETY DISORDER 02/11/2007  . ALCOHOL USE 02/11/2007  . Essential hypertension 02/11/2007    Social History   reports that he quit smoking about 36 years ago. His smoking use included cigarettes. He has a 5.00 pack-year smoking history. He has never used smokeless tobacco. He reports current alcohol use of about 6.0 standard drinks of alcohol per week. He reports that he does not use drugs.   Family History  family history includes Cancer in his sister; Cerebral aneurysm in his mother; Diabetes in his father and paternal grandmother; Hypertension in his father  and paternal grandmother; Stroke in his father and paternal grandmother.   Medications: reviewed and updated   Objective:   Physical Exam BP 122/88 (BP Location: Left Arm, Patient Position: Sitting)   Pulse 91   Ht 5' 7.21" (1.707 m)   Wt 216 lb 14.4 oz (98.4 kg)   SpO2 96%   BMI 33.76 kg/m  Physical Exam Constitutional:      Appearance: He is obese.  HENT:     Head: Normocephalic.  Eyes:      Extraocular Movements: Extraocular movements intact.     Pupils: Pupils are equal, round, and reactive to light.  Cardiovascular:     Rate and Rhythm: Normal rate and regular rhythm.     Pulses: Normal pulses.  Pulmonary:     Effort: Pulmonary effort is normal.     Breath sounds: Normal breath sounds.  Musculoskeletal:     Cervical back: Normal range of motion and neck supple.  Neurological:     General: No focal deficit present.     Mental Status: He is alert and oriented to person, place, and time.  Psychiatric:        Mood and Affect: Mood normal.        Behavior: Behavior normal.       Assessment & Plan:  1. Encounter to establish care: - Patient presents today to establish care.  - Return in 4 to 6 weeks or sooner if needed for annual physical examination, labs, and health maintenance. Arrive fasting meaning having had no food and/or nothing to drink for at least 8 hours prior to appointment.  2. Type 2 diabetes mellitus with diabetic polyneuropathy, with long-term current use of insulin (HCC): - Hemoglobin A1C close to goal today at 7.6%, goal < 7%.  - Non-fasting CBG 120. - To achieve an A1C goal of less than or equal to 7.0 percent, a fasting blood sugar of 80 to 130 mg/dL and a postprandial glucose (90 to 120 minutes after a meal) less than 180 mg/dL. In the event of sugars less than 60 mg/dl or greater than 427 mg/dl please notify the clinic ASAP. It is recommended that you undergo annual eye exams and annual foot exams. - Discussed the importance of healthy eating habits, low-carbohydrate diet, low-sugar diet, regular aerobic exercise (at least 150 minutes a week as tolerated) and medication compliance to achieve or maintain control of diabetes. - Patient reports his Cardiologist Dr. Sharyn Lull recently decreased Metformin from 3500 mg/daily to 2500 mg/daily. - Continue Metformin and Glipizide as prescribed.  - Patient reports he was not able to take Jardiance as previously  prescribed related to financial concerns. - Patient reports some dizziness especially when standing quickly and feeling dry. Says there has always been a level of dizziness even before adjustments in medication. - Follow-up with clinical pharmacist in 1 - 2 weeks for diabetes checkup. Write your home blood sugar results down each day and bring those results to your appointment along with your home glucose monitor. Medications may be revised at that time if needed. - BMP to check kidney function and electrolyte balance.  - Referral to Ophthalmology for routine diabetic eye examination. - POCT glycosylated hemoglobin (Hb A1C) - Glucose (CBG) - Ambulatory referral to Ophthalmology - Basic Metabolic Panel  3. Acute on chronic congestive heart failure, unspecified heart failure type [facility]): - Patient stable in clinic today without respiratory or cardiac distress. - Keep all appointments with Cardiology.   Ricky Stabs, NP 09/11/2020, 4:37 PM Primary Care  at [facility]

## 2020-09-11 NOTE — Progress Notes (Signed)
Establish care  Pt taking 3500mg  of metformin daily  Experiencing lightheaded and dizziness

## 2020-09-12 LAB — BASIC METABOLIC PANEL
BUN/Creatinine Ratio: 16 (ref 10–24)
BUN: 20 mg/dL (ref 8–27)
CO2: 20 mmol/L (ref 20–29)
Calcium: 9.7 mg/dL (ref 8.6–10.2)
Chloride: 101 mmol/L (ref 96–106)
Creatinine, Ser: 1.22 mg/dL (ref 0.76–1.27)
GFR calc Af Amer: 72 mL/min/{1.73_m2} (ref >59)
GFR calc non Af Amer: 62 mL/min/{1.73_m2} (ref >59)
Glucose: 106 mg/dL — ABNORMAL HIGH (ref 65–99)
Potassium: 4.9 mmol/L (ref 3.5–5.2)
Sodium: 137 mmol/L (ref 134–144)

## 2020-09-12 NOTE — Progress Notes (Signed)
Please call patient with update.   Kidney function normal.   Diabetes plan of care discussed in clinic. Reminder to keep appointment for diabetes on 09/20/2020 with [facility].

## 2020-09-15 ENCOUNTER — Ambulatory Visit: Payer: Self-pay | Attending: Family Medicine

## 2020-09-15 ENCOUNTER — Other Ambulatory Visit: Payer: Self-pay

## 2020-09-18 NOTE — Progress Notes (Signed)
S:     PCP: Ricky Stabs, NP PMH: T2DM, HTN, CHF (EF 15-20%), HLD, obesity  Patient arrives in good spirits.  Presents for diabetes evaluation, education, and management. Patient was referred and established care with Primary Care Provider on 09/12/19. At that visit, pt reported inability to afford Jardiance. Reported home BG 110-190 with A1C of 7.6%. Denied hypoglycemia. No medication changes made.  Today, patient reports good adherence to medications. However, due to cost he has not started [place]. Denies side effects with diabetes medications. Pt reports home fasting sugars 120s to 160s. Denies BG<70, but sometimes feels anxious and sweaty (CBG ~130 when this occurs). He reports drinking juice and feeling better. Pt reports nocturia is improving but still experiencing neuropathy. We discussed current diet and exercise (see below). Additionally, pt reports previously on Entresto but stopped taking due to cost.   Family/Social History:  -Fhx: Cancer in his sister; Cerebral aneurysm in his mother; Diabetes in his father and paternal grandmother; Hypertension in his father and paternal grandmother; Stroke in his father and paternal grandmother -Tobacco: Former Smoker (Quit: 10/04/1983) -Alcohol: 6.0 standard drinks of alcohol per week.  -He reports that he does not use drugs  Insurance coverage/medication affordability: [employer]  Medication adherence reported good .   Current diabetes medications include: Jardiance 10 mg daily (not taking due to cost), glipizide 10 mg - 2 tablets daily (20 mg total), metformin 1000 mg BID  Current hypertension medications include: carvedilol 12.5 mg BID, losartan 100 mg daily, spironolactone 25 mg daily, furosemide 40 mg daily Current hyperlipidemia medications include: atorvastatin 20 mg daily  Patient reported dietary habits: Eats 2 meals/day Breakfast: eggs, toast, cream of wheat Lunch: sometimes skips lunch and just has snacks Dinner: meat,  lots of veggies, brown rice Snacks: peanut butter, nuts  Drinks: diet sodas, decaf coffee, green tea, flavored water   Patient-reported exercise habits: "couch potato"   Patient reports nocturia (nighttime urination).  Patient reports neuropathy (nerve pain). Patient reports visual changes. Patient reports self foot exams.     O:  POCT: 138 (fasting)  Lab Results  Component Value Date   HGBA1C 7.6 (A) 09/11/2020   There were no vitals filed for this visit.  Lipid Panel     Component Value Date/Time   CHOL 146 12/20/2019 1536   TRIG 101 12/20/2019 1536   HDL 59 12/20/2019 1536   CHOLHDL 2.5 12/20/2019 1536   CHOLHDL 3.8 09/08/2017 0437   VLDL 16 09/08/2017 0437   LDLCALC 69 12/20/2019 1536    Home fasting blood sugars: 124-168  2 hour post-meal/random blood sugars: none   Clinical Atherosclerotic Cardiovascular Disease (ASCVD): No  The 10-year ASCVD risk score Denman George DC Jr., et al., 2013) is: 22.4%   Values used to calculate the score:     Age: 75 years     Sex: Male     Is Non-Hispanic African American: Yes     Diabetic: Yes     Tobacco smoker: No     Systolic Blood Pressure: 122 mmHg     Is BP treated: Yes     HDL Cholesterol: 59 mg/dL     Total Cholesterol: 146 mg/dL    A/P: Diabetes longstanding currently uncontrolled. Patient is able to verbalize appropriate hypoglycemia management plan. Medication adherence appears good. Control is suboptimal due to need for additional therapy. Patient previously prescribed Jardiance for DM and HF management, however never started due to cost. Will apply for Jardiance patient assistance. Encouraged patient to  aim for a diet full of vegetables, fruit and lean meats (chicken, [place], fish) and to limit carbs (bread, pasta, sugar, rice) and red meat consumption. Encouraged patient to exercise 20-30 minutes daily with the goal of 150 minutes per week. Patient verbalized understanding. - Started Jardiance 10 mg once daily -  Continued metformin 1000 mg BID - Continued glipizide XL 10 mg BID -Extensively discussed pathophysiology of diabetes, recommended lifestyle interventions, dietary effects on blood sugar control -Counseled on s/sx of and management of hypoglycemia -Next A1C anticipated 12/2020.   ASCVD risk - primary prevention in patient with diabetes. Last LDL is controlled. ASCVD risk score is >20%  - moderate intensity statin indicated. Aspirin is indicated.  -continued aspirin 81 mg  -continued atorvastatin 20 mg.   Chronic systolic HF - pt with EF 15-20%. Previously on Entresto 49-51mg  BID however stopped taking due to cost. Therefore, transitioned to losartan. To better optimize GDMT for HF management, will apply for Entresto and Jardiance patient assistance. Also, to reduce risk of over-diuresis with starting Entresto and SGLT-2 inhibitor, instructed patient to take 0.5 tablet of furosemide 40 mg daily, and increase to 1 tablet daily if additional diuresis is needed. Patient verbalized understanding. - Started Entresto 49-51 mg BID - Started Jardiance 10 mg daily - Discontinued losartan 100 mg daily - Decreased furosemide to 20 mg daily with the ability to titrate up to 40 mg daily if needed - Recommend BMET at next visit due to addition of Entresto and Jardiance  Written patient instructions provided.  Total time in face to face counseling 40 minutes.   Follow up PCP Clinic Visit in ~4 weeks.  Adam Phenix, PharmD Student   Fabio Neighbors, PharmD, BCPS PGY2 Ambulatory Care Resident [facility]arm[facility]

## 2020-09-20 ENCOUNTER — Other Ambulatory Visit: Payer: Self-pay

## 2020-09-20 ENCOUNTER — Other Ambulatory Visit: Payer: Self-pay | Admitting: Pharmacist

## 2020-09-20 ENCOUNTER — Ambulatory Visit: Payer: Self-pay | Attending: Family Medicine | Admitting: Pharmacist

## 2020-09-20 DIAGNOSIS — Z794 Long term (current) use of insulin: Secondary | ICD-10-CM

## 2020-09-20 DIAGNOSIS — I5022 Chronic systolic (congestive) heart failure: Secondary | ICD-10-CM

## 2020-09-20 DIAGNOSIS — E119 Type 2 diabetes mellitus without complications: Secondary | ICD-10-CM

## 2020-09-20 LAB — GLUCOSE, POCT (MANUAL RESULT ENTRY): POC Glucose: 138 mg/dl — AB (ref 70–99)

## 2020-09-20 MED ORDER — GLIPIZIDE ER 10 MG PO TB24: 20.0000 mg | ORAL_TABLET | Freq: Every day | ORAL | 2 refills | Status: DC

## 2020-09-20 MED ORDER — SPIRONOLACTONE 25 MG PO TABS: 25.0000 mg | ORAL_TABLET | Freq: Every day | ORAL | 2 refills | Status: DC

## 2020-09-20 MED ORDER — METFORMIN HCL 1000 MG PO TABS: 1000.0000 mg | ORAL_TABLET | Freq: Two times a day (BID) | ORAL | 2 refills | Status: DC

## 2020-09-20 MED ORDER — ENTRESTO 49-51 MG PO TABS: 1.0000 | ORAL_TABLET | Freq: Two times a day (BID) | ORAL | 2 refills | Status: DC

## 2020-09-20 MED ORDER — CARVEDILOL 12.5 MG PO TABS: 12.5000 mg | ORAL_TABLET | Freq: Two times a day (BID) | ORAL | 2 refills | Status: DC

## 2020-09-20 MED ORDER — ATORVASTATIN CALCIUM 20 MG PO TABS: 20.0000 mg | ORAL_TABLET | Freq: Every day | ORAL | 2 refills | Status: DC

## 2020-09-20 MED ORDER — FUROSEMIDE 40 MG PO TABS: 40.0000 mg | ORAL_TABLET | Freq: Every day | ORAL | 2 refills | Status: DC

## 2020-09-20 MED ORDER — LOSARTAN POTASSIUM 100 MG PO TABS: 100.0000 mg | ORAL_TABLET | Freq: Every day | ORAL | 2 refills | Status: DC

## 2020-09-20 MED ORDER — EMPAGLIFLOZIN 10 MG PO TABS: 10.0000 mg | ORAL_TABLET | Freq: Every day | ORAL | 3 refills | Status: DC

## 2020-09-20 MED FILL — glipiZIDE XL 10 MG TB24: 10 | 30 days supply | Qty: 60 | Fill #0

## 2020-09-20 MED FILL — ?ATORVASTATIN 20 MG TABLET: 20 | 30 days supply | Qty: 30 | Fill #0

## 2020-09-20 MED FILL — ?SPIRONOLACTONE 25 MG TABLE: 25 | 30 days supply | Qty: 30 | Fill #0

## 2020-09-20 MED FILL — METFORMIN HCL 1000 MG TABS: 1000 | 30 days supply | Qty: 60 | Fill #0

## 2020-09-20 MED FILL — ENTRESTO 49 MG-51 MG TABLET: 49-51 | 30 days supply | Qty: 60 | Fill #0

## 2020-09-20 MED FILL — ?CARVEDILOL 12.5 MG TABLET: 12.5 | 30 days supply | Qty: 60 | Fill #0

## 2020-09-20 MED FILL — JARDIANCE 10 MG TABLET: 10 | 14 days supply | Qty: 14 | Fill #0

## 2020-09-20 MED FILL — ?FUROSEMIDE 40 MG TABLET: 40 | 30 days supply | Qty: 30 | Fill #0

## 2020-10-02 MED FILL — JARDIANCE 10 MG TABLET: 10 | 30 days supply | Qty: 30 | Fill #1

## 2020-10-19 MED FILL — ENTRESTO 49 MG-51 MG TABLET: 49-51 | 30 days supply | Qty: 60 | Fill #1

## 2020-10-19 MED FILL — METFORMIN HCL 1000 MG TABS: 1000 | 30 days supply | Qty: 60 | Fill #1

## 2020-10-19 MED FILL — ?FUROSEMIDE 40 MG TABLET: 40 | 30 days supply | Qty: 30 | Fill #1

## 2020-10-19 MED FILL — glipiZIDE XL 10 MG TB24: 10 | 30 days supply | Qty: 60 | Fill #1

## 2020-10-19 MED FILL — ?ATORVASTATIN 20 MG TABLET: 20 | 30 days supply | Qty: 30 | Fill #1

## 2020-10-19 MED FILL — ?SPIRONOLACTONE 25 MG TABLE: 25 | 30 days supply | Qty: 30 | Fill #1

## 2020-10-19 MED FILL — ?CARVEDILOL 12.5 MG TABLET: 12.5 | 30 days supply | Qty: 60 | Fill #1

## 2020-10-25 ENCOUNTER — Other Ambulatory Visit: Payer: Self-pay

## 2020-10-25 ENCOUNTER — Ambulatory Visit (INDEPENDENT_AMBULATORY_CARE_PROVIDER_SITE_OTHER): Payer: Self-pay | Admitting: Family

## 2020-10-25 VITALS — BP 122/86 | HR 81 | Ht 67.21 in | Wt 205.8 lb

## 2020-10-25 DIAGNOSIS — Z1159 Encounter for screening for other viral diseases: Secondary | ICD-10-CM

## 2020-10-25 DIAGNOSIS — Z0000 Encounter for general adult medical examination without abnormal findings: Secondary | ICD-10-CM

## 2020-10-25 DIAGNOSIS — E119 Type 2 diabetes mellitus without complications: Secondary | ICD-10-CM

## 2020-10-25 DIAGNOSIS — I10 Essential (primary) hypertension: Secondary | ICD-10-CM

## 2020-10-25 DIAGNOSIS — Z130 Encounter for screening for diseases of the blood and blood-forming organs and certain disorders involving the immune mechanism: Secondary | ICD-10-CM

## 2020-10-25 DIAGNOSIS — Z1329 Encounter for screening for other suspected endocrine disorder: Secondary | ICD-10-CM

## 2020-10-25 DIAGNOSIS — Z13228 Encounter for screening for other metabolic disorders: Secondary | ICD-10-CM

## 2020-10-25 DIAGNOSIS — I509 Heart failure, unspecified: Secondary | ICD-10-CM

## 2020-10-25 DIAGNOSIS — Z13220 Encounter for screening for lipoid disorders: Secondary | ICD-10-CM

## 2020-10-25 NOTE — Progress Notes (Signed)
Annual physical

## 2020-10-25 NOTE — Patient Instructions (Addendum)
Annual physical exam and labs today. Will call with results.   Follow-up in 2 months or sooner if needed for diabetes checkup.  Keep appointments with Cardiology.   Preventive Care 2-65 Years Old, Male Preventive care refers to lifestyle choices and visits with your health care provider that can promote health and wellness. This includes:  A yearly physical exam. This is also called an annual wellness visit.  Regular dental and eye exams.  Immunizations.  Screening for certain conditions.  Healthy lifestyle choices, such as: ? Eating a healthy diet. ? Getting regular exercise. ? Not using drugs or products that contain nicotine and tobacco. ? Limiting alcohol use. What can I expect for my preventive care visit? Physical exam Your health care provider will check your:  Height and weight. These may be used to calculate your BMI (body mass index). BMI is a measurement that tells if you are at a healthy weight.  Heart rate and blood pressure.  Body temperature.  Skin for abnormal spots. Counseling Your health care provider may ask you questions about your:  Past medical problems.  Family's medical history.  Alcohol, tobacco, and drug use.  Emotional well-being.  Home life and relationship well-being.  Sexual activity.  Diet, exercise, and sleep habits.  Work and work Astronomer.  Access to firearms. What immunizations do I need? Vaccines are usually given at various ages, according to a schedule. Your health care provider will recommend vaccines for you based on your age, medical history, and lifestyle or other factors, such as travel or where you work.   What tests do I need? Blood tests  Lipid and cholesterol levels. These may be checked every 5 years, or more often if you are over 7 years old.  Hepatitis C test.  Hepatitis B test. Screening  Lung cancer screening. You may have this screening every year starting at age 65 if you have a 30-pack-year  history of smoking and currently smoke or have quit within the past 15 years.  Prostate cancer screening. Recommendations will vary depending on your family history and other risks.  Genital exam to check for testicular cancer or hernias.  Colorectal cancer screening. ? All adults should have this screening starting at age 41 and continuing until age 67. ? Your health care provider may recommend screening at age 70 if you are at increased risk. ? You will have tests every 1-10 years, depending on your results and the type of screening test.  Diabetes screening. ? This is done by checking your blood sugar (glucose) after you have not eaten for a while (fasting). ? You may have this done every 1-3 years.  STD (sexually transmitted disease) testing, if you are at risk. Follow these instructions at home: Eating and drinking  Eat a diet that includes fresh fruits and vegetables, whole grains, lean protein, and low-fat dairy products.  Take vitamin and mineral supplements as recommended by your health care provider.  Do not drink alcohol if your health care provider tells you not to drink.  If you drink alcohol: ? Limit how much you have to 0-2 drinks a day. ? Be aware of how much alcohol is in your drink. In the [place] one drink equals one 12 oz bottle of beer (355 mL), one 5 oz glass of wine (148 mL), or one 1 oz glass of hard liquor (44 mL).   Lifestyle  Take daily care of your teeth and gums. Brush your teeth every morning and night with fluoride  toothpaste. Floss one time each day.  Stay active. Exercise for at least 30 minutes 5 or more days each week.  Do not use any products that contain nicotine or tobacco, such as cigarettes, e-cigarettes, and chewing tobacco. If you need help quitting, ask your health care provider.  Do not use drugs.  If you are sexually active, practice safe sex. Use a condom or other form of protection to prevent STIs (sexually transmitted  infections).  If told by your health care provider, take low-dose aspirin daily starting at age 76.  Find healthy ways to cope with stress, such as: ? Meditation, yoga, or listening to music. ? Journaling. ? Talking to a trusted person. ? Spending time with friends and family. Safety  Always wear your seat belt while driving or riding in a vehicle.  Do not drive: ? If you have been drinking alcohol. Do not ride with someone who has been drinking. ? When you are tired or distracted. ? While texting.  Wear a helmet and other protective equipment during sports activities.  If you have firearms in your house, make sure you follow all gun safety procedures. What's next?  Go to your health care provider once a year for an annual wellness visit.  Ask your health care provider how often you should have your eyes and teeth checked.  Stay up to date on all vaccines. This information is not intended to replace advice given to you by your health care provider. Make sure you discuss any questions you have with your health care provider. Document Revised: 05/18/2019 Document Reviewed: 08/13/2018 Elsevier Patient Education  2021 [employer].

## 2020-10-25 NOTE — Progress Notes (Signed)
Patient ID: Todd Greer, male    DOB: 1956-07-24  MRN: [id number]  CC: Annual Physical Exam   Subjective: Todd Greer is a 65 y.o. male who presents for annual physical exam.  1. DIABETES TYPE 2 FOLLOW-UP: Visit 09/20/2020 at [facility] per Pharmacist note: - Started Jardiance 10 mg once daily - Continued metformin 1000 mg BID - Continued glipizide XL 10 mg BID -Extensively discussed pathophysiology of diabetes, recommended lifestyle interventions, dietary effects on blood sugar control -Counseled on s/sx of and management of hypoglycemia -Next A1C anticipated 12/2020.   10/25/2020: Reports he was able to get Jardiance filled. Doing well taking medication without side effects.   2. HYPERTENSION FOLLOW-UP: Visit 09/20/2020 at [facility] per Pharmacist note: Chronic systolic HF - pt with EF 15-20%. Previously on Entresto 49-51mg  BID however stopped taking due to cost. Therefore, transitioned to losartan. To better optimize GDMT for HF management, will apply for Entresto and Jardiance patient assistance. Also, to reduce risk of over-diuresis with starting Entresto and SGLT-2 inhibitor, instructed patient to take 0.5 tablet of furosemide 40 mg daily, and increase to 1 tablet daily if additional diuresis is needed. Patient verbalized understanding. - Started Entresto 49-51 mg BID - Started Jardiance 10 mg daily - Discontinued losartan 100 mg daily - Decreased furosemide to 20 mg daily with the ability to titrate up to 40 mg daily if needed - Recommend BMET at next visit due to addition of Entresto and Jardiance  10/25/2020:  Reports was able to get Entresto and Jardiance filled. Doing well taking medication without side effects. Has an appointment scheduled with Cardiology in March 2022.   Patient Active Problem List   Diagnosis Date Noted  . CHF, acute on chronic (HCC) 09/05/2017  . Acute respiratory failure  with hypoxia (HCC) 09/05/2017  . Hypertrophy of prostate without urinary obstruction and other lower urinary tract symptoms (LUTS) 12/03/2013  . Erectile dysfunction 08/20/2013  . Personal history of colonic polyps 07/20/2013  . Exertional dyspnea 06/01/2013    Class: Acute  . Obesity (BMI 30-39.9) 06/01/2013  . Abnormal resting ECG findings - bifascicular block with PVCs 06/01/2013  . DM II (diabetes mellitus, type II), controlled (HCC) 05/28/2013  . DYSLIPIDEMIA 02/11/2007  . ANXIETY DISORDER 02/11/2007  . ALCOHOL USE 02/11/2007  . Essential hypertension 02/11/2007     Current Outpatient Medications on File Prior to Visit  Medication Sig Dispense Refill  . aspirin 81 MG tablet Take 81 mg by mouth daily.    Marland Kitchen atorvastatin (LIPITOR) 20 MG tablet Take 1 tablet (20 mg total) by mouth daily at 6 PM. 30 tablet 2  . Blood Glucose Monitoring Suppl SUPPLIES MISC Use as directed 100 each 1  . carvedilol (COREG) 12.5 MG tablet Take 1 tablet (12.5 mg total) by mouth 2 (two) times daily. 60 tablet 2  . empagliflozin (JARDIANCE) 10 MG TABS tablet Take 1 tablet (10 mg total) by mouth daily before breakfast. 30 tablet 3  . furosemide (LASIX) 40 MG tablet Take 1 tablet (40 mg total) by mouth daily. 30 tablet 2  . glipiZIDE (GLIPIZIDE XL) 10 MG 24 hr tablet Take 2 tablets (20 mg total) by mouth daily. 60 tablet 2  . metFORMIN (GLUCOPHAGE) 1000 MG tablet Take 1 tablet (1,000 mg total) by mouth 2 (two) times daily. 60 tablet 2  . Multiple Vitamin (ONE-A-DAY MENS PO) Take 1 tablet by mouth daily.    . sacubitril-valsartan (ENTRESTO) 49-51  MG Take 1 tablet by mouth 2 (two) times daily. 60 tablet 2  . spironolactone (ALDACTONE) 25 MG tablet Take 1 tablet (25 mg total) by mouth daily. 30 tablet 2  . potassium chloride SA (KLOR-CON) 20 MEQ tablet Take 20 mEq by mouth daily.     No current facility-administered medications on file prior to visit.    Allergies  Allergen Reactions  . Ace Inhibitors Cough     Tolerates ARB  . Amlodipine Other (See Comments)    Fatigue    Social History   Socioeconomic History  . Marital status: Married    Spouse name: Not on file  . Number of children: Not on file  . Years of education: Not on file  . Highest education level: Not on file  Occupational History  . Not on file  Tobacco Use  . Smoking status: Former Smoker    Packs/day: 0.50    Years: 10.00    Pack years: 5.00    Types: Cigarettes    Quit date: 10/04/1983    Years since quitting: 37.0  . Smokeless tobacco: Never Used  Vaping Use  . Vaping Use: Never used  Substance and Sexual Activity  . Alcohol use: Yes    Alcohol/week: 6.0 standard drinks    Types: 6 Standard drinks or equivalent per week  . Drug use: No  . Sexual activity: Yes  Other Topics Concern  . Not on file  Social History Narrative   His married father of 4.  His married for 28 years.  He lives with his wife and daughters.  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  Education: [employer].   He quit smoking in 1985.  He drinks 3 beers a week.   Prior to the onset of his current symptoms, used to work out with weights for at least 40 minutes a day for 3 times a week.  He usually did light weights for many occasions as opposed to heavy weights.      Contacts: Wife Todd Greer; daughter Todd Greer   Social Determinants of Health   Financial Resource Strain: Not on file  Food Insecurity: Not on file  Transportation Needs: Not on file  Physical Activity: Not on file  Stress: Not on file  Social Connections: Not on file  Intimate Partner Violence: Not on file    Family History  Problem Relation Age of Onset  . Stroke Father   . Diabetes Father   . Hypertension Father   . Cancer Sister   . Cerebral aneurysm Mother        Died at a young age.  . Diabetes Paternal Grandmother   . Hypertension Paternal Grandmother   . Stroke Paternal Grandmother     Past Surgical  History:  Procedure Laterality Date  . LEFT HEART CATH AND CORONARY ANGIOGRAPHY N/A 09/09/2017   Procedure: LEFT HEART CATH AND CORONARY ANGIOGRAPHY;  Surgeon: Rinaldo Cloud, MD;  Location: [facility];  Service: Cardiovascular;  Laterality: N/A;  . NM  EXERCISE MYOVIEW LTD  06-2013   No ischemia or infarction; note dated images    ROS: Review of Systems Negative except as stated above  PHYSICAL EXAM: BP 122/86 (BP Location: Left Arm, Patient Position: Sitting)   Pulse 81   Ht 5' 7.21" (1.707 m)   Wt 205 lb 12.8 oz (93.4 kg)   SpO2 97%   BMI 32.04 kg/m    Wt Readings from Last 3 Encounters:  10/25/20 205 lb 12.8 oz (93.4 kg)  09/11/20 216 lb 14.4 oz (98.4 kg)  12/20/19 214 lb 3.2 oz (97.2 kg)    Physical Exam Constitutional:      Appearance: He is obese.  HENT:     Head: Normocephalic and atraumatic.     Right Ear: Tympanic membrane, ear canal and external ear normal.     Left Ear: Tympanic membrane, ear canal and external ear normal.     Nose: Nose normal.     Mouth/Throat:     Mouth: Mucous membranes are moist.     Pharynx: Oropharynx is clear.  Eyes:     Extraocular Movements: Extraocular movements intact.     Conjunctiva/sclera: Conjunctivae normal.     Pupils: Pupils are equal, round, and reactive to light.  Cardiovascular:     Rate and Rhythm: Normal rate and regular rhythm.     Pulses: Normal pulses.     Heart sounds: Normal heart sounds.  Pulmonary:     Effort: Pulmonary effort is normal.     Breath sounds: Normal breath sounds.  Abdominal:     General: Bowel sounds are normal.     Palpations: Abdomen is soft.  Genitourinary:    Comments: Patient declined examination. Musculoskeletal:        General: Normal range of motion.     Cervical back: Normal range of motion and neck supple.  Skin:    General: Skin is warm and dry.     Capillary Refill: Capillary refill takes less than 2 seconds.  Neurological:     General: No focal deficit present.      Mental Status: He is alert and oriented to person, place, and time.  Psychiatric:        Mood and Affect: Mood normal.        Behavior: Behavior normal.        Thought Content: Thought content normal.        Judgment: Judgment normal.     ASSESSMENT AND PLAN: 1. Annual physical exam: - Counseled on 150 minutes of exercise per week as tolerated, healthy eating (including decreased daily intake of saturated fats, cholesterol, added sugars, sodium), STI prevention, and routine healthcare maintenance.  2. Screening for metabolic disorder: - CMP to check kidney function, liver function, and electrolyte balance.  - Comprehensive metabolic panel  3. Screening for deficiency anemia:  - CBC to screen for anemia. - CBC  4. Thyroid disorder screen: - TSH to check thyroid function.  - TSH  5. Essential hypertension: - Blood pressure at goal during today's visit.  - Reports was able to get Entresto and Jardiance filled since last visit with Pharmacist on 09/20/2020. Doing well taking medication without side effects. Has an appointment scheduled with Cardiology in March 2022.  - Counseled on blood pressure goal of less than 130/80, low-sodium, DASH diet, medication compliance, 150 minutes of moderate intensity exercise per week as tolerated. Discussed medication compliance, adverse effects. - Follow-up with primary provider as scheduled.    6. Controlled type 2 diabetes mellitus without complication, without long-term current use of insulin (HCC): - Reports he was able to get Jardiance filled since last visit with Pharmacist on 09/20/2020. Doing well taking medication without side effects.  - Previous Hemoglobin A1C close to goal on 09/11/2020 at 7.6%, goal < 7%. Repeat hemoglobin A1c due April 2022.  - Discussed the importance of healthy eating habits, low-carbohydrate diet, low-sugar diet, regular aerobic exercise (at least 150 minutes a week as tolerated)  and medication compliance to achieve  or maintain control of diabetes.  - Patient plans to schedule diabetic eye examination soon. - Follow-up with primary provider as scheduled.   7. Acute on chronic congestive heart failure, unspecified heart failure type [facility]): - Patient stable in clinic today and without respiratory or cardiac distress.  -  Has an appointment scheduled with Cardiology in March 2022.  - CMP to check kidney function, liver function, and electrolyte balance.  - Comprehensive metabolic panel  8. Screening cholesterol level: - Lipid panel to screen for high cholesterol.  - Lipid Panel   Patient was given the opportunity to ask questions.  Patient verbalized understanding of the plan and was able to repeat key elements of the plan. Patient was given clear instructions to go to Emergency Department or return to medical center if symptoms don't improve, worsen, or new problems develop.The patient verbalized understanding.   Orders Placed This Encounter  Procedures  . Comprehensive metabolic panel  . CBC  . TSH  . Lipid Panel     Requested Prescriptions    No prescriptions requested or ordered in this encounter    Return in about 2 months (around 12/23/2020) for Follow-Up diabetes checkup .  Rema Fendt, NP

## 2020-10-26 LAB — COMPREHENSIVE METABOLIC PANEL
ALT: 78 IU/L — ABNORMAL HIGH (ref 0–44)
AST: 71 IU/L — ABNORMAL HIGH (ref 0–40)
Albumin/Globulin Ratio: 1.3 (ref 1.2–2.2)
Albumin: 4.2 g/dL (ref 3.8–4.8)
Alkaline Phosphatase: 49 IU/L (ref 44–121)
BUN/Creatinine Ratio: 14 (ref 10–24)
BUN: 16 mg/dL (ref 8–27)
Bilirubin Total: 0.5 mg/dL (ref 0.0–1.2)
CO2: 20 mmol/L (ref 20–29)
Calcium: 9.8 mg/dL (ref 8.6–10.2)
Chloride: 98 mmol/L (ref 96–106)
Creatinine, Ser: 1.15 mg/dL (ref 0.76–1.27)
GFR calc Af Amer: 77 mL/min/{1.73_m2} (ref >59)
GFR calc non Af Amer: 67 mL/min/{1.73_m2} (ref >59)
Globulin, Total: 3.3 g/dL (ref 1.5–4.5)
Glucose: 129 mg/dL — ABNORMAL HIGH (ref 65–99)
Potassium: 4.9 mmol/L (ref 3.5–5.2)
Sodium: 134 mmol/L (ref 134–144)
Total Protein: 7.5 g/dL (ref 6.0–8.5)

## 2020-10-26 LAB — LIPID PANEL
Chol/HDL Ratio: 2.7 ratio (ref 0.0–5.0)
Cholesterol, Total: 141 mg/dL (ref 100–199)
HDL: 53 mg/dL (ref >39)
LDL Chol Calc (NIH): 73 mg/dL (ref 0–99)
Triglycerides: 76 mg/dL (ref 0–149)
VLDL Cholesterol Cal: 15 mg/dL (ref 5–40)

## 2020-10-26 LAB — CBC
Hematocrit: 46.7 % (ref 37.5–51.0)
Hemoglobin: 15.3 g/dL (ref 13.0–17.7)
MCH: 28 pg (ref 26.6–33.0)
MCHC: 32.8 g/dL (ref 31.5–35.7)
MCV: 85 fL (ref 79–97)
Platelets: 237 10*3/uL (ref 150–450)
RBC: 5.47 x10E6/uL (ref 4.14–5.80)
RDW: 13.3 % (ref 11.6–15.4)
WBC: 5.3 10*3/uL (ref 3.4–10.8)

## 2020-10-26 LAB — TSH: TSH: 3.36 u[IU]/mL (ref 0.450–4.500)

## 2020-10-26 NOTE — Addendum Note (Signed)
Addended by: Rema Fendt on: 10/26/2020 08:51 PM   Modules accepted: Orders

## 2020-10-26 NOTE — Progress Notes (Signed)
Please call patient with update.   Kidney function normal.   Cholesterol normal.   Thyroid normal.   No anemia.   AST/ALT are both elevated. These are used to check liver function.   Some causes of an elevation of AST/ALT are increased alcohol consumption, viral hepatitis, or an immune deficiency.   As a result of this increased risk a hepatitis panel will need to obtained from the lab to screen for possible viral hepatitis. Please come in soon to have this collected.

## 2020-10-31 MED FILL — JARDIANCE 10 MG TABLET: 10 | 30 days supply | Qty: 30 | Fill #2

## 2020-11-20 MED FILL — ?ATORVASTATIN 20 MG TABLET: 20 | 30 days supply | Qty: 30 | Fill #2

## 2020-11-20 MED FILL — ?SPIRONOLACTONE 25 MG TABLE: 25 | 30 days supply | Qty: 30 | Fill #2

## 2020-11-20 MED FILL — glipiZIDE XL 10 MG TB24: 10 | 30 days supply | Qty: 60 | Fill #2

## 2020-11-20 MED FILL — CARVEDILOL 12.5 MG TABLET: 12.5 | 30 days supply | Qty: 60 | Fill #2

## 2020-11-20 MED FILL — ?METFORMIN HCL 1000 MG TAB: 1000 | 30 days supply | Qty: 60 | Fill #2

## 2020-12-04 ENCOUNTER — Other Ambulatory Visit: Payer: Self-pay

## 2020-12-04 ENCOUNTER — Encounter: Payer: Self-pay | Admitting: Family

## 2020-12-04 DIAGNOSIS — Z794 Long term (current) use of insulin: Secondary | ICD-10-CM

## 2020-12-04 DIAGNOSIS — I5022 Chronic systolic (congestive) heart failure: Secondary | ICD-10-CM

## 2020-12-04 DIAGNOSIS — E1142 Type 2 diabetes mellitus with diabetic polyneuropathy: Secondary | ICD-10-CM

## 2020-12-04 MED ORDER — EMPAGLIFLOZIN 10 MG PO TABS
ORAL_TABLET | Freq: Every day | ORAL | 3 refills | Status: DC
  Filled 2020-12-04: qty 30, 30d supply, fill #0

## 2020-12-04 MED FILL — Empagliflozin Tab 10 MG: ORAL | 30 days supply | Qty: 30 | Fill #0 | Status: CN

## 2020-12-04 NOTE — Progress Notes (Signed)
Jardiance refilled

## 2020-12-05 ENCOUNTER — Other Ambulatory Visit: Payer: Self-pay

## 2020-12-20 ENCOUNTER — Other Ambulatory Visit: Payer: Self-pay | Admitting: Family Medicine

## 2020-12-21 MED ORDER — SPIRONOLACTONE 25 MG PO TABS
25.0000 mg | ORAL_TABLET | Freq: Every day | ORAL | 0 refills | Status: DC
  Filled 2020-12-21: qty 30, 30d supply, fill #0

## 2020-12-21 MED ORDER — CARVEDILOL 12.5 MG PO TABS
12.5000 mg | ORAL_TABLET | Freq: Two times a day (BID) | ORAL | 0 refills | Status: DC
  Filled 2020-12-21: qty 60, 30d supply, fill #0

## 2020-12-21 MED ORDER — METFORMIN HCL 1000 MG PO TABS
1000.0000 mg | ORAL_TABLET | Freq: Two times a day (BID) | ORAL | 0 refills | Status: DC
  Filled 2020-12-21: qty 60, 30d supply, fill #0

## 2020-12-21 MED ORDER — GLIPIZIDE ER 10 MG PO TB24
20.0000 mg | ORAL_TABLET | Freq: Every day | ORAL | 0 refills | Status: DC
  Filled 2020-12-21: qty 60, 30d supply, fill #0

## 2020-12-21 MED ORDER — ATORVASTATIN CALCIUM 20 MG PO TABS
20.0000 mg | ORAL_TABLET | Freq: Every day | ORAL | 0 refills | Status: DC
  Filled 2020-12-21: qty 30, 30d supply, fill #0

## 2020-12-21 NOTE — Telephone Encounter (Signed)
Carvedilol, Atorvastatin, Glipizide, Spironolactone, and Metformin refilled per patient request. Please schedule office visit for additional refills.

## 2020-12-22 ENCOUNTER — Other Ambulatory Visit: Payer: Self-pay

## 2020-12-23 NOTE — Progress Notes (Signed)
Patient ID: Todd Greer, male    DOB: [birth year]  MRN: [id number]  CC: Diabetes Follow-Up  Subjective: Todd Greer is a 65 y.o. male who presents for diabetes follow-up. His concerns today include:   1.DIABETES TYPE 2 FOLLOW-UP: 10/25/2020: - Reports he was able to get Jardiance filled since last visit with Pharmacist on 09/20/2020. Doing well taking medication without side effects.   12/25/2020: Last A1C: 6.3% on 12/25/2020 Med Adherence:  [x]  Yes    []  No Medication side effects:  []  Yes    [x]  No Diet Adherence: [x]  Yes    []  No Exercise: [x]  Yes    []  No Hypoglycemic episodes?: []  Yes    [x]  No Numbness of the feet and fingers? [x]  Yes    []  No Retinopathy hx? []  Yes    [x]  No  2. HEART FAILURE CONCERN: Reports he was taken out of work around the end of 2021 by his Cardiologist related to heart failure concerns. Has not been seen recently neither has an appointment scheduled with Cardiology soon. Reports he has not been medically released from Cardiology. Would like to know if he can have a letter to return to work. Reports he feels overall improved especially after the recent weight loss. Would like to work a part-time job.  3. LIVER FUNCTION TESTING: Would like liver function retested after recent elevated labs. Does not drink alcohol. Does use over-the-counter Acetaminophen sometimes. Reports hepatitis C positive in 2016 and received treatment at that time.   Patient Active Problem List   Diagnosis Date Noted  . CHF, acute on chronic (HCC) 09/05/2017  . Acute respiratory failure with hypoxia (HCC) 09/05/2017  . Hypertrophy of prostate without urinary obstruction and other lower urinary tract symptoms (LUTS) 12/03/2013  . Erectile dysfunction 08/20/2013  . Personal history of colonic polyps 07/20/2013  . Exertional dyspnea 06/01/2013    Class: Acute  . Obesity (BMI 30-39.9) 06/01/2013  . Abnormal resting ECG findings - bifascicular block with PVCs 06/01/2013  . DM II  (diabetes mellitus, type II), controlled (HCC) 05/28/2013  . DYSLIPIDEMIA 02/11/2007  . ANXIETY DISORDER 02/11/2007  . ALCOHOL USE 02/11/2007  . Essential hypertension 02/11/2007     Current Outpatient Medications on File Prior to Visit  Medication Sig Dispense Refill  . aspirin 81 MG tablet Take 81 mg by mouth daily.    2017 atorvastatin (LIPITOR) 20 MG tablet Take 1 tablet (20 mg total) by mouth daily. 30 tablet 0  . Blood Glucose Monitoring Suppl SUPPLIES MISC Use as directed 100 each 1  . carvedilol (COREG) 12.5 MG tablet Take 1 tablet (12.5 mg total) by mouth 2 (two) times daily. 60 tablet 0  . furosemide (LASIX) 40 MG tablet TAKE 1 TABLET (40 MG TOTAL) BY MOUTH DAILY. 30 tablet 2  . Multiple Vitamin (ONE-A-DAY MENS PO) Take 1 tablet by mouth daily.    . potassium chloride SA (KLOR-CON) 20 MEQ tablet Take 20 mEq by mouth daily.    . sacubitril-valsartan (ENTRESTO) 49-51 MG TAKE 1 TABLET BY MOUTH 2 (TWO) TIMES DAILY. 60 tablet 2  . spironolactone (ALDACTONE) 25 MG tablet Take 1 tablet (25 mg total) by mouth daily. 30 tablet 0   No current facility-administered medications on file prior to visit.    Allergies  Allergen Reactions  . Ace Inhibitors Cough    Tolerates ARB  . Amlodipine Other (See Comments)    Fatigue    Social History   Socioeconomic History  . Marital status: Married  Spouse name: Not on file  . Number of children: Not on file  . Years of education: Not on file  . Highest education level: Not on file  Occupational History  . Not on file  Tobacco Use  . Smoking status: Former Smoker    Packs/day: 0.50    Years: 10.00    Pack years: 5.00    Types: Cigarettes    Quit date: 10/04/1983    Years since quitting: 37.2  . Smokeless tobacco: Never Used  Vaping Use  . Vaping Use: Never used  Substance and Sexual Activity  . Alcohol use: Yes    Alcohol/week: 6.0 standard drinks    Types: 6 Standard drinks or equivalent per week  . Drug use: No  . Sexual  activity: Yes  Other Topics Concern  . Not on file  Social History Narrative   His married father of 4.  His married for 28 years.  He lives with his wife and daughters.  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  Education: [employer].   He quit smoking in 1985.  He drinks 3 beers a week.   Prior to the onset of his current symptoms, used to work out with weights for at least 40 minutes a day for 3 times a week.  He usually did light weights for many occasions as opposed to heavy weights.      Contacts: Wife [name]; daughter [name]   Social Determinants of Health   Financial Resource Strain: Not on file  Food Insecurity: Not on file  Transportation Needs: Not on file  Physical Activity: Not on file  Stress: Not on file  Social Connections: Not on file  Intimate Partner Violence: Not on file    Family History  Problem Relation Age of Onset  . Stroke Father   . Diabetes Father   . Hypertension Father   . Cancer Sister   . Cerebral aneurysm Mother        Died at a young age.  . Diabetes Paternal Grandmother   . Hypertension Paternal Grandmother   . Stroke Paternal Grandmother     Past Surgical History:  Procedure Laterality Date  . LEFT HEART CATH AND CORONARY ANGIOGRAPHY N/A 09/09/2017   Procedure: LEFT HEART CATH AND CORONARY ANGIOGRAPHY;  Surgeon: Rinaldo Cloud, MD;  Location: [facility];  Service: Cardiovascular;  Laterality: N/A;  . NM  EXERCISE MYOVIEW LTD  06-2013   No ischemia or infarction; note dated images    ROS: Review of Systems Negative except as stated above  PHYSICAL EXAM: BP 120/85 (BP Location: Left Arm, Patient Position: Sitting)   Pulse 71   Ht 5' 7.21" (1.707 m)   Wt 198 lb 6.4 oz (90 kg)   SpO2 98%   BMI 30.88 kg/m   Wt Readings from Last 3 Encounters:  12/25/20 198 lb 6.4 oz (90 kg)  10/25/20 205 lb 12.8 oz (93.4 kg)  09/11/20 216 lb 14.4 oz (98.4 kg)    Physical Exam HENT:      Head: Normocephalic and atraumatic.  Eyes:     Extraocular Movements: Extraocular movements intact.     Conjunctiva/sclera: Conjunctivae normal.     Pupils: Pupils are equal, round, and reactive to light.  Cardiovascular:     Rate and Rhythm: Normal rate and regular rhythm.     Pulses: Normal pulses.     Heart sounds: Normal heart sounds.  Pulmonary:  Effort: Pulmonary effort is normal.     Breath sounds: Normal breath sounds.  Musculoskeletal:     Cervical back: Normal range of motion and neck supple.  Neurological:     General: No focal deficit present.     Mental Status: He is alert and oriented to person, place, and time.  Psychiatric:        Mood and Affect: Mood normal.        Behavior: Behavior normal.    ASSESSMENT AND PLAN: 1. Type 2 diabetes mellitus with diabetic polyneuropathy, without long-term current use of insulin (HCC): - Hemoglobin A1c at goal today at 6.3%, goal < 7%. This is improved from previous hemoglobin A1c of 7.6% on 09/11/2020. Next hemoglobin A1c due July 2022. - Continue Metformin, Glipizide, and Empagliflozin as prescribed.  - Begin Gabapentin as prescribed for polyneuropathy.  - Discussed the importance of healthy eating habits, low-carbohydrate diet, low-sugar diet, regular aerobic exercise (at least 150 minutes a week as tolerated) and medication compliance to achieve or maintain control of diabetes. - BMP to evaluate kidney function and electrolyte balance. - Follow-up with primary provider in 3 months or sooner if needed.  - POCT glycosylated hemoglobin (Hb A1C) - Basic Metabolic Panel - empagliflozin (JARDIANCE) 10 MG TABS tablet; TAKE 1 TABLET (10 MG TOTAL) BY MOUTH DAILY BEFORE BREAKFAST.  Dispense: 90 tablet; Refill: 0 - glipiZIDE (GLIPIZIDE XL) 10 MG 24 hr tablet; Take 2 tablets (20 mg total) by mouth daily.  Dispense: 60 tablet; Refill: 2 - metFORMIN (GLUCOPHAGE) 1000 MG tablet; Take 1 tablet (1,000 mg total) by mouth 2 (two) times  daily with a meal.  Dispense: 60 tablet; Refill: 2 - gabapentin (NEURONTIN) 100 MG capsule; Take 1 capsule (100 mg total) by mouth at bedtime.  Dispense: 30 capsule; Refill: 0  2. Chronic systolic heart failure (HCC): - Reports he was taken out of work around the end of 2021 by his Cardiologist related to heart failure concerns. Has not been seen recently neither has an appointment scheduled with Cardiology soon. Reports he has not been medically released from Cardiology. Would like to know if he can have a letter to return to work. Reports he feels overall improved especially after the recent weight loss. Would like to work a part-time job. - Counseled to follow-up with Cardiology for evaluation, recommendations, and medical release. Patient verbalized understanding.   3. Elevated liver function tests: - Would like liver function retested after recent elevated labs. Does not drink alcohol. Does use over-the-counter Acetaminophen sometimes. Reports hepatitis C positive in 2016 and received treatment at that time. - Hepatic function panel to screen liver function. - Hepatitis C antibody to screen for hepatitis C.  - Hepatic Function Panel - Hepatitis C Antibody    Patient was given the opportunity to ask questions.  Patient verbalized understanding of the plan and was able to repeat key elements of the plan. Patient was given clear instructions to go to Emergency Department or return to medical center if symptoms don't improve, worsen, or new problems develop.The patient verbalized understanding.   Orders Placed This Encounter  Procedures  . Basic Metabolic Panel  . Hepatic Function Panel  . Hepatitis C Antibody  . POCT glycosylated hemoglobin (Hb A1C)     Requested Prescriptions   Signed Prescriptions Disp Refills  . empagliflozin (JARDIANCE) 10 MG TABS tablet 90 tablet 0    Sig: TAKE 1 TABLET (10 MG TOTAL) BY MOUTH DAILY BEFORE BREAKFAST.  Marland Kitchen glipiZIDE (GLIPIZIDE XL) 10 MG  24 hr  tablet 60 tablet 2    Sig: Take 2 tablets (20 mg total) by mouth daily.  . metFORMIN (GLUCOPHAGE) 1000 MG tablet 60 tablet 2    Sig: Take 1 tablet (1,000 mg total) by mouth 2 (two) times daily with a meal.  . gabapentin (NEURONTIN) 100 MG capsule 30 capsule 0    Sig: Take 1 capsule (100 mg total) by mouth at bedtime.    Follow-up with primary provider in 3 months or sooner if needed.   Rema Fendt, NP

## 2020-12-25 ENCOUNTER — Other Ambulatory Visit: Payer: Self-pay

## 2020-12-25 ENCOUNTER — Ambulatory Visit (INDEPENDENT_AMBULATORY_CARE_PROVIDER_SITE_OTHER): Payer: Self-pay | Admitting: Family

## 2020-12-25 ENCOUNTER — Encounter: Payer: Self-pay | Admitting: Family

## 2020-12-25 VITALS — BP 120/85 | HR 71 | Ht 67.21 in | Wt 198.4 lb

## 2020-12-25 DIAGNOSIS — E119 Type 2 diabetes mellitus without complications: Secondary | ICD-10-CM

## 2020-12-25 DIAGNOSIS — E1142 Type 2 diabetes mellitus with diabetic polyneuropathy: Secondary | ICD-10-CM

## 2020-12-25 DIAGNOSIS — I5022 Chronic systolic (congestive) heart failure: Secondary | ICD-10-CM

## 2020-12-25 DIAGNOSIS — R7989 Other specified abnormal findings of blood chemistry: Secondary | ICD-10-CM

## 2020-12-25 LAB — POCT GLYCOSYLATED HEMOGLOBIN (HGB A1C): Hemoglobin A1C: 6.3 % — AB (ref 4.0–5.6)

## 2020-12-25 MED ORDER — EMPAGLIFLOZIN 10 MG PO TABS
ORAL_TABLET | Freq: Every day | ORAL | 0 refills | Status: DC
  Filled 2020-12-25: qty 90, fill #0
  Filled 2021-01-02: qty 30, 30d supply, fill #0
  Filled 2021-01-17: qty 30, 30d supply, fill #1
  Filled 2021-02-20: qty 30, 30d supply, fill #2

## 2020-12-25 MED ORDER — METFORMIN HCL 1000 MG PO TABS
1000.0000 mg | ORAL_TABLET | Freq: Two times a day (BID) | ORAL | 2 refills | Status: DC
  Filled 2020-12-25: qty 60, 30d supply, fill #0
  Filled 2021-01-17: qty 60, 30d supply, fill #1
  Filled 2021-02-20: qty 60, 30d supply, fill #2

## 2020-12-25 MED ORDER — GABAPENTIN 100 MG PO CAPS
100.0000 mg | ORAL_CAPSULE | Freq: Every day | ORAL | 0 refills | Status: DC
  Filled 2020-12-25: qty 30, 30d supply, fill #0

## 2020-12-25 MED ORDER — GLIPIZIDE ER 10 MG PO TB24
20.0000 mg | ORAL_TABLET | Freq: Every day | ORAL | 2 refills | Status: DC
  Filled 2020-12-25: qty 60, 30d supply, fill #0
  Filled 2021-01-17: qty 60, 30d supply, fill #1
  Filled 2021-02-20: qty 60, 30d supply, fill #2

## 2020-12-25 NOTE — Progress Notes (Signed)
Diabetes f/u  

## 2020-12-26 ENCOUNTER — Other Ambulatory Visit: Payer: Self-pay | Admitting: Family

## 2020-12-26 ENCOUNTER — Other Ambulatory Visit: Payer: Self-pay

## 2020-12-26 DIAGNOSIS — Z1159 Encounter for screening for other viral diseases: Secondary | ICD-10-CM

## 2020-12-26 LAB — BASIC METABOLIC PANEL
BUN/Creatinine Ratio: 17 (ref 10–24)
BUN: 17 mg/dL (ref 8–27)
CO2: 19 mmol/L — ABNORMAL LOW (ref 20–29)
Calcium: 9.4 mg/dL (ref 8.6–10.2)
Chloride: 103 mmol/L (ref 96–106)
Creatinine, Ser: 0.99 mg/dL (ref 0.76–1.27)
Glucose: 92 mg/dL (ref 65–99)
Potassium: 4.8 mmol/L (ref 3.5–5.2)
Sodium: 139 mmol/L (ref 134–144)
eGFR: 85 mL/min/{1.73_m2} (ref >59)

## 2020-12-26 LAB — HEPATIC FUNCTION PANEL
ALT: 61 IU/L — ABNORMAL HIGH (ref 0–44)
AST: 57 IU/L — ABNORMAL HIGH (ref 0–40)
Albumin: 4.1 g/dL (ref 3.8–4.8)
Alkaline Phosphatase: 56 IU/L (ref 44–121)
Bilirubin Total: 0.4 mg/dL (ref 0.0–1.2)
Bilirubin, Direct: 0.2 mg/dL (ref 0.00–0.40)
Total Protein: 7.3 g/dL (ref 6.0–8.5)

## 2020-12-26 LAB — HEPATITIS C ANTIBODY: Hep C Virus Ab: >11 s/co ratio — ABNORMAL HIGH (ref 0.0–0.9)

## 2020-12-26 NOTE — Progress Notes (Signed)
Kidney function normal.   Liver function trending down but concern for remaining elevated. Referral to Hepatology for further evaluation. Please discuss with patient options for [facility] Financial Assistance/financial resources.  Hepatitis C antibody positive. Patient also has history of hepatitis C.   Per protocol I have added an additional Hepatitis C lab to screen for possible current active hepatitis C. Please schedule lab only visit to have collected.   Diabetes discussed in office.

## 2021-01-02 ENCOUNTER — Other Ambulatory Visit: Payer: Self-pay

## 2021-01-17 ENCOUNTER — Other Ambulatory Visit: Payer: Self-pay | Admitting: Family

## 2021-01-17 ENCOUNTER — Other Ambulatory Visit: Payer: Self-pay

## 2021-01-17 MED FILL — Furosemide Tab 40 MG: ORAL | 30 days supply | Qty: 30 | Fill #0 | Status: AC

## 2021-01-17 MED FILL — Sacubitril-Valsartan Tab 49-51 MG: ORAL | 30 days supply | Qty: 60 | Fill #0 | Status: AC

## 2021-01-19 ENCOUNTER — Other Ambulatory Visit: Payer: Self-pay

## 2021-01-19 MED ORDER — SPIRONOLACTONE 25 MG PO TABS
25.0000 mg | ORAL_TABLET | Freq: Every day | ORAL | 0 refills | Status: DC
  Filled 2021-01-19: qty 30, 30d supply, fill #0

## 2021-01-19 MED ORDER — ATORVASTATIN CALCIUM 20 MG PO TABS
20.0000 mg | ORAL_TABLET | Freq: Every day | ORAL | 0 refills | Status: DC
  Filled 2021-01-19: qty 30, 30d supply, fill #0

## 2021-01-19 MED ORDER — CARVEDILOL 12.5 MG PO TABS
12.5000 mg | ORAL_TABLET | Freq: Two times a day (BID) | ORAL | 0 refills | Status: DC
  Filled 2021-01-19: qty 60, 30d supply, fill #0

## 2021-02-20 ENCOUNTER — Other Ambulatory Visit: Payer: Self-pay

## 2021-02-20 ENCOUNTER — Other Ambulatory Visit: Payer: Self-pay | Admitting: Family

## 2021-02-22 ENCOUNTER — Other Ambulatory Visit: Payer: Self-pay

## 2021-02-22 MED ORDER — ATORVASTATIN CALCIUM 20 MG PO TABS
20.0000 mg | ORAL_TABLET | Freq: Every day | ORAL | 0 refills | Status: DC
  Filled 2021-02-22: qty 30, 30d supply, fill #0

## 2021-02-22 MED ORDER — CARVEDILOL 12.5 MG PO TABS
12.5000 mg | ORAL_TABLET | Freq: Two times a day (BID) | ORAL | 0 refills | Status: DC
  Filled 2021-02-22: qty 60, 30d supply, fill #0

## 2021-02-22 MED ORDER — SPIRONOLACTONE 25 MG PO TABS
25.0000 mg | ORAL_TABLET | Freq: Every day | ORAL | 0 refills | Status: DC
  Filled 2021-02-22: qty 30, 30d supply, fill #0

## 2021-02-23 ENCOUNTER — Other Ambulatory Visit: Payer: Self-pay

## 2021-03-26 ENCOUNTER — Ambulatory Visit: Payer: Self-pay | Admitting: Family

## 2021-03-28 ENCOUNTER — Other Ambulatory Visit: Payer: Self-pay | Admitting: Family

## 2021-03-28 DIAGNOSIS — E1142 Type 2 diabetes mellitus with diabetic polyneuropathy: Secondary | ICD-10-CM

## 2021-03-29 ENCOUNTER — Other Ambulatory Visit: Payer: Self-pay

## 2021-03-29 ENCOUNTER — Ambulatory Visit (INDEPENDENT_AMBULATORY_CARE_PROVIDER_SITE_OTHER): Payer: Self-pay | Admitting: Family

## 2021-03-29 ENCOUNTER — Encounter: Payer: Self-pay | Admitting: Family

## 2021-03-29 VITALS — BP 115/80 | HR 75 | Temp 98.1°F | Resp 18 | Ht 67.21 in | Wt 201.8 lb

## 2021-03-29 DIAGNOSIS — E1142 Type 2 diabetes mellitus with diabetic polyneuropathy: Secondary | ICD-10-CM

## 2021-03-29 DIAGNOSIS — I5022 Chronic systolic (congestive) heart failure: Secondary | ICD-10-CM

## 2021-03-29 DIAGNOSIS — I10 Essential (primary) hypertension: Secondary | ICD-10-CM

## 2021-03-29 LAB — POCT GLYCOSYLATED HEMOGLOBIN (HGB A1C): Hemoglobin A1C: 6.6 % — AB (ref 4.0–5.6)

## 2021-03-29 LAB — GLUCOSE, POCT (MANUAL RESULT ENTRY): POC Glucose: 112 mg/dl — AB (ref 70–99)

## 2021-03-29 MED ORDER — GABAPENTIN 100 MG PO CAPS
100.0000 mg | ORAL_CAPSULE | Freq: Every day | ORAL | 0 refills | Status: DC
  Filled 2021-03-29: qty 30, 30d supply, fill #0

## 2021-03-29 NOTE — Progress Notes (Signed)
Todd Greer, is a 65 y.o. male  DGU:[id number]  ZDG:[id number]  DOB - Apr 04, 1956  Subjective:  Chief Complaint and HPI: Todd Greer is a 65 y.o. male who presents to the clinic this afternoon for follow-up for his diabetes.  Check blood sugars twice a day and they have been ranging between 67 to 168 mg/dL.  Denies chest pain, shortness of breath, fever, chills, polyuria, polyphagia, or polydipsia.  Patient is due for eye exam.  He is getting medicare health insurance starting next year.  We will postpone all his labs until next year, no health insurance currently.  ED/Hospital notes reviewed.    ROS:   Constitutional:  No f/c, No night sweats, No unexplained weight loss. EENT:  No vision changes, No blurry vision, No hearing changes. No mouth, throat, or ear problems.  Respiratory: No cough, No SOB Cardiac: No CP, no palpitations GI:  No abd pain, No N/V/D. GU: No Urinary s/sx Musculoskeletal: No joint pain Neuro: No headache, no dizziness, no motor weakness.  Endocrine:  No polydipsia. No polyuria.  Psych: Denies SI/HI  No problems updated.  ALLERGIES: Allergies  Allergen Reactions   Ace Inhibitors Cough    Tolerates ARB   Amlodipine Other (See Comments)    Fatigue    PAST MEDICAL HISTORY: Past Medical History:  Diagnosis Date   Anxiety    Diabetes mellitus type II, controlled, with no complications (HCC)    Hepatitis C, chronic (HCC)    Hypertension    Obesity, Class II, BMI 35-39.9, with comorbidity    Obesity, diabetes, and hypertension syndrome (HCC)    Substance abuse (HCC)     MEDICATIONS AT HOME: Prior to Admission medications   Medication Sig Start Date End Date Taking? Authorizing Provider  aspirin 81 MG tablet Take 81 mg by mouth daily.    [provider]  atorvastatin (LIPITOR) 20 MG tablet Take 1 tablet (20 mg total) by mouth daily. 02/22/21 03/25/21  Rema Fendt, NP  Blood Glucose Monitoring Suppl SUPPLIES MISC Use as directed 08/20/13    Maurice March, MD  carvedilol (COREG) 12.5 MG tablet Take 1 tablet (12.5 mg total) by mouth 2 (two) times daily. 02/22/21 03/25/21  Rema Fendt, NP  empagliflozin (JARDIANCE) 10 MG TABS tablet TAKE 1 TABLET (10 MG TOTAL) BY MOUTH DAILY BEFORE BREAKFAST. 12/25/20 12/25/21  Rema Fendt, NP  furosemide (LASIX) 40 MG tablet TAKE 1 TABLET (40 MG TOTAL) BY MOUTH DAILY. 09/20/20 09/20/21  Hoy Register, MD  gabapentin (NEURONTIN) 100 MG capsule Take 1 capsule (100 mg total) by mouth at bedtime. 12/25/20   Rema Fendt, NP  glipiZIDE (GLIPIZIDE XL) 10 MG 24 hr tablet Take 2 tablets (20 mg total) by mouth daily. 12/25/20 03/25/21  Rema Fendt, NP  metFORMIN (GLUCOPHAGE) 1000 MG tablet Take 1 tablet (1,000 mg total) by mouth 2 (two) times daily with a meal. 12/25/20 03/25/21  Rema Fendt, NP  Multiple Vitamin (ONE-A-DAY MENS PO) Take 1 tablet by mouth daily.    [provider]  potassium chloride SA (KLOR-CON) 20 MEQ tablet Take 20 mEq by mouth daily. 10/16/20   [provider]  sacubitril-valsartan (ENTRESTO) 49-51 MG TAKE 1 TABLET BY MOUTH 2 (TWO) TIMES DAILY. 09/20/20 09/20/21  Hoy Register, MD  spironolactone (ALDACTONE) 25 MG tablet Take 1 tablet (25 mg total) by mouth daily. 02/22/21 03/25/21  Rema Fendt, NP     Objective:  EXAM:   Vitals:   03/29/21 1357  BP: 115/80  Pulse: 75  Resp: 18  Temp: 98.1 F (36.7 C)  SpO2: 97%  Weight: 201 lb 12.8 oz (91.5 kg)  Height: 5' 7.21" (1.707 m)    General appearance : A&OX3. NAD. Non-toxic-appearing HEENT: Atraumatic and Normocephalic.  PERRLA. EOM intact.  TM clear B. Mouth-MMM, post pharynx WNL w/o erythema, No PND. Neck: supple, no JVD. No cervical lymphadenopathy. No thyromegaly Chest/Lungs:  Breathing-non-labored, Good air entry bilaterally, breath sounds normal without rales, rhonchi, or wheezing  CVS: S1 S2 regular, no murmurs, gallops, rubs  Abdomen: Bowel sounds present, Non tender and not distended  with no gaurding, rigidity or rebound. Neurology:  CN II-XII grossly intact, Non focal.   Psych:  TP linear. J/I WNL. Normal speech. Appropriate eye contact and affect.   Data Review Lab Results  Component Value Date   HGBA1C 6.3 (A) 12/25/2020   HGBA1C 7.6 (A) 09/11/2020   HGBA1C 12.2 (A) 12/20/2019     Assessment & Plan   1. Type 2 diabetes mellitus with diabetic polyneuropathy, without long-term current use of insulin (HCC) -Continue monitoring blood sugars twice a day -Report blood sugar less than 80 or greater than 249 mg/dL to the clinic or local emergency room - POCT glycosylated hemoglobin (Hb A1C) - POCT glucose (manual entry) -Schedule for eye exam -Monofilament test during next visit  2. Chronic systolic heart failure (HCC) -Take medications as prescribed -Hold any new symptoms to the clinic or emergency room 3. Essential hypertension -Take medications as prescribed -Take active and eat plant based diet     Patient have been counseled extensively about nutrition and exercise  No follow-ups on file.  The patient was given clear instructions to go to ER or return to medical center if symptoms don't improve, worsen or new problems develop. The patient verbalized understanding. The patient was told to call to get lab results if they haven't heard anything in the next week.     Eleonore Chiquito, APRN, FNP-C [facility] and [facility] [place], [place] [phone]   03/29/2021, 2:36 PM

## 2021-03-29 NOTE — Progress Notes (Signed)
Pt presents for diabetes follow-up no other concerns

## 2021-03-29 NOTE — Patient Instructions (Signed)
  1. Type 2 diabetes mellitus with diabetic polyneuropathy, without long-term current use of insulin (HCC) -Continue monitoring blood sugars twice a day -Report blood sugar less than 80 or greater than 249 mg/dL to the clinic or local emergency room - POCT glycosylated hemoglobin (Hb A1C) - POCT glucose (manual entry) -Schedule for eye exam -Monofilament test during next visit  2. Chronic systolic heart failure (HCC) -Take medications as prescribed -Hold any new symptoms to the clinic or emergency room 3. Essential hypertension -Take medications as prescribed -Take active and eat plant based diet     Patient have been counseled extensively about nutrition and exercise  No follow-ups on file.  The patient was given clear instructions to go to ER or return to medical center if symptoms don't improve, worsen or new problems develop. The patient verbalized understanding. The patient was told to call to get lab results if they haven't heard anything in the next week.

## 2021-03-30 ENCOUNTER — Other Ambulatory Visit: Payer: Self-pay | Admitting: Family

## 2021-03-30 ENCOUNTER — Other Ambulatory Visit: Payer: Self-pay

## 2021-03-30 DIAGNOSIS — E1142 Type 2 diabetes mellitus with diabetic polyneuropathy: Secondary | ICD-10-CM

## 2021-03-30 MED ORDER — METFORMIN HCL 1000 MG PO TABS
1000.0000 mg | ORAL_TABLET | Freq: Two times a day (BID) | ORAL | 2 refills | Status: DC
Start: 2021-03-30 — End: 2021-06-08
  Filled 2021-03-30: qty 60, 30d supply, fill #0
  Filled 2021-04-25: qty 60, 30d supply, fill #1
  Filled 2021-05-26: qty 60, 30d supply, fill #2

## 2021-03-30 MED ORDER — GLIPIZIDE ER 10 MG PO TB24
20.0000 mg | ORAL_TABLET | Freq: Every day | ORAL | 2 refills | Status: DC
Start: 2021-03-30 — End: 2021-06-08
  Filled 2021-03-30: qty 60, 30d supply, fill #0
  Filled 2021-04-25: qty 60, 30d supply, fill #1
  Filled 2021-05-26: qty 60, 30d supply, fill #2

## 2021-03-30 MED ORDER — SPIRONOLACTONE 25 MG PO TABS
25.0000 mg | ORAL_TABLET | Freq: Every day | ORAL | 0 refills | Status: DC
  Filled 2021-03-30: qty 30, 30d supply, fill #0

## 2021-03-30 MED ORDER — CARVEDILOL 12.5 MG PO TABS
12.5000 mg | ORAL_TABLET | Freq: Two times a day (BID) | ORAL | 0 refills | Status: DC
  Filled 2021-03-30: qty 60, 30d supply, fill #0

## 2021-03-30 MED ORDER — EMPAGLIFLOZIN 10 MG PO TABS
ORAL_TABLET | Freq: Every day | ORAL | 0 refills | Status: DC
  Filled 2021-03-30: qty 30, 30d supply, fill #0
  Filled 2021-04-25: qty 30, 30d supply, fill #1
  Filled 2021-05-26: qty 30, 30d supply, fill #2

## 2021-03-30 MED ORDER — ATORVASTATIN CALCIUM 20 MG PO TABS
20.0000 mg | ORAL_TABLET | Freq: Every day | ORAL | 0 refills | Status: DC
  Filled 2021-03-30: qty 30, 30d supply, fill #0

## 2021-04-02 ENCOUNTER — Other Ambulatory Visit: Payer: Self-pay

## 2021-04-03 ENCOUNTER — Other Ambulatory Visit: Payer: Self-pay

## 2021-04-20 ENCOUNTER — Other Ambulatory Visit: Payer: Self-pay

## 2021-04-25 ENCOUNTER — Other Ambulatory Visit: Payer: Self-pay | Admitting: Family

## 2021-04-25 ENCOUNTER — Other Ambulatory Visit: Payer: Self-pay

## 2021-04-25 MED ORDER — ATORVASTATIN CALCIUM 20 MG PO TABS
20.0000 mg | ORAL_TABLET | Freq: Every day | ORAL | 0 refills | Status: DC
  Filled 2021-04-25: qty 30, 30d supply, fill #0

## 2021-04-25 MED ORDER — SPIRONOLACTONE 25 MG PO TABS
25.0000 mg | ORAL_TABLET | Freq: Every day | ORAL | 0 refills | Status: DC
  Filled 2021-04-25: qty 30, 30d supply, fill #0

## 2021-04-25 MED ORDER — CARVEDILOL 12.5 MG PO TABS
12.5000 mg | ORAL_TABLET | Freq: Two times a day (BID) | ORAL | 0 refills | Status: DC
  Filled 2021-04-25: qty 60, 30d supply, fill #0

## 2021-04-26 ENCOUNTER — Other Ambulatory Visit: Payer: Self-pay

## 2021-05-06 NOTE — Progress Notes (Deleted)
Patient ID: Todd Greer, male    DOB: 10-23-1955  MRN: [id number]  CC: No chief complaint on file.   Subjective: Todd Greer is a 65 y.o. male who presents for His concerns today include: ***  Patient Active Problem List   Diagnosis Date Noted   CHF, acute on chronic (HCC) 09/05/2017   Acute respiratory failure with hypoxia (HCC) 09/05/2017   Hypertrophy of prostate without urinary obstruction and other lower urinary tract symptoms (LUTS) 12/03/2013   Erectile dysfunction 08/20/2013   Personal history of colonic polyps 07/20/2013   Exertional dyspnea 06/01/2013    Class: Acute   Obesity (BMI 30-39.9) 06/01/2013   Abnormal resting ECG findings - bifascicular block with PVCs 06/01/2013   DM II (diabetes mellitus, type II), controlled (HCC) 05/28/2013   DYSLIPIDEMIA 02/11/2007   ANXIETY DISORDER 02/11/2007   ALCOHOL USE 02/11/2007   Essential hypertension 02/11/2007     Current Outpatient Medications on File Prior to Visit  Medication Sig Dispense Refill   aspirin 81 MG tablet Take 81 mg by mouth daily.     atorvastatin (LIPITOR) 20 MG tablet Take 1 tablet (20 mg total) by mouth daily. 30 tablet 0   Blood Glucose Monitoring Suppl SUPPLIES MISC Use as directed 100 each 1   carvedilol (COREG) 12.5 MG tablet Take 1 tablet (12.5 mg total) by mouth 2 (two) times daily. 60 tablet 0   empagliflozin (JARDIANCE) 10 MG TABS tablet TAKE 1 TABLET (10 MG TOTAL) BY MOUTH DAILY BEFORE BREAKFAST. 90 tablet 0   furosemide (LASIX) 40 MG tablet TAKE 1 TABLET (40 MG TOTAL) BY MOUTH DAILY. 30 tablet 2   gabapentin (NEURONTIN) 100 MG capsule Take 1 capsule (100 mg total) by mouth at bedtime. 30 capsule 0   glipiZIDE (GLIPIZIDE XL) 10 MG 24 hr tablet Take 2 tablets (20 mg total) by mouth daily. 60 tablet 2   metFORMIN (GLUCOPHAGE) 1000 MG tablet Take 1 tablet (1,000 mg total) by mouth 2 (two) times daily with a meal. 60 tablet 2   Multiple Vitamin (ONE-A-DAY MENS PO) Take 1 tablet by mouth daily.      potassium chloride SA (KLOR-CON) 20 MEQ tablet Take 20 mEq by mouth daily.     sacubitril-valsartan (ENTRESTO) 49-51 MG TAKE 1 TABLET BY MOUTH 2 (TWO) TIMES DAILY. 60 tablet 2   spironolactone (ALDACTONE) 25 MG tablet Take 1 tablet (25 mg total) by mouth daily. 30 tablet 0   No current facility-administered medications on file prior to visit.    Allergies  Allergen Reactions   Ace Inhibitors Cough    Tolerates ARB   Amlodipine Other (See Comments)    Fatigue    Social History   Socioeconomic History   Marital status: Married    Spouse name: Not on file   Number of children: Not on file   Years of education: Not on file   Highest education level: Not on file  Occupational History   Not on file  Tobacco Use   Smoking status: Former    Packs/day: 0.50    Years: 10.00    Pack years: 5.00    Types: Cigarettes    Quit date: 10/04/1983    Years since quitting: 37.6   Smokeless tobacco: Never  Vaping Use   Vaping Use: Never used  Substance and Sexual Activity   Alcohol use: Yes    Alcohol/week: 6.0 standard drinks    Types: 6 Standard drinks or equivalent per week   Drug use: No  Sexual activity: Yes  Other Topics Concern   Not on file  Social History Narrative   His married father of 4.  His married for 28 years.  He lives with his wife and daughters.  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  Education: [employer].   He quit smoking in 1985.  He drinks 3 beers a week.   Prior to the onset of his current symptoms, used to work out with weights for at least 40 minutes a day for 3 times a week.  He usually did light weights for many occasions as opposed to heavy weights.      Contacts: Wife [name]; daughter [name]   Social Determinants of Health   Financial Resource Strain: Not on file  Food Insecurity: Not on file  Transportation Needs: Not on file  Physical Activity: Not on file  Stress: Not on file  Social  Connections: Not on file  Intimate Partner Violence: Not on file    Family History  Problem Relation Age of Onset   Stroke Father    Diabetes Father    Hypertension Father    Cancer Sister    Cerebral aneurysm Mother        Died at a young age.   Diabetes Paternal Grandmother    Hypertension Paternal Grandmother    Stroke Paternal Grandmother     Past Surgical History:  Procedure Laterality Date   LEFT HEART CATH AND CORONARY ANGIOGRAPHY N/A 09/09/2017   Procedure: LEFT HEART CATH AND CORONARY ANGIOGRAPHY;  Surgeon: Rinaldo Cloud, MD;  Location: [facility];  Service: Cardiovascular;  Laterality: N/A;   NM  EXERCISE MYOVIEW LTD  06-2013   No ischemia or infarction; note dated images    ROS: Review of Systems Negative except as stated above  PHYSICAL EXAM: There were no vitals taken for this visit.  Physical Exam  {male adult master:310786} {male adult master:310785}  CMP Latest Ref Rng & Units 12/25/2020 10/25/2020 09/11/2020  Glucose 65 - 99 mg/dL 92 951(O) 841(Y)  BUN 8 - 27 mg/dL 17 16 20   Creatinine 0.76 - 1.27 mg/dL 6.06 3.01  Sodium 134 - 144 mmol/L 139 134 137  Potassium 3.5 - 5.2 mmol/L 4.8 4.9 4.9  Chloride 96 - 106 mmol/L 103 98 101  CO2 20 - 29 mmol/L 19(L) 20 20  Calcium 8.6 - 10.2 mg/dL 9.4 9.8 9.7  Total Protein 6.0 - 8.5 g/dL 7.3 7.5 -  Total Bilirubin 0.0 - 1.2 mg/dL 0.4 0.5 -  Alkaline Phos 44 - 121 IU/L 56 49 -  AST 0 - 40 IU/L 57(H) 71(H) -  ALT 0 - 44 IU/L 61(H) 78(H) -   Lipid Panel     Component Value Date/Time   CHOL 141 10/25/2020 0915   TRIG 76 10/25/2020 0915   HDL 53 10/25/2020 0915   CHOLHDL 2.7 10/25/2020 0915   CHOLHDL 3.8 09/08/2017 0437   VLDL 16 09/08/2017 0437   LDLCALC 73 10/25/2020 0915    CBC    Component Value Date/Time   WBC 5.3 10/25/2020 0915   WBC 7.3 09/08/2017 0437   RBC 5.47 10/25/2020 0915   RBC 5.77 09/08/2017 0437   HGB 15.3 10/25/2020 0915   HCT 46.7 10/25/2020 0915   PLT 237 10/25/2020  0915   MCV 85 10/25/2020 0915   MCH 28.0 10/25/2020 0915   MCH 27.7 09/08/2017 0437   MCHC 32.8 10/25/2020 0915   MCHC 33.8  09/08/2017 0437   RDW 13.3 10/25/2020 0915   LYMPHSABS 2.8 09/05/2017 1739   MONOABS 0.8 09/05/2017 1739   EOSABS 0.1 09/05/2017 1739   BASOSABS 0.0 09/05/2017 1739    ASSESSMENT AND PLAN:  There are no diagnoses linked to this encounter.   Patient was given the opportunity to ask questions.  Patient verbalized understanding of the plan and was able to repeat key elements of the plan. Patient was given clear instructions to go to Emergency Department or return to medical center if symptoms don't improve, worsen, or new problems develop.The patient verbalized understanding.   No orders of the defined types were placed in this encounter.    Requested Prescriptions    No prescriptions requested or ordered in this encounter    No follow-ups on file.  Rema Fendt, NP

## 2021-05-09 ENCOUNTER — Ambulatory Visit: Payer: Self-pay | Admitting: Family

## 2021-05-26 ENCOUNTER — Other Ambulatory Visit: Payer: Self-pay | Admitting: Family

## 2021-05-28 ENCOUNTER — Other Ambulatory Visit: Payer: Self-pay

## 2021-05-28 MED ORDER — CARVEDILOL 12.5 MG PO TABS
12.5000 mg | ORAL_TABLET | Freq: Two times a day (BID) | ORAL | 0 refills | Status: DC
  Filled 2021-05-28: qty 60, 30d supply, fill #0

## 2021-05-28 MED ORDER — SPIRONOLACTONE 25 MG PO TABS
25.0000 mg | ORAL_TABLET | Freq: Every day | ORAL | 0 refills | Status: DC
  Filled 2021-05-28: qty 30, 30d supply, fill #0

## 2021-05-28 MED ORDER — ATORVASTATIN CALCIUM 20 MG PO TABS
20.0000 mg | ORAL_TABLET | Freq: Every day | ORAL | 0 refills | Status: DC
  Filled 2021-05-28: qty 30, 30d supply, fill #0

## 2021-05-29 ENCOUNTER — Other Ambulatory Visit: Payer: Self-pay

## 2021-06-03 ENCOUNTER — Other Ambulatory Visit: Payer: Self-pay | Admitting: Family

## 2021-06-03 DIAGNOSIS — B1920 Unspecified viral hepatitis C without hepatic coma: Secondary | ICD-10-CM

## 2021-06-03 NOTE — Progress Notes (Signed)
Patient ID: Todd Greer, male    DOB: Oct 20, 1955  MRN: [id number]  CC: Diabetes Follow-Up  Subjective: Todd Greer is a 65 y.o. male who presents for diabetes follow-up.   His concerns today include:   DIABETES TYPE 2 FOLLOW-UP: 12/25/2020: - Hemoglobin A1c at goal today at 6.3%, goal < 7%. This is improved from previous hemoglobin A1c of 7.6% on 09/11/2020. Next hemoglobin A1c due July 2022. - Continue Metformin, Glipizide, and Empagliflozin as prescribed.  - Begin Gabapentin as prescribed for polyneuropathy.   06/08/2021: - Doing well on current regimen. No side effects. No issues/concerns. Denies chest pain and shortness of breath. Interested in Mediterranean diet.   Patient Active Problem List   Diagnosis Date Noted   CHF, acute on chronic (HCC) 09/05/2017   Acute respiratory failure with hypoxia (HCC) 09/05/2017   Hypertrophy of prostate without urinary obstruction and other lower urinary tract symptoms (LUTS) 12/03/2013   Erectile dysfunction 08/20/2013   Personal history of colonic polyps 07/20/2013   Exertional dyspnea 06/01/2013    Class: Acute   Obesity (BMI 30-39.9) 06/01/2013   Abnormal resting ECG findings - bifascicular block with PVCs 06/01/2013   DM II (diabetes mellitus, type II), controlled (HCC) 05/28/2013   DYSLIPIDEMIA 02/11/2007   ANXIETY DISORDER 02/11/2007   ALCOHOL USE 02/11/2007   Essential hypertension 02/11/2007     Current Outpatient Medications on File Prior to Visit  Medication Sig Dispense Refill   aspirin 81 MG tablet Take 81 mg by mouth daily.     atorvastatin (LIPITOR) 20 MG tablet Take 1 tablet (20 mg total) by mouth daily. 30 tablet 0   Blood Glucose Monitoring Suppl SUPPLIES MISC Use as directed 100 each 1   carvedilol (COREG) 12.5 MG tablet Take 1 tablet (12.5 mg total) by mouth 2 (two) times daily. 60 tablet 0   Multiple Vitamin (ONE-A-DAY MENS PO) Take 1 tablet by mouth daily.     sacubitril-valsartan (ENTRESTO) 49-51 MG TAKE 1  TABLET BY MOUTH 2 (TWO) TIMES DAILY. 60 tablet 2   spironolactone (ALDACTONE) 25 MG tablet Take 1 tablet (25 mg total) by mouth daily. 30 tablet 0   furosemide (LASIX) 40 MG tablet TAKE 1 TABLET (40 MG TOTAL) BY MOUTH DAILY. (Patient not taking: Reported on 06/08/2021) 30 tablet 2   No current facility-administered medications on file prior to visit.    Allergies  Allergen Reactions   Ace Inhibitors Cough    Tolerates ARB   Amlodipine Other (See Comments)    Fatigue    Social History   Socioeconomic History   Marital status: Married    Spouse name: Not on file   Number of children: Not on file   Years of education: Not on file   Highest education level: Not on file  Occupational History   Not on file  Tobacco Use   Smoking status: Former    Packs/day: 0.50    Years: 10.00    Pack years: 5.00    Types: Cigarettes    Quit date: 10/04/1983    Years since quitting: 37.7   Smokeless tobacco: Never  Vaping Use   Vaping Use: Never used  Substance and Sexual Activity   Alcohol use: Yes    Alcohol/week: 6.0 standard drinks    Types: 6 Standard drinks or equivalent per week   Drug use: No   Sexual activity: Yes  Other Topics Concern   Not on file  Social History Narrative   His married father of 4.  His married for 28 years.  He lives with his wife and daughters.  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  Education: [employer].   He quit smoking in 1985.  He drinks 3 beers a week.   Prior to the onset of his current symptoms, used to work out with weights for at least 40 minutes a day for 3 times a week.  He usually did light weights for many occasions as opposed to heavy weights.      Contacts: Wife [name]; daughter [name]   Social Determinants of Health   Financial Resource Strain: Not on file  Food Insecurity: Not on file  Transportation Needs: Not on file  Physical Activity: Not on file  Stress: Not on file  Social  Connections: Not on file  Intimate Partner Violence: Not on file    Family History  Problem Relation Age of Onset   Stroke Father    Diabetes Father    Hypertension Father    Cancer Sister    Cerebral aneurysm Mother        Died at a young age.   Diabetes Paternal Grandmother    Hypertension Paternal Grandmother    Stroke Paternal Grandmother     Past Surgical History:  Procedure Laterality Date   LEFT HEART CATH AND CORONARY ANGIOGRAPHY N/A 09/09/2017   Procedure: LEFT HEART CATH AND CORONARY ANGIOGRAPHY;  Surgeon: Rinaldo Cloud, MD;  Location: [facility];  Service: Cardiovascular;  Laterality: N/A;   NM  EXERCISE MYOVIEW LTD  06-2013   No ischemia or infarction; note dated images    ROS: Review of Systems Negative except as stated above  PHYSICAL EXAM: BP 124/83 (BP Location: Left Arm, Patient Position: Sitting, Cuff Size: Normal)   Pulse 78   Temp 98.8 F (37.1 C) (Oral)   Ht 5\' 7"  (1.702 m)   Wt 200 lb (90.7 kg)   SpO2 94%   BMI 31.32 kg/m   Physical Exam HENT:     Head: Normocephalic and atraumatic.  Eyes:     Extraocular Movements: Extraocular movements intact.     Conjunctiva/sclera: Conjunctivae normal.     Pupils: Pupils are equal, round, and reactive to light.  Cardiovascular:     Rate and Rhythm: Normal rate and regular rhythm.     Pulses: Normal pulses.     Heart sounds: Normal heart sounds.  Pulmonary:     Effort: Pulmonary effort is normal.     Breath sounds: Normal breath sounds.  Musculoskeletal:     Cervical back: Normal range of motion and neck supple.  Neurological:     General: No focal deficit present.     Mental Status: He is alert and oriented to person, place, and time.  Psychiatric:        Mood and Affect: Mood normal.        Behavior: Behavior normal.   Results for orders placed or performed in visit on 06/08/21  POCT glycosylated hemoglobin (Hb A1C)  Result Value Ref Range   Hemoglobin A1C 6.5 (A) 4.0 - 5.6 %   HbA1c  POC (<> result, manual entry)     HbA1c, POC (prediabetic range)     HbA1c, POC (controlled diabetic range)      ASSESSMENT AND PLAN: 1. Type 2 diabetes mellitus with diabetic polyneuropathy, without long-term current use of insulin (HCC): - Hemoglobin A1c today at goal at 6.5%, goal < 8%. This is about the same as  previous 6.6% on 03/29/2021. Next due January 2023. - Continue Metformin, Empagliflozin, Glipizide, and Gabapentin as prescribed. - Discussed the importance of healthy eating habits, low-carbohydrate diet, low-sugar diet, regular aerobic exercise (at least 150 minutes a week as tolerated) and medication compliance to achieve or maintain control of diabetes. - Follow-up with primary provider in 4 months or sooner if needed.  - POCT glycosylated hemoglobin (Hb A1C) - metFORMIN (GLUCOPHAGE) 1000 MG tablet; Take 1 tablet (1,000 mg total) by mouth 2 (two) times daily with a meal.  Dispense: 240 tablet; Refill: 0 - empagliflozin (JARDIANCE) 10 MG TABS tablet; Take 1 tablet (10 mg total) by mouth daily before breakfast.  Dispense: 120 tablet; Refill: 0 - glipiZIDE (GLIPIZIDE XL) 10 MG 24 hr tablet; Take 2 tablets (20 mg total) by mouth daily.  Dispense: 240 tablet; Refill: 0 - gabapentin (NEURONTIN) 100 MG capsule; Take 1 capsule (100 mg total) by mouth at bedtime.  Dispense: 30 capsule; Refill: 3  2. Need for prophylactic vaccination against Streptococcus pneumoniae (pneumococcus): - Administered today in office. - Pneumococcal conjugate vaccine 20-valent (Prevnar 20)  3. Need for immunization against influenza: - Administered today in office.  - Flu Vaccine QUAD 3mo+IM (Fluarix, Fluzone & Alfiuria Quad PF)   Patient was given the opportunity to ask questions.  Patient verbalized understanding of the plan and was able to repeat key elements of the plan. Patient was given clear instructions to go to Emergency Department or return to medical center if symptoms don't improve, worsen, or  new problems develop.The patient verbalized understanding.   Orders Placed This Encounter  Procedures   Pneumococcal conjugate vaccine 20-valent (Prevnar 20)   Flu Vaccine QUAD 63mo+IM (Fluarix, Fluzone & Alfiuria Quad PF)   POCT glycosylated hemoglobin (Hb A1C)    Requested Prescriptions   Signed Prescriptions Disp Refills   metFORMIN (GLUCOPHAGE) 1000 MG tablet 240 tablet 0    Sig: Take 1 tablet (1,000 mg total) by mouth 2 (two) times daily with a meal.   empagliflozin (JARDIANCE) 10 MG TABS tablet 120 tablet 0    Sig: Take 1 tablet (10 mg total) by mouth daily before breakfast.   glipiZIDE (GLIPIZIDE XL) 10 MG 24 hr tablet 240 tablet 0    Sig: Take 2 tablets (20 mg total) by mouth daily.   gabapentin (NEURONTIN) 100 MG capsule 30 capsule 3    Sig: Take 1 capsule (100 mg total) by mouth at bedtime.    Return in about 4 months (around 10/09/2021) for Follow-Up or next available diabetes .  Rema Fendt, NP

## 2021-06-08 ENCOUNTER — Ambulatory Visit (INDEPENDENT_AMBULATORY_CARE_PROVIDER_SITE_OTHER): Payer: Self-pay | Admitting: Family

## 2021-06-08 ENCOUNTER — Other Ambulatory Visit: Payer: Self-pay

## 2021-06-08 ENCOUNTER — Encounter: Payer: Self-pay | Admitting: Family

## 2021-06-08 VITALS — BP 124/83 | HR 78 | Temp 98.8°F | Ht 67.0 in | Wt 200.0 lb

## 2021-06-08 DIAGNOSIS — E1142 Type 2 diabetes mellitus with diabetic polyneuropathy: Secondary | ICD-10-CM

## 2021-06-08 DIAGNOSIS — Z23 Encounter for immunization: Secondary | ICD-10-CM

## 2021-06-08 LAB — POCT GLYCOSYLATED HEMOGLOBIN (HGB A1C): Hemoglobin A1C: 6.5 % — AB (ref 4.0–5.6)

## 2021-06-08 MED ORDER — GABAPENTIN 100 MG PO CAPS
100.0000 mg | ORAL_CAPSULE | Freq: Every day | ORAL | 3 refills | Status: DC
  Filled 2021-06-08: qty 30, 30d supply, fill #0

## 2021-06-08 MED ORDER — GLIPIZIDE ER 10 MG PO TB24
20.0000 mg | ORAL_TABLET | Freq: Every day | ORAL | 0 refills | Status: DC
  Filled 2021-06-08: qty 240, 120d supply, fill #0
  Filled 2021-06-27: qty 60, 30d supply, fill #0
  Filled 2021-07-24: qty 60, 30d supply, fill #1
  Filled 2021-08-27: qty 60, 30d supply, fill #2
  Filled 2021-10-02: qty 60, 30d supply, fill #3
  Filled 2021-10-02: qty 60, 30d supply, fill #0

## 2021-06-08 MED ORDER — EMPAGLIFLOZIN 10 MG PO TABS
10.0000 mg | ORAL_TABLET | Freq: Every day | ORAL | 0 refills | Status: DC
  Filled 2021-06-08: qty 120, 120d supply, fill #0
  Filled 2021-06-27: qty 30, 30d supply, fill #0
  Filled 2021-07-24: qty 30, 30d supply, fill #1
  Filled 2021-08-27: qty 30, 30d supply, fill #2
  Filled 2021-10-02: qty 30, 30d supply, fill #0
  Filled 2021-10-02: qty 30, 30d supply, fill #3

## 2021-06-08 MED ORDER — METFORMIN HCL 1000 MG PO TABS
1000.0000 mg | ORAL_TABLET | Freq: Two times a day (BID) | ORAL | 0 refills | Status: DC
  Filled 2021-06-08: qty 240, 120d supply, fill #0
  Filled 2021-06-27: qty 60, 30d supply, fill #0
  Filled 2021-07-24: qty 60, 30d supply, fill #1
  Filled 2021-08-28: qty 60, 30d supply, fill #2
  Filled 2021-10-02: qty 60, 30d supply, fill #3
  Filled 2021-10-02: qty 60, 30d supply, fill #0

## 2021-06-08 NOTE — Patient Instructions (Signed)
Mediterranean Diet A Mediterranean diet refers to food and lifestyle choices that are based on the traditions of countries located on the [place]. This way of eating has been shown to help prevent certain conditions and improve outcomes for people who have chronic diseases, like kidney disease and heart disease. What are tips for following this plan? Lifestyle  Cook and eat meals together with your family, when possible.  Drink enough fluid to keep your urine clear or pale yellow.  Be physically active every day. This includes: ? Aerobic exercise like running or swimming. ? Leisure activities like gardening, walking, or housework.  Get 7-8 hours of sleep each night.  If recommended by your health care provider, drink red wine in moderation. This means 1 glass a day for nonpregnant women and 2 glasses a day for men. A glass of wine equals 5 oz (150 mL). Reading food labels  Check the serving size of packaged foods. For foods such as rice and pasta, the serving size refers to the amount of cooked product, not dry.  Check the total fat in packaged foods. Avoid foods that have saturated fat or trans fats.  Check the ingredients list for added sugars, such as corn syrup.   Shopping  At the grocery store, buy most of your food from the areas near the walls of the store. This includes: ? Fresh fruits and vegetables (produce). ? Grains, beans, nuts, and seeds. Some of these may be available in unpackaged forms or large amounts (in bulk). ? Fresh seafood. ? Poultry and eggs. ? Low-fat dairy products.  Buy whole ingredients instead of prepackaged foods.  Buy fresh fruits and vegetables in-season from local farmers markets.  Buy frozen fruits and vegetables in resealable bags.  If you do not have access to quality fresh seafood, buy precooked frozen shrimp or canned fish, such as tuna, salmon, or sardines.  Buy small amounts of raw or cooked vegetables, salads, or olives from  the deli or salad bar at your store.  Stock your pantry so you always have certain foods on hand, such as olive oil, canned tuna, canned tomatoes, rice, pasta, and beans. Cooking  Cook foods with extra-virgin olive oil instead of using butter or other vegetable oils.  Have meat as a side dish, and have vegetables or grains as your main dish. This means having meat in small portions or adding small amounts of meat to foods like pasta or stew.  Use beans or vegetables instead of meat in common dishes like chili or lasagna.  Experiment with different cooking methods. Try roasting or broiling vegetables instead of steaming or sauteing them.  Add frozen vegetables to soups, stews, pasta, or rice.  Add nuts or seeds for added healthy fat at each meal. You can add these to yogurt, salads, or vegetable dishes.  Marinate fish or vegetables using olive oil, lemon juice, garlic, and fresh herbs. Meal planning  Plan to eat 1 vegetarian meal one day each week. Try to work up to 2 vegetarian meals, if possible.  Eat seafood 2 or more times a week.  Have healthy snacks readily available, such as: ? Vegetable sticks with hummus. ? Greek yogurt. ? Fruit and nut trail mix.  Eat balanced meals throughout the week. This includes: ? Fruit: 2-3 servings a day ? Vegetables: 4-5 servings a day ? Low-fat dairy: 2 servings a day ? Fish, poultry, or lean meat: 1 serving a day ? Beans and legumes: 2 or more servings a week ?   Nuts and seeds: 1-2 servings a day ? Whole grains: 6-8 servings a day ? Extra-virgin olive oil: 3-4 servings a day  Limit red meat and sweets to only a few servings a month   What are my food choices?  Mediterranean diet ? Recommended  Grains: Whole-grain pasta. Brown rice. Bulgar wheat. Polenta. Couscous. Whole-wheat bread. Oatmeal. Quinoa.  Vegetables: Artichokes. Beets. Broccoli. Cabbage. Carrots. Eggplant. Green beans. Chard. Kale. Spinach. Onions. Leeks. Peas. Squash.  Tomatoes. Peppers. Radishes.  Fruits: Apples. Apricots. Avocado. Berries. Bananas. Cherries. Dates. Figs. Grapes. Lemons. Melon. Oranges. Peaches. Plums. Pomegranate.  Meats and other protein foods: Beans. Almonds. Sunflower seeds. Pine nuts. Peanuts. Cod. Salmon. Scallops. Shrimp. Tuna. Tilapia. Clams. Oysters. Eggs.  Dairy: Low-fat milk. Cheese. Greek yogurt.  Beverages: Water. Red wine. Herbal tea.  Fats and oils: Extra virgin olive oil. Avocado oil. Grape seed oil.  Sweets and desserts: Greek yogurt with honey. Baked apples. Poached pears. Trail mix.  Seasoning and other foods: Basil. Cilantro. Coriander. Cumin. Mint. Parsley. Sage. Rosemary. Tarragon. Garlic. Oregano. Thyme. Pepper. Balsalmic vinegar. Tahini. Hummus. Tomato sauce. Olives. Mushrooms. ? Limit these  Grains: Prepackaged pasta or rice dishes. Prepackaged cereal with added sugar.  Vegetables: Deep fried potatoes (french fries).  Fruits: Fruit canned in syrup.  Meats and other protein foods: Beef. Pork. Lamb. Poultry with skin. Hot dogs. Bacon.  Dairy: Ice cream. Sour cream. Whole milk.  Beverages: Juice. Sugar-sweetened soft drinks. Beer. Liquor and spirits.  Fats and oils: Butter. Canola oil. Vegetable oil. Beef fat (tallow). Lard.  Sweets and desserts: Cookies. Cakes. Pies. Candy.  Seasoning and other foods: Mayonnaise. Premade sauces and marinades. The items listed may not be a complete list. Talk with your dietitian about what dietary choices are right for you. Summary  The Mediterranean diet includes both food and lifestyle choices.  Eat a variety of fresh fruits and vegetables, beans, nuts, seeds, and whole grains.  Limit the amount of red meat and sweets that you eat.  Talk with your health care provider about whether it is safe for you to drink red wine in moderation. This means 1 glass a day for nonpregnant women and 2 glasses a day for men. A glass of wine equals 5 oz (150 mL). This information  is not intended to replace advice given to you by your health care provider. Make sure you discuss any questions you have with your health care provider. Document Revised: 04/18/2016 Document Reviewed: 04/11/2016 Elsevier Patient Education  2020 Elsevier Inc.  

## 2021-06-09 ENCOUNTER — Other Ambulatory Visit: Payer: Self-pay | Admitting: Family Medicine

## 2021-06-12 ENCOUNTER — Other Ambulatory Visit: Payer: Self-pay

## 2021-06-15 ENCOUNTER — Other Ambulatory Visit: Payer: Self-pay

## 2021-06-27 ENCOUNTER — Other Ambulatory Visit: Payer: Self-pay | Admitting: Family Medicine

## 2021-06-27 ENCOUNTER — Other Ambulatory Visit: Payer: Self-pay

## 2021-06-27 MED ORDER — ATORVASTATIN CALCIUM 20 MG PO TABS
20.0000 mg | ORAL_TABLET | Freq: Every day | ORAL | 0 refills | Status: DC
Start: 2021-06-27 — End: 2021-07-24
  Filled 2021-06-27: qty 30, 30d supply, fill #0

## 2021-06-27 MED ORDER — CARVEDILOL 12.5 MG PO TABS
12.5000 mg | ORAL_TABLET | Freq: Two times a day (BID) | ORAL | 0 refills | Status: DC
  Filled 2021-06-27 (×2): qty 60, 30d supply, fill #0

## 2021-06-27 MED ORDER — SPIRONOLACTONE 25 MG PO TABS
25.0000 mg | ORAL_TABLET | Freq: Every day | ORAL | 0 refills | Status: DC
  Filled 2021-06-27: qty 30, 30d supply, fill #0

## 2021-06-27 NOTE — Telephone Encounter (Signed)
Requested medication (s) are due for refill today: Yes  Requested medication (s) are on the active medication list: Yes  Last refill:  09/20/20  Future visit scheduled: Yes  Notes to clinic:  See request.    Requested Prescriptions  Pending Prescriptions Disp Refills   sacubitril-valsartan (ENTRESTO) 49-51 MG 60 tablet 2    Sig: TAKE 1 TABLET BY MOUTH 2 (TWO) TIMES DAILY.     Off-Protocol Failed - 06/27/2021  8:59 AM      Failed - Medication not assigned to a protocol, review manually.      Passed - Valid encounter within last 12 months    Recent Outpatient Visits           9 months ago Controlled type 2 diabetes mellitus without complication, without long-term current use of insulin (HCC)   [facility] [place], Jeannett Senior L, RPH-CPP   1 year ago Controlled type 2 diabetes mellitus with hyperglycemia, without long-term current use of insulin [facility])   [facility], Manus Rudd, MD   6 years ago Essential hypertension   Primary Care at [facility], [place], [place]   6 years ago Cough   Primary Care at [facility], [place], [place]   6 years ago Diabetes type 2, uncontrolled   Primary Care at [facility], [place], [place]       Future Appointments             In 3 months Rema Fendt, NP Primary Care at [facility] [place]

## 2021-06-28 ENCOUNTER — Other Ambulatory Visit: Payer: Self-pay

## 2021-06-28 ENCOUNTER — Other Ambulatory Visit: Payer: Self-pay | Admitting: Family

## 2021-06-28 DIAGNOSIS — I5022 Chronic systolic (congestive) heart failure: Secondary | ICD-10-CM

## 2021-06-28 DIAGNOSIS — I10 Essential (primary) hypertension: Secondary | ICD-10-CM

## 2021-06-28 MED ORDER — ENTRESTO 49-51 MG PO TABS
1.0000 | ORAL_TABLET | Freq: Two times a day (BID) | ORAL | 0 refills | Status: AC
  Filled 2021-06-28: qty 60, 30d supply, fill #0
  Filled 2021-07-24: qty 60, 30d supply, fill #1

## 2021-06-28 NOTE — Telephone Encounter (Signed)
Sacubitril-Valsartan Sherryll Burger) refilled per patient request. Referral to Cardiology for further evaluation and management of hypertension and heart failure. Their office should call patient within 2 weeks with appointment details.

## 2021-06-29 ENCOUNTER — Other Ambulatory Visit: Payer: Self-pay

## 2021-07-06 ENCOUNTER — Emergency Department (HOSPITAL_COMMUNITY): Payer: Medicare Other

## 2021-07-06 ENCOUNTER — Encounter (HOSPITAL_COMMUNITY): Payer: Self-pay

## 2021-07-06 ENCOUNTER — Other Ambulatory Visit: Payer: Self-pay

## 2021-07-06 ENCOUNTER — Emergency Department (HOSPITAL_COMMUNITY)
Admission: EM | Admit: 2021-07-06 | Discharge: 2021-07-06 | Disposition: A | Discharge status: Home / Self Care | Payer: Medicare Other | Attending: Emergency Medicine | Admitting: Emergency Medicine

## 2021-07-06 DIAGNOSIS — Y92410 Unspecified street and highway as the place of occurrence of the external cause: Secondary | ICD-10-CM | POA: Diagnosis not present

## 2021-07-06 DIAGNOSIS — R1033 Periumbilical pain: Secondary | ICD-10-CM | POA: Insufficient documentation

## 2021-07-06 DIAGNOSIS — S0990XA Unspecified injury of head, initial encounter: Secondary | ICD-10-CM | POA: Diagnosis not present

## 2021-07-06 DIAGNOSIS — M542 Cervicalgia: Secondary | ICD-10-CM | POA: Diagnosis not present

## 2021-07-06 DIAGNOSIS — Z87891 Personal history of nicotine dependence: Secondary | ICD-10-CM | POA: Diagnosis not present

## 2021-07-06 DIAGNOSIS — M546 Pain in thoracic spine: Secondary | ICD-10-CM | POA: Diagnosis not present

## 2021-07-06 DIAGNOSIS — M47812 Spondylosis without myelopathy or radiculopathy, cervical region: Secondary | ICD-10-CM | POA: Diagnosis not present

## 2021-07-06 DIAGNOSIS — Z7984 Long term (current) use of oral hypoglycemic drugs: Secondary | ICD-10-CM | POA: Diagnosis not present

## 2021-07-06 DIAGNOSIS — I509 Heart failure, unspecified: Secondary | ICD-10-CM | POA: Diagnosis not present

## 2021-07-06 DIAGNOSIS — I110 Hypertensive heart disease with heart failure: Secondary | ICD-10-CM | POA: Insufficient documentation

## 2021-07-06 DIAGNOSIS — M549 Dorsalgia, unspecified: Secondary | ICD-10-CM

## 2021-07-06 DIAGNOSIS — E119 Type 2 diabetes mellitus without complications: Secondary | ICD-10-CM | POA: Insufficient documentation

## 2021-07-06 DIAGNOSIS — M47814 Spondylosis without myelopathy or radiculopathy, thoracic region: Secondary | ICD-10-CM | POA: Diagnosis not present

## 2021-07-06 DIAGNOSIS — M545 Low back pain, unspecified: Secondary | ICD-10-CM | POA: Insufficient documentation

## 2021-07-06 DIAGNOSIS — R109 Unspecified abdominal pain: Secondary | ICD-10-CM | POA: Diagnosis not present

## 2021-07-06 DIAGNOSIS — Z79899 Other long term (current) drug therapy: Secondary | ICD-10-CM | POA: Diagnosis not present

## 2021-07-06 DIAGNOSIS — R519 Headache, unspecified: Secondary | ICD-10-CM | POA: Insufficient documentation

## 2021-07-06 DIAGNOSIS — Z7982 Long term (current) use of aspirin: Secondary | ICD-10-CM | POA: Diagnosis not present

## 2021-07-06 DIAGNOSIS — R079 Chest pain, unspecified: Secondary | ICD-10-CM | POA: Diagnosis not present

## 2021-07-06 DIAGNOSIS — Z041 Encounter for examination and observation following transport accident: Secondary | ICD-10-CM | POA: Diagnosis not present

## 2021-07-06 LAB — BASIC METABOLIC PANEL
Anion gap: 10 (ref 5–15)
BUN: 18 mg/dL (ref 8–23)
CO2: 22 mmol/L (ref 22–32)
Calcium: 9.5 mg/dL (ref 8.9–10.3)
Chloride: 104 mmol/L (ref 98–111)
Creatinine, Ser: 0.87 mg/dL (ref 0.61–1.24)
GFR, Estimated: >60 mL/min (ref >60)
Glucose, Bld: 79 mg/dL (ref 70–99)
Potassium: 4.5 mmol/L (ref 3.5–5.1)
Sodium: 136 mmol/L (ref 135–145)

## 2021-07-06 LAB — CBC WITH DIFFERENTIAL/PLATELET
Abs Immature Granulocytes: 0.08 10*3/uL — ABNORMAL HIGH (ref 0.00–0.07)
Basophils Absolute: 0.1 10*3/uL (ref 0.0–0.1)
Basophils Relative: 1 %
Eosinophils Absolute: 0.2 10*3/uL (ref 0.0–0.5)
Eosinophils Relative: 3 %
HCT: 43.4 % (ref 39.0–52.0)
Hemoglobin: 14.5 g/dL (ref 13.0–17.0)
Immature Granulocytes: 2 %
Lymphocytes Relative: 45 %
Lymphs Abs: 2.5 10*3/uL (ref 0.7–4.0)
MCH: 27.8 pg (ref 26.0–34.0)
MCHC: 33.4 g/dL (ref 30.0–36.0)
MCV: 83.1 fL (ref 80.0–100.0)
Monocytes Absolute: 0.9 10*3/uL (ref 0.1–1.0)
Monocytes Relative: 17 %
Neutro Abs: 1.7 10*3/uL (ref 1.7–7.7)
Neutrophils Relative %: 32 %
Platelets: 248 10*3/uL (ref 150–400)
RBC: 5.22 MIL/uL (ref 4.22–5.81)
RDW: 14.1 % (ref 11.5–15.5)
WBC: 5.4 10*3/uL (ref 4.0–10.5)
nRBC: 0 % (ref 0.0–0.2)

## 2021-07-06 MED ORDER — HYDROCODONE-ACETAMINOPHEN 5-325 MG PO TABS
1.0000 | ORAL_TABLET | Freq: Once | ORAL | Status: AC
  Administered 2021-07-06: 1 via ORAL
  Filled 2021-07-06: qty 1

## 2021-07-06 MED ORDER — ONDANSETRON 8 MG PO TBDP
8.0000 mg | ORAL_TABLET | Freq: Once | ORAL | Status: AC
  Administered 2021-07-06: 8 mg via ORAL
  Filled 2021-07-06: qty 1

## 2021-07-06 MED ORDER — ACETAMINOPHEN 325 MG PO TABS
650.0000 mg | ORAL_TABLET | Freq: Once | ORAL | Status: AC
  Administered 2021-07-06: 650 mg via ORAL
  Filled 2021-07-06: qty 2

## 2021-07-06 MED ORDER — IOHEXOL 350 MG/ML SOLN
80.0000 mL | Freq: Once | INTRAVENOUS | Status: AC | PRN
  Administered 2021-07-06: 80 mL via INTRAVENOUS
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
## 2021-07-06 NOTE — Discharge Instructions (Addendum)
Imaging today was reassuring, did not show any signs of emergent disease.  You do have narrowing in the spinal column which is chronic but should be addressed.  I have given you information for neurosurgery and can call them for consultation and evaluation.  Take Tylenol Motrin as needed for any pain you are feeling.  You can also take Excedrin as needed for headache symptoms.  If things change or worsen return back to the ED as needed.  Advise following with your primary care in the next week or so to make sure things are improved.

## 2021-07-10 ENCOUNTER — Other Ambulatory Visit: Payer: Self-pay

## 2021-07-11 DIAGNOSIS — S161XXA Strain of muscle, fascia and tendon at neck level, initial encounter: Secondary | ICD-10-CM | POA: Diagnosis not present

## 2021-07-11 DIAGNOSIS — I10 Essential (primary) hypertension: Secondary | ICD-10-CM | POA: Diagnosis not present

## 2021-07-11 DIAGNOSIS — S39012A Strain of muscle, fascia and tendon of lower back, initial encounter: Secondary | ICD-10-CM | POA: Diagnosis not present

## 2021-07-18 ENCOUNTER — Ambulatory Visit (INDEPENDENT_AMBULATORY_CARE_PROVIDER_SITE_OTHER): Payer: Medicare Other | Admitting: Nurse Practitioner

## 2021-07-18 ENCOUNTER — Encounter: Payer: Self-pay | Admitting: Nurse Practitioner

## 2021-07-18 ENCOUNTER — Other Ambulatory Visit: Payer: Self-pay

## 2021-07-18 VITALS — BP 116/79 | HR 79 | Resp 18

## 2021-07-18 DIAGNOSIS — R519 Headache, unspecified: Secondary | ICD-10-CM

## 2021-07-18 MED ORDER — PREDNISONE 10 MG PO TABS
10.0000 mg | ORAL_TABLET | Freq: Every day | ORAL | 0 refills | Status: AC
  Filled 2021-07-18: qty 5, 5d supply, fill #0

## 2021-07-18 MED ORDER — CYCLOBENZAPRINE HCL 5 MG PO TABS
5.0000 mg | ORAL_TABLET | Freq: Three times a day (TID) | ORAL | 0 refills | Status: DC | PRN
  Filled 2021-07-18: qty 30, 10d supply, fill #0
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
## 2021-07-18 NOTE — Assessment & Plan Note (Signed)
Stay well hydrated  Will order prednisone - please keep close watch on blood sugars  Will order muscle relaxer  Please keep upcoming appointment with Neurosurgery   Follow up:  Follow up with Amy in 3 weeks

## 2021-07-19 ENCOUNTER — Other Ambulatory Visit: Payer: Self-pay

## 2021-07-24 ENCOUNTER — Other Ambulatory Visit: Payer: Self-pay | Admitting: Family

## 2021-07-25 ENCOUNTER — Other Ambulatory Visit: Payer: Self-pay

## 2021-07-25 MED ORDER — ATORVASTATIN CALCIUM 20 MG PO TABS
20.0000 mg | ORAL_TABLET | Freq: Every day | ORAL | 0 refills | Status: DC
  Filled 2021-07-25: qty 30, 30d supply, fill #0

## 2021-07-25 MED ORDER — CARVEDILOL 12.5 MG PO TABS
12.5000 mg | ORAL_TABLET | Freq: Two times a day (BID) | ORAL | 0 refills | Status: DC
  Filled 2021-07-25: qty 60, 30d supply, fill #0

## 2021-07-25 MED ORDER — SPIRONOLACTONE 25 MG PO TABS
25.0000 mg | ORAL_TABLET | Freq: Every day | ORAL | 0 refills | Status: DC
  Filled 2021-07-25: qty 30, 30d supply, fill #0
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
## 2021-08-08 ENCOUNTER — Ambulatory Visit (INDEPENDENT_AMBULATORY_CARE_PROVIDER_SITE_OTHER): Payer: Medicare Other | Admitting: Family

## 2021-08-08 ENCOUNTER — Other Ambulatory Visit: Payer: Self-pay

## 2021-08-08 ENCOUNTER — Encounter: Payer: Self-pay | Admitting: Family

## 2021-08-08 VITALS — BP 118/81 | HR 86 | Temp 98.3°F | Resp 18 | Ht 67.21 in | Wt 206.4 lb

## 2021-08-08 DIAGNOSIS — R519 Headache, unspecified: Secondary | ICD-10-CM | POA: Diagnosis not present

## 2021-08-08 DIAGNOSIS — E1142 Type 2 diabetes mellitus with diabetic polyneuropathy: Secondary | ICD-10-CM

## 2021-08-08 DIAGNOSIS — R5383 Other fatigue: Secondary | ICD-10-CM

## 2021-08-08 MED ORDER — ONETOUCH ULTRA 2 W/DEVICE KIT: 1.0000 | PACK | Freq: Three times a day (TID) | 0 refills | Status: DC

## 2021-08-08 MED ORDER — ONETOUCH ULTRASOFT LANCETS MISC: 12 refills | Status: DC

## 2021-08-08 MED ORDER — ONETOUCH ULTRA VI STRP: ORAL_STRIP | 12 refills | Status: DC

## 2021-08-09 ENCOUNTER — Other Ambulatory Visit: Payer: Self-pay

## 2021-08-09 ENCOUNTER — Encounter: Payer: Self-pay | Admitting: Family

## 2021-08-09 DIAGNOSIS — E1142 Type 2 diabetes mellitus with diabetic polyneuropathy: Secondary | ICD-10-CM

## 2021-08-09 LAB — VITAMIN B12: Vitamin B-12: 656 pg/mL (ref 232–1245)

## 2021-08-09 LAB — VITAMIN D 25 HYDROXY (VIT D DEFICIENCY, FRACTURES)

## 2021-08-09 MED ORDER — ONETOUCH ULTRA VI STRP: ORAL_STRIP | 12 refills | Status: DC

## 2021-08-09 MED ORDER — ONETOUCH ULTRASOFT LANCETS MISC: 12 refills | Status: DC

## 2021-08-09 MED ORDER — ONETOUCH ULTRA 2 W/DEVICE KIT: 1.0000 | PACK | Freq: Three times a day (TID) | 0 refills | Status: DC

## 2021-08-09 NOTE — Progress Notes (Signed)
Vitamin B12 normal

## 2021-08-09 NOTE — Progress Notes (Signed)
Per [facility] Vitamin D canceled. Patient will need to return to lab for redraw.

## 2021-08-10 ENCOUNTER — Encounter: Payer: Self-pay | Admitting: Family

## 2021-08-10 ENCOUNTER — Other Ambulatory Visit: Payer: Self-pay | Admitting: Family

## 2021-08-10 DIAGNOSIS — B1920 Unspecified viral hepatitis C without hepatic coma: Secondary | ICD-10-CM

## 2021-08-10 LAB — SPECIMEN STATUS REPORT

## 2021-08-10 LAB — VITAMIN D 25 HYDROXY (VIT D DEFICIENCY, FRACTURES): Vit D, 25-Hydroxy: 62.6 ng/mL (ref 30.0–100.0)

## 2021-08-10 NOTE — Progress Notes (Signed)
Vitamin D normal. Continue over-the-counter supplement.

## 2021-08-15 ENCOUNTER — Encounter: Payer: Self-pay | Admitting: Family

## 2021-08-15 ENCOUNTER — Telehealth: Payer: Self-pay | Admitting: Family

## 2021-08-15 ENCOUNTER — Other Ambulatory Visit: Payer: Self-pay

## 2021-08-15 DIAGNOSIS — E1142 Type 2 diabetes mellitus with diabetic polyneuropathy: Secondary | ICD-10-CM

## 2021-08-15 MED ORDER — ONETOUCH ULTRA VI STRP: ORAL_STRIP | 12 refills | Status: DC

## 2021-08-15 MED ORDER — ONETOUCH DELICA LANCETS 33G MISC: 12 refills | Status: DC

## 2021-08-15 NOTE — Telephone Encounter (Signed)
Need clarification How many times per day- vs "use as instructed" instruction for both:  Lancets (ONETOUCH ULTRASOFT) lancets [[id number]]   glucose blood (ONETOUCH ULTRA) test strip [[id number]]

## 2021-08-15 NOTE — Telephone Encounter (Signed)
Att to contact pt to advise he should be testing 4x a day before and after meals

## 2021-08-15 NOTE — Addendum Note (Signed)
Addended by: Margorie John on: 08/15/2021 04:50 PM   Modules accepted: Orders

## 2021-08-23 ENCOUNTER — Other Ambulatory Visit: Payer: Self-pay

## 2021-08-27 ENCOUNTER — Other Ambulatory Visit: Payer: Self-pay | Admitting: Family

## 2021-08-28 ENCOUNTER — Other Ambulatory Visit: Payer: Self-pay

## 2021-08-28 MED ORDER — CARVEDILOL 12.5 MG PO TABS
12.5000 mg | ORAL_TABLET | Freq: Two times a day (BID) | ORAL | 0 refills | Status: DC
  Filled 2021-08-28: qty 60, 30d supply, fill #0

## 2021-08-28 MED ORDER — SPIRONOLACTONE 25 MG PO TABS
25.0000 mg | ORAL_TABLET | Freq: Every day | ORAL | 0 refills | Status: DC
  Filled 2021-08-28: qty 30, 30d supply, fill #0

## 2021-08-28 MED ORDER — ATORVASTATIN CALCIUM 20 MG PO TABS
20.0000 mg | ORAL_TABLET | Freq: Every day | ORAL | 0 refills | Status: DC
  Filled 2021-08-28: qty 30, 30d supply, fill #0

## 2021-08-29 ENCOUNTER — Other Ambulatory Visit: Payer: Self-pay

## 2021-09-13 DIAGNOSIS — I10 Essential (primary) hypertension: Secondary | ICD-10-CM | POA: Diagnosis not present

## 2021-09-13 DIAGNOSIS — S39012A Strain of muscle, fascia and tendon of lower back, initial encounter: Secondary | ICD-10-CM | POA: Diagnosis not present

## 2021-10-02 ENCOUNTER — Other Ambulatory Visit: Payer: Self-pay | Admitting: Family

## 2021-10-02 ENCOUNTER — Other Ambulatory Visit: Payer: Self-pay

## 2021-10-02 MED ORDER — CARVEDILOL 12.5 MG PO TABS
12.5000 mg | ORAL_TABLET | Freq: Two times a day (BID) | ORAL | 0 refills | Status: DC
  Filled 2021-10-02 (×2): qty 60, 30d supply, fill #0

## 2021-10-02 MED ORDER — ATORVASTATIN CALCIUM 20 MG PO TABS
20.0000 mg | ORAL_TABLET | Freq: Every day | ORAL | 0 refills | Status: DC
  Filled 2021-10-02 (×2): qty 30, 30d supply, fill #0

## 2021-10-02 MED ORDER — SPIRONOLACTONE 25 MG PO TABS
25.0000 mg | ORAL_TABLET | Freq: Every day | ORAL | 0 refills | Status: DC
  Filled 2021-10-02 (×2): qty 30, 30d supply, fill #0

## 2021-10-03 ENCOUNTER — Other Ambulatory Visit: Payer: Self-pay

## 2021-10-04 NOTE — Progress Notes (Signed)
Patient ID: Babe Clenney, male    DOB: 03-24-1956  MRN: [id number]  CC: Diabetes Follow-Up   Subjective: Kavion Mancinas is a 66 y.o. male who presents for diabetes follow-up.   His concerns today include:  DIABETES TYPE 2 FOLLOW-UP: 06/08/2021: - Continue Metformin, Empagliflozin, Glipizide, and Gabapentin as prescribed.  10/09/2021: Taking medications as prescribed. Home blood sugars 120's-130's. No issues/concerns.  2. FATIGUE: Persisting. Was called by Infectious Disease related to getting additional lab work. Would like to have collected today in office. Taking vitamin D over-the-counter. Vitamin B12 recently normal.  Patient Active Problem List   Diagnosis Date Noted   Nonintractable headache 07/18/2021   CHF, acute on chronic ([place]) 09/05/2017   Acute respiratory failure with hypoxia ([place]) 09/05/2017   Hypertrophy of prostate without urinary obstruction and other lower urinary tract symptoms (LUTS) 12/03/2013   Erectile dysfunction 08/20/2013   Personal history of colonic polyps 07/20/2013   Exertional dyspnea 06/01/2013    Class: Acute   Obesity (BMI 30-39.9) 06/01/2013   Abnormal resting ECG findings - bifascicular block with PVCs 06/01/2013   DM II (diabetes mellitus, type II), controlled ([place]) 05/28/2013   DYSLIPIDEMIA 02/11/2007   ANXIETY DISORDER 02/11/2007   ALCOHOL USE 02/11/2007   Essential hypertension 02/11/2007     Current Outpatient Medications on File Prior to Visit  Medication Sig Dispense Refill   aspirin 81 MG tablet Take 81 mg by mouth daily.     atorvastatin (LIPITOR) 20 MG tablet Take 1 tablet (20 mg total) by mouth daily. 30 tablet 0   Blood Glucose Monitoring Suppl (ONE TOUCH ULTRA 2) w/Device KIT 1 Device by Does not apply route 4 (four) times daily -  before meals and at bedtime. 1 kit 0   carvedilol (COREG) 12.5 MG tablet Take 1 tablet (12.5 mg total) by mouth 2 (two) times daily. 60 tablet 0   cyclobenzaprine (FLEXERIL) 5 MG tablet Take 1 tablet  (5 mg total) by mouth 3 (three) times daily as needed for muscle spasms. 30 tablet 0   glucose blood (ONETOUCH ULTRA) test strip Check blood sugars 4 times a day before and after meals 100 each 12   Multiple Vitamin (ONE-A-DAY MENS PO) Take 1 tablet by mouth daily.     OneTouch Delica Lancets 95J MISC Check blood sugars 4 times a day before and after meals 100 each 12   spironolactone (ALDACTONE) 25 MG tablet Take 1 tablet (25 mg total) by mouth daily. 30 tablet 0   furosemide (LASIX) 40 MG tablet TAKE 1 TABLET (40 MG TOTAL) BY MOUTH DAILY. (Patient not taking: Reported on 06/08/2021) 30 tablet 2   No current facility-administered medications on file prior to visit.    Allergies  Allergen Reactions   Ace Inhibitors Cough    Tolerates ARB   Amlodipine Other (See Comments)    Fatigue    Social History   Socioeconomic History   Marital status: Married    Spouse name: Not on file   Number of children: Not on file   Years of education: Not on file   Highest education level: Not on file  Occupational History   Not on file  Tobacco Use   Smoking status: Former    Packs/day: 0.50    Years: 10.00    Pack years: 5.00    Types: Cigarettes    Quit date: 10/04/1983    Years since quitting: 38.0   Smokeless tobacco: Never  Vaping Use   Vaping Use: Never used  Substance and Sexual Activity   Alcohol use: Yes    Alcohol/week: 6.0 standard drinks    Types: 6 Standard drinks or equivalent per week   Drug use: No   Sexual activity: Yes  Other Topics Concern   Not on file  Social History Narrative   His married father of 4.  His married for 28 years.  He lives with his wife and daughters.  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  Education: [employer].   He quit smoking in 1985.  He drinks 3 beers a week.   Prior to the onset of his current symptoms, used to work out with weights for at least 40 minutes a day for 3 times a week.  He usually did  light weights for many occasions as opposed to heavy weights.      Contacts: Wife Nicki Reaper; daughter Sandi Mealy   Social Determinants of Health   Financial Resource Strain: Not on file  Food Insecurity: Not on file  Transportation Needs: Not on file  Physical Activity: Not on file  Stress: Not on file  Social Connections: Not on file  Intimate Partner Violence: Not on file    Family History  Problem Relation Age of Onset   Stroke Father    Diabetes Father    Hypertension Father    Cancer Sister    Cerebral aneurysm Mother        Died at a young age.   Diabetes Paternal Grandmother    Hypertension Paternal Grandmother    Stroke Paternal Grandmother     Past Surgical History:  Procedure Laterality Date   LEFT HEART CATH AND CORONARY ANGIOGRAPHY N/A 09/09/2017   Procedure: LEFT HEART CATH AND CORONARY ANGIOGRAPHY;  Surgeon: Charolette Forward, MD;  Location: [facility];  Service: Cardiovascular;  Laterality: N/A;   NM  EXERCISE MYOVIEW LTD  06-2013   No ischemia or infarction; note dated images    ROS: Review of Systems Negative except as stated above  PHYSICAL EXAM: BP 126/86    Pulse 68    Resp 20    Ht _0  (1.702 m)    Wt 206 lb 2 oz (93.5 kg)    SpO2 95%    BMI 32.28 kg/m   Physical Exam HENT:     Head: Normocephalic and atraumatic.  Eyes:     Extraocular Movements: Extraocular movements intact.     Conjunctiva/sclera: Conjunctivae normal.     Pupils: Pupils are equal, round, and reactive to light.  Cardiovascular:     Rate and Rhythm: Normal rate and regular rhythm.     Pulses: Normal pulses.     Heart sounds: Normal heart sounds.  Pulmonary:     Effort: Pulmonary effort is normal.     Breath sounds: Normal breath sounds.  Musculoskeletal:     Cervical back: Normal range of motion and neck supple.  Neurological:     General: No focal deficit present.     Mental Status: He is alert and oriented to person, place, and time.  Psychiatric:         Mood and Affect: Mood normal.        Behavior: Behavior normal.   Results for orders placed or performed in visit on 10/09/21  POCT glycosylated hemoglobin (Hb A1C)  Result Value Ref Range   Hemoglobin A1C 6.8 (A) 4.0 - 5.6 %   HbA1c POC (<> result, manual entry)     HbA1c,  POC (prediabetic range)     HbA1c, POC (controlled diabetic range)      ASSESSMENT AND PLAN: 1. Type 2 diabetes mellitus with diabetic polyneuropathy, without long-term current use of insulin ([place]): - Hemoglobin A1c today at goal at 6.8%, goal < 8%.  - Continue Metformin, Empagliflozin, Glipizide, and Gabapentin as prescribed. - Discussed the importance of healthy eating habits, low-carbohydrate diet, low-sugar diet, regular aerobic exercise (at least 150 minutes a week as tolerated) and medication compliance to achieve or maintain control of diabetes. - Follow-up with primary provider in 4 months or sooner if needed.  - POCT glycosylated hemoglobin (Hb A1C) - metFORMIN (GLUCOPHAGE) 1000 MG tablet; Take 1 tablet (1,000 mg total) by mouth 2 (two) times daily with a meal.  Dispense: 240 tablet; Refill: 0 - empagliflozin (JARDIANCE) 10 MG TABS tablet; Take 1 tablet (10 mg total) by mouth daily before breakfast.  Dispense: 120 tablet; Refill: 0 - glipiZIDE (GLIPIZIDE XL) 10 MG 24 hr tablet; Take 2 tablets (20 mg total) by mouth daily.  Dispense: 240 tablet; Refill: 0 - gabapentin (NEURONTIN) 100 MG capsule; Take 1 capsule (100 mg total) by mouth at bedtime.  Dispense: 30 capsule; Refill: 3  2. Hepatitis C virus infection without hepatic coma, unspecified chronicity: - Screening as recommended per [facility].  - HCV RNA quant; Future  3. Thyroid disorder screen: - TSH to check thyroid function.  - TSH; Future   Patient was given the opportunity to ask questions.  Patient verbalized understanding of the plan and was able to repeat key elements of the plan. Patient was given clear instructions to go to  Emergency Department or return to medical center if symptoms don't improve, worsen, or new problems develop.The patient verbalized understanding.   Orders Placed This Encounter  Procedures   POCT glycosylated hemoglobin (Hb A1C)     Requested Prescriptions   Signed Prescriptions Disp Refills   metFORMIN (GLUCOPHAGE) 1000 MG tablet 240 tablet 0    Sig: Take 1 tablet (1,000 mg total) by mouth 2 (two) times daily with a meal.   empagliflozin (JARDIANCE) 10 MG TABS tablet 120 tablet 0    Sig: Take 1 tablet (10 mg total) by mouth daily before breakfast.   glipiZIDE (GLIPIZIDE XL) 10 MG 24 hr tablet 240 tablet 0    Sig: Take 2 tablets (20 mg total) by mouth daily.   gabapentin (NEURONTIN) 100 MG capsule 30 capsule 3    Sig: Take 1 capsule (100 mg total) by mouth at bedtime.    Return in about 4 months (around 02/06/2022) for Follow-Up or next available diabetes .  Camillia Herter, NP

## 2021-10-09 ENCOUNTER — Other Ambulatory Visit: Payer: Self-pay

## 2021-10-09 ENCOUNTER — Ambulatory Visit (INDEPENDENT_AMBULATORY_CARE_PROVIDER_SITE_OTHER): Payer: Medicare Other | Admitting: Family

## 2021-10-09 ENCOUNTER — Encounter: Payer: Self-pay | Admitting: Family

## 2021-10-09 VITALS — BP 126/86 | HR 68 | Resp 20 | Ht 67.0 in | Wt 206.1 lb

## 2021-10-09 DIAGNOSIS — Z1329 Encounter for screening for other suspected endocrine disorder: Secondary | ICD-10-CM

## 2021-10-09 DIAGNOSIS — E1142 Type 2 diabetes mellitus with diabetic polyneuropathy: Secondary | ICD-10-CM | POA: Diagnosis not present

## 2021-10-09 DIAGNOSIS — B1920 Unspecified viral hepatitis C without hepatic coma: Secondary | ICD-10-CM | POA: Diagnosis not present

## 2021-10-09 LAB — POCT GLYCOSYLATED HEMOGLOBIN (HGB A1C): Hemoglobin A1C: 6.8 % — AB (ref 4.0–5.6)

## 2021-10-09 MED ORDER — EMPAGLIFLOZIN 10 MG PO TABS
10.0000 mg | ORAL_TABLET | Freq: Every day | ORAL | 0 refills | Status: DC
  Filled 2021-10-09: qty 120, 120d supply, fill #0
  Filled 2021-10-29: qty 30, 30d supply, fill #0
  Filled 2021-11-28: qty 30, 30d supply, fill #1
  Filled 2021-12-25: qty 30, 30d supply, fill #2
  Filled 2022-01-28: qty 30, 30d supply, fill #3

## 2021-10-09 MED ORDER — GABAPENTIN 100 MG PO CAPS
100.0000 mg | ORAL_CAPSULE | Freq: Every day | ORAL | 3 refills | Status: DC
  Filled 2021-10-09: qty 30, 30d supply, fill #0
  Filled 2022-04-29: qty 30, 30d supply, fill #1

## 2021-10-09 MED ORDER — GLIPIZIDE ER 10 MG PO TB24
20.0000 mg | ORAL_TABLET | Freq: Every day | ORAL | 0 refills | Status: DC
  Filled 2021-10-09: qty 240, 120d supply, fill #0
  Filled 2021-10-29: qty 60, 30d supply, fill #0
  Filled 2021-11-28: qty 60, 30d supply, fill #1
  Filled 2021-12-25: qty 60, 30d supply, fill #2
  Filled 2022-01-28: qty 60, 30d supply, fill #3

## 2021-10-09 MED ORDER — METFORMIN HCL 1000 MG PO TABS
1000.0000 mg | ORAL_TABLET | Freq: Two times a day (BID) | ORAL | 0 refills | Status: DC
  Filled 2021-10-09: qty 240, 120d supply, fill #0
  Filled 2021-10-29: qty 60, 30d supply, fill #0
  Filled 2021-11-28: qty 60, 30d supply, fill #1
  Filled 2021-12-25: qty 60, 30d supply, fill #2
  Filled 2022-01-28: qty 60, 30d supply, fill #3

## 2021-10-09 NOTE — Progress Notes (Signed)
Diabetes discussed in office.

## 2021-10-09 NOTE — Progress Notes (Signed)
Noted  

## 2021-10-12 ENCOUNTER — Other Ambulatory Visit: Payer: Self-pay

## 2021-10-29 ENCOUNTER — Other Ambulatory Visit: Payer: Self-pay

## 2021-10-29 ENCOUNTER — Other Ambulatory Visit: Payer: Self-pay | Admitting: Family

## 2021-10-29 DIAGNOSIS — I5022 Chronic systolic (congestive) heart failure: Secondary | ICD-10-CM

## 2021-10-29 DIAGNOSIS — I10 Essential (primary) hypertension: Secondary | ICD-10-CM

## 2021-10-30 MED ORDER — SPIRONOLACTONE 25 MG PO TABS
25.0000 mg | ORAL_TABLET | Freq: Every day | ORAL | 2 refills | Status: DC
  Filled 2021-10-30: qty 30, 30d supply, fill #0
  Filled 2021-11-28: qty 30, 30d supply, fill #1
  Filled 2021-12-25: qty 30, 30d supply, fill #2

## 2021-10-30 MED ORDER — CARVEDILOL 12.5 MG PO TABS
12.5000 mg | ORAL_TABLET | Freq: Two times a day (BID) | ORAL | 2 refills | Status: DC
  Filled 2021-10-30: qty 60, 30d supply, fill #0
  Filled 2021-11-28: qty 60, 30d supply, fill #1
  Filled 2021-12-25: qty 60, 30d supply, fill #2

## 2021-10-30 NOTE — Telephone Encounter (Signed)
Call patient to confirm that he is still followed by Rinaldo Cloud, MD at Cardiology for heart failure and hypertension. It is very important that he keep those appointments as scheduled.

## 2021-10-31 ENCOUNTER — Other Ambulatory Visit: Payer: Self-pay

## 2021-11-28 ENCOUNTER — Other Ambulatory Visit: Payer: Self-pay

## 2021-11-29 ENCOUNTER — Other Ambulatory Visit: Payer: Self-pay

## 2021-12-25 ENCOUNTER — Other Ambulatory Visit: Payer: Self-pay | Admitting: Family

## 2021-12-26 ENCOUNTER — Other Ambulatory Visit: Payer: Self-pay

## 2021-12-26 MED ORDER — ATORVASTATIN CALCIUM 20 MG PO TABS
20.0000 mg | ORAL_TABLET | Freq: Every day | ORAL | 0 refills | Status: DC
  Filled 2021-12-26: qty 90, 90d supply, fill #0

## 2021-12-27 ENCOUNTER — Other Ambulatory Visit: Payer: Self-pay

## 2022-01-28 ENCOUNTER — Other Ambulatory Visit: Payer: Self-pay | Admitting: Family

## 2022-01-28 DIAGNOSIS — I5022 Chronic systolic (congestive) heart failure: Secondary | ICD-10-CM

## 2022-01-28 DIAGNOSIS — I10 Essential (primary) hypertension: Secondary | ICD-10-CM

## 2022-01-29 ENCOUNTER — Other Ambulatory Visit: Payer: Self-pay

## 2022-01-30 ENCOUNTER — Other Ambulatory Visit: Payer: Self-pay

## 2022-01-30 DIAGNOSIS — E119 Type 2 diabetes mellitus without complications: Secondary | ICD-10-CM | POA: Diagnosis not present

## 2022-01-30 MED ORDER — SPIRONOLACTONE 25 MG PO TABS
25.0000 mg | ORAL_TABLET | Freq: Every day | ORAL | 2 refills | Status: DC
  Filled 2022-01-30: qty 30, 30d supply, fill #0
  Filled 2022-02-24: qty 30, 30d supply, fill #1
  Filled 2022-03-28: qty 30, 30d supply, fill #2

## 2022-01-30 MED ORDER — CARVEDILOL 12.5 MG PO TABS
12.5000 mg | ORAL_TABLET | Freq: Two times a day (BID) | ORAL | 2 refills | Status: DC
  Filled 2022-01-30: qty 60, 30d supply, fill #0
  Filled 2022-02-24: qty 60, 30d supply, fill #1
  Filled 2022-03-28: qty 60, 30d supply, fill #2

## 2022-01-30 MED ORDER — ATORVASTATIN CALCIUM 20 MG PO TABS
20.0000 mg | ORAL_TABLET | Freq: Every day | ORAL | 0 refills | Status: DC
  Filled 2022-01-30 – 2022-03-04 (×3): qty 90, 90d supply, fill #0

## 2022-01-30 NOTE — Progress Notes (Signed)
Erroneous encounter-disregard

## 2022-01-30 NOTE — Telephone Encounter (Signed)
Requested Prescriptions  Pending Prescriptions Disp Refills  . carvedilol (COREG) 12.5 MG tablet 60 tablet 2    Sig: Take 1 tablet (12.5 mg total) by mouth 2 (two) times daily.     Cardiovascular: Beta Blockers 3 Failed - 01/28/2022 10:25 PM      Failed - AST in normal range and within 360 days    AST  Date Value Ref Range Status  12/25/2020 57 (H) 0 - 40 IU/L Final         Failed - ALT in normal range and within 360 days    ALT  Date Value Ref Range Status  12/25/2020 61 (H) 0 - 44 IU/L Final         Passed - Cr in normal range and within 360 days    Creat  Date Value Ref Range Status  06/03/2014 0.86 0.50 - 1.35 mg/dL Final   Creatinine, Ser  Date Value Ref Range Status  07/06/2021 0.87 0.61 - 1.24 mg/dL Final         Passed - Last BP in normal range    BP Readings from Last 1 Encounters:  10/09/21 126/86         Passed - Last Heart Rate in normal range    Pulse Readings from Last 1 Encounters:  10/09/21 68         Passed - Valid encounter within last 6 months    Recent Outpatient Visits          3 months ago Type 2 diabetes mellitus with diabetic polyneuropathy, without long-term current use of insulin ([place])   Primary Care at [facility], Amy J, NP   5 months ago Nonintractable headache, unspecified chronicity pattern, unspecified headache type   Primary Care at [facility], Amy J, NP   6 months ago Nonintractable headache, unspecified chronicity pattern, unspecified headache type   Primary Care at [facility], Kriste Basque, NP   7 months ago Type 2 diabetes mellitus with diabetic polyneuropathy, without long-term current use of insulin [facility])   Primary Care at [facility], Amy J, NP   10 months ago Type 2 diabetes mellitus with diabetic polyneuropathy, without long-term current use of insulin [facility])   Primary Care at [facility] [place]      Future Appointments            In 6 days Camillia Herter, NP  Primary Care at [facility]           . spironolactone (ALDACTONE) 25 MG tablet 30 tablet 2    Sig: Take 1 tablet (25 mg total) by mouth daily.     Cardiovascular: Diuretics - Aldosterone Antagonist Failed - 01/28/2022 10:25 PM      Failed - Cr in normal range and within 180 days    Creat  Date Value Ref Range Status  06/03/2014 0.86 0.50 - 1.35 mg/dL Final   Creatinine, Ser  Date Value Ref Range Status  07/06/2021 0.87 0.61 - 1.24 mg/dL Final         Failed - K in normal range and within 180 days    Potassium  Date Value Ref Range Status  07/06/2021 4.5 3.5 - 5.1 mmol/L Final         Failed - Na in normal range and within 180 days    Sodium  Date Value Ref Range Status  07/06/2021 136 135 - 145 mmol/L Final  12/25/2020 139 134 - 144 mmol/L Final  Failed - eGFR is 30 or above and within 180 days    GFR, Est African American  Date Value Ref Range Status  06/03/2014 >89 mL/min Final   GFR calc Af Amer  Date Value Ref Range Status  10/25/2020 77 >59 mL/min/1.73 Final    Comment:    **In accordance with recommendations from the NKF-ASN Task force,**   [facility] is in the process of updating its eGFR calculation to the   2021 CKD-EPI creatinine equation that estimates kidney function   without a race variable.    GFR, Est Non African American  Date Value Ref Range Status  06/03/2014 >89 mL/min Final    Comment:      The estimated GFR is a calculation valid for adults (>=68 years old) that uses the CKD-EPI algorithm to adjust for age and sex. It is   not to be used for children, pregnant women, hospitalized patients,    patients on dialysis, or with rapidly changing kidney function. According to the NKDEP, eGFR >89 is normal, 60-89 shows mild impairment, 30-59 shows moderate impairment, 15-29 shows severe impairment and <15 is ESRD.     GFR, Estimated  Date Value Ref Range Status  07/06/2021 >60 >60 mL/min Final    Comment:    (NOTE) Calculated  using the CKD-EPI Creatinine Equation (2021)    eGFR  Date Value Ref Range Status  12/25/2020 85 >59 mL/min/1.73 Final         Passed - Last BP in normal range    BP Readings from Last 1 Encounters:  10/09/21 126/86         Passed - Valid encounter within last 6 months    Recent Outpatient Visits          3 months ago Type 2 diabetes mellitus with diabetic polyneuropathy, without long-term current use of insulin [facility])   Primary Care at [facility], Amy J, NP   5 months ago Nonintractable headache, unspecified chronicity pattern, unspecified headache type   Primary Care at [facility], Amy J, NP   6 months ago Nonintractable headache, unspecified chronicity pattern, unspecified headache type   Primary Care at [facility], Kriste Basque, NP   7 months ago Type 2 diabetes mellitus with diabetic polyneuropathy, without long-term current use of insulin [facility])   Primary Care at [facility], Amy J, NP   10 months ago Type 2 diabetes mellitus with diabetic polyneuropathy, without long-term current use of insulin [facility])   Primary Care at [facility] [place]      Future Appointments            In 6 days Camillia Herter, NP Primary Care at [facility]           . atorvastatin (LIPITOR) 20 MG tablet 90 tablet 0    Sig: Take 1 tablet (20 mg total) by mouth daily.     Cardiovascular:  Antilipid - Statins Failed - 01/28/2022 10:25 PM      Failed - Lipid Panel in normal range within the last 12 months    Cholesterol, Total  Date Value Ref Range Status  10/25/2020 141 100 - 199 mg/dL Final   LDL Chol Calc (NIH)  Date Value Ref Range Status  10/25/2020 73 0 - 99 mg/dL Final   HDL  Date Value Ref Range Status  10/25/2020 53 >39 mg/dL Final   Triglycerides  Date Value Ref Range Status  10/25/2020 76 0 - 149 mg/dL  Final         Passed - Patient is not pregnant      Passed - Valid encounter within last 12 months     Recent Outpatient Visits          3 months ago Type 2 diabetes mellitus with diabetic polyneuropathy, without long-term current use of insulin [facility])   Primary Care at [facility], Amy J, NP   5 months ago Nonintractable headache, unspecified chronicity pattern, unspecified headache type   Primary Care at [facility], Amy J, NP   6 months ago Nonintractable headache, unspecified chronicity pattern, unspecified headache type   Primary Care at [facility], Kriste Basque, NP   7 months ago Type 2 diabetes mellitus with diabetic polyneuropathy, without long-term current use of insulin [facility])   Primary Care at [facility], Amy J, NP   10 months ago Type 2 diabetes mellitus with diabetic polyneuropathy, without long-term current use of insulin [facility])   Primary Care at [facility] [place]      Future Appointments            In 6 days Camillia Herter, NP Primary Care at [facility]

## 2022-02-05 ENCOUNTER — Encounter: Payer: Medicare Other | Admitting: Family

## 2022-02-05 DIAGNOSIS — E1142 Type 2 diabetes mellitus with diabetic polyneuropathy: Secondary | ICD-10-CM

## 2022-02-12 DIAGNOSIS — I420 Dilated cardiomyopathy: Secondary | ICD-10-CM | POA: Diagnosis not present

## 2022-02-12 DIAGNOSIS — I10 Essential (primary) hypertension: Secondary | ICD-10-CM | POA: Diagnosis not present

## 2022-02-12 DIAGNOSIS — I5020 Unspecified systolic (congestive) heart failure: Secondary | ICD-10-CM | POA: Diagnosis not present

## 2022-02-12 DIAGNOSIS — R002 Palpitations: Secondary | ICD-10-CM | POA: Diagnosis not present

## 2022-02-14 DIAGNOSIS — I420 Dilated cardiomyopathy: Secondary | ICD-10-CM | POA: Diagnosis not present

## 2022-02-14 DIAGNOSIS — I509 Heart failure, unspecified: Secondary | ICD-10-CM | POA: Diagnosis not present

## 2022-02-14 DIAGNOSIS — E785 Hyperlipidemia, unspecified: Secondary | ICD-10-CM | POA: Diagnosis not present

## 2022-02-14 DIAGNOSIS — I10 Essential (primary) hypertension: Secondary | ICD-10-CM | POA: Diagnosis not present

## 2022-02-14 DIAGNOSIS — E119 Type 2 diabetes mellitus without complications: Secondary | ICD-10-CM | POA: Diagnosis not present

## 2022-02-24 ENCOUNTER — Other Ambulatory Visit: Payer: Self-pay | Admitting: Family

## 2022-02-24 DIAGNOSIS — E1142 Type 2 diabetes mellitus with diabetic polyneuropathy: Secondary | ICD-10-CM

## 2022-02-25 ENCOUNTER — Other Ambulatory Visit: Payer: Self-pay

## 2022-02-25 MED ORDER — METFORMIN HCL 1000 MG PO TABS
1000.0000 mg | ORAL_TABLET | Freq: Two times a day (BID) | ORAL | 0 refills | Status: DC
  Filled 2022-02-25: qty 180, 90d supply, fill #0
  Filled 2022-03-28: qty 180, 90d supply, fill #1
  Filled 2022-04-25: qty 60, 30d supply, fill #1
  Filled 2022-04-29: qty 180, 90d supply, fill #1

## 2022-02-25 MED ORDER — GLIPIZIDE ER 10 MG PO TB24
20.0000 mg | ORAL_TABLET | Freq: Every day | ORAL | 0 refills | Status: DC
  Filled 2022-02-25: qty 180, 90d supply, fill #0
  Filled 2022-03-28 – 2022-04-25 (×2): qty 60, 30d supply, fill #1
  Filled 2022-04-29: qty 180, 90d supply, fill #1

## 2022-02-25 MED ORDER — EMPAGLIFLOZIN 10 MG PO TABS
10.0000 mg | ORAL_TABLET | Freq: Every day | ORAL | 0 refills | Status: DC
  Filled 2022-02-25: qty 30, 30d supply, fill #0

## 2022-02-26 ENCOUNTER — Other Ambulatory Visit: Payer: Self-pay

## 2022-02-28 ENCOUNTER — Other Ambulatory Visit: Payer: Self-pay

## 2022-03-04 ENCOUNTER — Other Ambulatory Visit: Payer: Self-pay

## 2022-03-08 ENCOUNTER — Other Ambulatory Visit: Payer: Self-pay

## 2022-03-28 ENCOUNTER — Other Ambulatory Visit: Payer: Self-pay | Admitting: Family

## 2022-03-28 ENCOUNTER — Other Ambulatory Visit: Payer: Self-pay

## 2022-03-28 MED ORDER — ATORVASTATIN CALCIUM 20 MG PO TABS
20.0000 mg | ORAL_TABLET | Freq: Every day | ORAL | 0 refills | Status: DC
Start: 2022-03-28 — End: 2022-07-25
  Filled 2022-03-28 – 2022-05-25 (×4): qty 90, 90d supply, fill #0

## 2022-04-09 ENCOUNTER — Other Ambulatory Visit: Payer: Self-pay | Admitting: *Deleted

## 2022-04-09 NOTE — Patient Outreach (Signed)
  Care Coordination   04/09/2022 Name: Devondre Guzzetta MRN: [id number] DOB: [birth year]   Care Coordination Outreach Attempts:  An unsuccessful telephone outreach was attempted today to offer the patient information about available care coordination services as a benefit of their health plan.   Follow Up Plan:  No further outreach attempts will be made at this time. We have been unable to contact the patient to offer or enroll patient in care coordination services  Encounter Outcome:  Pt. Refused  Care Coordination Interventions Activated:  No   Care Coordination Interventions:  No, not indicated    Blanchie Serve RN, BSN [facility] Care Management [facility] [phone] [email or web address]

## 2022-04-25 ENCOUNTER — Other Ambulatory Visit: Payer: Self-pay | Admitting: Family

## 2022-04-25 ENCOUNTER — Other Ambulatory Visit: Payer: Self-pay

## 2022-04-25 DIAGNOSIS — I5022 Chronic systolic (congestive) heart failure: Secondary | ICD-10-CM

## 2022-04-25 DIAGNOSIS — I10 Essential (primary) hypertension: Secondary | ICD-10-CM

## 2022-04-26 ENCOUNTER — Other Ambulatory Visit: Payer: Self-pay

## 2022-04-26 MED ORDER — SPIRONOLACTONE 25 MG PO TABS
25.0000 mg | ORAL_TABLET | Freq: Every day | ORAL | 0 refills | Status: DC
  Filled 2022-04-26: qty 90, 90d supply, fill #0

## 2022-04-26 MED ORDER — CARVEDILOL 12.5 MG PO TABS
12.5000 mg | ORAL_TABLET | Freq: Two times a day (BID) | ORAL | 0 refills | Status: DC
  Filled 2022-04-26 (×2): qty 180, 90d supply, fill #0

## 2022-04-26 NOTE — Progress Notes (Unsigned)
Patient ID: Todd Greer, male    DOB: [birth year]  MRN: [id number]  CC: Chronic Care Management  Subjective: Todd Greer is a 66 y.o. male who presents for chronic care management.   His concerns today include:  DM Continue Metformin, Empagliflozin, Glipizide, and Gabapentin as prescribed.  HTN/CHF  Furosemide Spironolactone  Carvedilol  Cards referral needed   HLD  Atorvastatin    Patient Active Problem List   Diagnosis Date Noted   Nonintractable headache 07/18/2021   CHF, acute on chronic ([place]) 09/05/2017   Acute respiratory failure with hypoxia ([place]) 09/05/2017   Hypertrophy of prostate without urinary obstruction and other lower urinary tract symptoms (LUTS) 12/03/2013   Erectile dysfunction 08/20/2013   Personal history of colonic polyps 07/20/2013   Exertional dyspnea 06/01/2013    Class: Acute   Obesity (BMI 30-39.9) 06/01/2013   Abnormal resting ECG findings - bifascicular block with PVCs 06/01/2013   DM II (diabetes mellitus, type II), controlled ([place]) 05/28/2013   DYSLIPIDEMIA 02/11/2007   ANXIETY DISORDER 02/11/2007   ALCOHOL USE 02/11/2007   Essential hypertension 02/11/2007     Current Outpatient Medications on File Prior to Visit  Medication Sig Dispense Refill   aspirin 81 MG tablet Take 81 mg by mouth daily.     atorvastatin (LIPITOR) 20 MG tablet Take 1 tablet (20 mg total) by mouth daily. 90 tablet 0   Blood Glucose Monitoring Suppl (ONE TOUCH ULTRA 2) w/Device KIT 1 Device by Does not apply route 4 (four) times daily -  before meals and at bedtime. 1 kit 0   carvedilol (COREG) 12.5 MG tablet Take 1 tablet (12.5 mg total) by mouth 2 (two) times daily. 180 tablet 0   cyclobenzaprine (FLEXERIL) 5 MG tablet Take 1 tablet (5 mg total) by mouth 3 (three) times daily as needed for muscle spasms. 30 tablet 0   empagliflozin (JARDIANCE) 10 MG TABS tablet Take 1 tablet (10 mg total) by mouth daily before breakfast. 120 tablet 0   furosemide (LASIX) 40 MG  tablet TAKE 1 TABLET (40 MG TOTAL) BY MOUTH DAILY. (Patient not taking: Reported on 06/08/2021) 30 tablet 2   gabapentin (NEURONTIN) 100 MG capsule Take 1 capsule (100 mg total) by mouth at bedtime. 30 capsule 3   glipiZIDE (GLIPIZIDE XL) 10 MG 24 hr tablet Take 2 tablets (20 mg total) by mouth daily. 240 tablet 0   glucose blood (ONETOUCH ULTRA) test strip Check blood sugars 4 times a day before and after meals 100 each 12   metFORMIN (GLUCOPHAGE) 1000 MG tablet Take 1 tablet (1,000 mg total) by mouth 2 (two) times daily with a meal. 240 tablet 0   Multiple Vitamin (ONE-A-DAY MENS PO) Take 1 tablet by mouth daily.     OneTouch Delica Lancets 69S MISC Check blood sugars 4 times a day before and after meals 100 each 12   spironolactone (ALDACTONE) 25 MG tablet Take 1 tablet (25 mg total) by mouth daily. 90 tablet 0   No current facility-administered medications on file prior to visit.    Allergies  Allergen Reactions   Ace Inhibitors Cough    Tolerates ARB   Amlodipine Other (See Comments)    Fatigue    Social History   Socioeconomic History   Marital status: Married    Spouse name: Not on file   Number of children: Not on file   Years of education: Not on file   Highest education level: Not on file  Occupational History  Not on file  Tobacco Use   Smoking status: Former    Packs/day: 0.50    Years: 10.00    Total pack years: 5.00    Types: Cigarettes    Quit date: 10/04/1983    Years since quitting: 38.5   Smokeless tobacco: Never  Vaping Use   Vaping Use: Never used  Substance and Sexual Activity   Alcohol use: Yes    Alcohol/week: 6.0 standard drinks of alcohol    Types: 6 Standard drinks or equivalent per week   Drug use: No   Sexual activity: Yes  Other Topics Concern   Not on file  Social History Narrative   His married father of 4.  His married for 28 years.  He lives with his wife and daughters.  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  Education: [employer].   He quit smoking in 1985.  He drinks 3 beers a week.   Prior to the onset of his current symptoms, used to work out with weights for at least 40 minutes a day for 3 times a week.  He usually did light weights for many occasions as opposed to heavy weights.      Contacts: Wife Nicki Reaper; daughter Sandi Mealy   Social Determinants of Health   Financial Resource Strain: Not on file  Food Insecurity: Not on file  Transportation Needs: Not on file  Physical Activity: Not on file  Stress: Not on file  Social Connections: Not on file  Intimate Partner Violence: Not on file    Family History  Problem Relation Age of Onset   Stroke Father    Diabetes Father    Hypertension Father    Cancer Sister    Cerebral aneurysm Mother        Died at a young age.   Diabetes Paternal Grandmother    Hypertension Paternal Grandmother    Stroke Paternal Grandmother     Past Surgical History:  Procedure Laterality Date   LEFT HEART CATH AND CORONARY ANGIOGRAPHY N/A 09/09/2017   Procedure: LEFT HEART CATH AND CORONARY ANGIOGRAPHY;  Surgeon: Charolette Forward, MD;  Location: [facility];  Service: Cardiovascular;  Laterality: N/A;   NM  EXERCISE MYOVIEW LTD  06-2013   No ischemia or infarction; note dated images    ROS: Review of Systems Negative except as stated above  PHYSICAL EXAM: There were no vitals taken for this visit.  Physical Exam  {male adult master:310786} {male adult master:310785}     Latest Ref Rng & Units 07/06/2021    8:02 PM 12/25/2020   11:30 AM 10/25/2020    9:15 AM  CMP  Glucose 70 - 99 mg/dL 79  92  129   BUN 8 - 23 mg/dL _0 Creatinine 0.61 - 1.24 mg/dL 0.87  0.99  1.15   Sodium 135 - 145 mmol/L 136  139  134   Potassium 3.5 - 5.1 mmol/L 4.5  4.8  4.9   Chloride 98 - 111 mmol/L 104  103  98   CO2 22 - 32 mmol/L _1 Calcium 8.9 - 10.3 mg/dL 9.5  9.4  9.8   Total Protein 6.0 - 8.5 g/dL  7.3  7.5    Total Bilirubin 0.0 - 1.2 mg/dL  0.4  0.5   Alkaline Phos 44 - 121 IU/L  56  49   AST 0 - 40 IU/L  57  71   ALT 0 - 44 IU/L  61  78    Lipid Panel     Component Value Date/Time   CHOL 141 10/25/2020 0915   TRIG 76 10/25/2020 0915   HDL 53 10/25/2020 0915   CHOLHDL 2.7 10/25/2020 0915   CHOLHDL 3.8 09/08/2017 0437   VLDL 16 09/08/2017 0437   LDLCALC 73 10/25/2020 0915    CBC    Component Value Date/Time   WBC 5.4 07/06/2021 2002   RBC 5.22 07/06/2021 2002   HGB 14.5 07/06/2021 2002   HGB 15.3 10/25/2020 0915   HCT 43.4 07/06/2021 2002   HCT 46.7 10/25/2020 0915   PLT 248 07/06/2021 2002   PLT 237 10/25/2020 0915   MCV 83.1 07/06/2021 2002   MCV 85 10/25/2020 0915   MCH 27.8 07/06/2021 2002   MCHC 33.4 07/06/2021 2002   RDW 14.1 07/06/2021 2002   RDW 13.3 10/25/2020 0915   LYMPHSABS 2.5 07/06/2021 2002   MONOABS 0.9 07/06/2021 2002   EOSABS 0.2 07/06/2021 2002   BASOSABS 0.1 07/06/2021 2002    ASSESSMENT AND PLAN:  There are no diagnoses linked to this encounter.   Patient was given the opportunity to ask questions.  Patient verbalized understanding of the plan and was able to repeat key elements of the plan. Patient was given clear instructions to go to Emergency Department or return to medical center if symptoms don't improve, worsen, or new problems develop.The patient verbalized understanding.   No orders of the defined types were placed in this encounter.    Requested Prescriptions    No prescriptions requested or ordered in this encounter    No follow-ups on file.  Camillia Herter, NP

## 2022-04-26 NOTE — Telephone Encounter (Signed)
Called, spoke with pt and scheduled OV. Will refill medication.  Requested Prescriptions  Pending Prescriptions Disp Refills  . carvedilol (COREG) 12.5 MG tablet 180 tablet 0    Sig: Take 1 tablet (12.5 mg total) by mouth 2 (two) times daily.     Cardiovascular: Beta Blockers 3 Failed - 04/25/2022  4:09 PM      Failed - AST in normal range and within 360 days    AST  Date Value Ref Range Status  12/25/2020 57 (H) 0 - 40 IU/L Final         Failed - ALT in normal range and within 360 days    ALT  Date Value Ref Range Status  12/25/2020 61 (H) 0 - 44 IU/L Final         Failed - Valid encounter within last 6 months    Recent Outpatient Visits          6 months ago Type 2 diabetes mellitus with diabetic polyneuropathy, without long-term current use of insulin [facility])   Primary Care at [facility], Amy J, NP   8 months ago Nonintractable headache, unspecified chronicity pattern, unspecified headache type   Primary Care at [facility], Amy J, NP   9 months ago Nonintractable headache, unspecified chronicity pattern, unspecified headache type   Primary Care at [facility], Kriste Basque, NP   10 months ago Type 2 diabetes mellitus with diabetic polyneuropathy, without long-term current use of insulin ([place])   Primary Care at [facility], Amy J, NP   1 year ago Type 2 diabetes mellitus with diabetic polyneuropathy, without long-term current use of insulin [facility])   Primary Care at [facility], FNP      Future Appointments            In 3 days Camillia Herter, NP Primary Care at [facility] - Cr in normal range and within 360 days    Creat  Date Value Ref Range Status  06/03/2014 0.86 0.50 - 1.35 mg/dL Final   Creatinine, Ser  Date Value Ref Range Status  07/06/2021 0.87 0.61 - 1.24 mg/dL Final         Passed - Last BP in normal range    BP Readings from Last 1 Encounters:  10/09/21 126/86          Passed - Last Heart Rate in normal range    Pulse Readings from Last 1 Encounters:  10/09/21 68         . spironolactone (ALDACTONE) 25 MG tablet 30 tablet 2    Sig: Take 1 tablet (25 mg total) by mouth daily.     Cardiovascular: Diuretics - Aldosterone Antagonist Failed - 04/25/2022  4:09 PM      Failed - Cr in normal range and within 180 days    Creat  Date Value Ref Range Status  06/03/2014 0.86 0.50 - 1.35 mg/dL Final   Creatinine, Ser  Date Value Ref Range Status  07/06/2021 0.87 0.61 - 1.24 mg/dL Final         Failed - K in normal range and within 180 days    Potassium  Date Value Ref Range Status  07/06/2021 4.5 3.5 - 5.1 mmol/L Final         Failed - Na in normal range and within 180 days    Sodium  Date Value Ref Range Status  07/06/2021 136  135 - 145 mmol/L Final  12/25/2020 139 134 - 144 mmol/L Final         Failed - eGFR is 30 or above and within 180 days    GFR, Est African American  Date Value Ref Range Status  06/03/2014 >89 mL/min Final   GFR calc Af Amer  Date Value Ref Range Status  10/25/2020 77 >59 mL/min/1.73 Final    Comment:    **In accordance with recommendations from the NKF-ASN Task force,**   [facility] is in the process of updating its eGFR calculation to the   2021 CKD-EPI creatinine equation that estimates kidney function   without a race variable.    GFR, Est Non African American  Date Value Ref Range Status  06/03/2014 >89 mL/min Final    Comment:      The estimated GFR is a calculation valid for adults (>=49 years old) that uses the CKD-EPI algorithm to adjust for age and sex. It is   not to be used for children, pregnant women, hospitalized patients,    patients on dialysis, or with rapidly changing kidney function. According to the NKDEP, eGFR >89 is normal, 60-89 shows mild impairment, 30-59 shows moderate impairment, 15-29 shows severe impairment and <15 is ESRD.     GFR, Estimated  Date Value Ref Range Status   07/06/2021 >60 >60 mL/min Final    Comment:    (NOTE) Calculated using the CKD-EPI Creatinine Equation (2021)    eGFR  Date Value Ref Range Status  12/25/2020 85 >59 mL/min/1.73 Final         Failed - Valid encounter within last 6 months    Recent Outpatient Visits          6 months ago Type 2 diabetes mellitus with diabetic polyneuropathy, without long-term current use of insulin [facility])   Primary [facility] at [facility], Amy J, NP   8 months ago Nonintractable headache, unspecified chronicity pattern, unspecified headache type   Primary Care at [facility], Amy J, NP   9 months ago Nonintractable headache, unspecified chronicity pattern, unspecified headache type   Primary Care at [facility], Kriste Basque, NP   10 months ago Type 2 diabetes mellitus with diabetic polyneuropathy, without long-term current use of insulin [facility])   Primary Care at [facility], Amy J, NP   1 year ago Type 2 diabetes mellitus with diabetic polyneuropathy, without long-term current use of insulin [facility])   Primary Care at [facility] [place]      Future Appointments            In 3 days Camillia Herter, NP Primary Care at [place] BP in normal range    BP Readings from Last 1 Encounters:  10/09/21 126/86

## 2022-04-29 ENCOUNTER — Other Ambulatory Visit: Payer: Self-pay

## 2022-04-29 ENCOUNTER — Ambulatory Visit (INDEPENDENT_AMBULATORY_CARE_PROVIDER_SITE_OTHER): Payer: Medicare Other | Admitting: Family

## 2022-04-29 VITALS — BP 118/84 | HR 73 | Temp 98.3°F | Resp 18 | Wt 205.0 lb

## 2022-04-29 DIAGNOSIS — I10 Essential (primary) hypertension: Secondary | ICD-10-CM

## 2022-04-29 DIAGNOSIS — E785 Hyperlipidemia, unspecified: Secondary | ICD-10-CM | POA: Diagnosis not present

## 2022-04-29 DIAGNOSIS — E1142 Type 2 diabetes mellitus with diabetic polyneuropathy: Secondary | ICD-10-CM | POA: Diagnosis not present

## 2022-04-29 DIAGNOSIS — I509 Heart failure, unspecified: Secondary | ICD-10-CM | POA: Diagnosis not present

## 2022-04-29 LAB — POCT GLYCOSYLATED HEMOGLOBIN (HGB A1C): Hemoglobin A1C: 6.6 % — AB (ref 4.0–5.6)

## 2022-04-29 MED ORDER — EMPAGLIFLOZIN 10 MG PO TABS
10.0000 mg | ORAL_TABLET | Freq: Every day | ORAL | 2 refills | Status: AC
  Filled 2022-04-29 – 2022-07-01 (×3): qty 30, 30d supply, fill #0

## 2022-04-29 MED ORDER — GLIPIZIDE ER 10 MG PO TB24
20.0000 mg | ORAL_TABLET | Freq: Every day | ORAL | 2 refills | Status: DC
  Filled 2022-04-29 – 2022-05-27 (×3): qty 60, 30d supply, fill #0
  Filled 2022-06-23 – 2022-07-01 (×2): qty 60, 30d supply, fill #1
  Filled 2022-07-25: qty 60, 30d supply, fill #2

## 2022-04-29 MED ORDER — METFORMIN HCL 1000 MG PO TABS
1000.0000 mg | ORAL_TABLET | Freq: Two times a day (BID) | ORAL | 2 refills | Status: DC
  Filled 2022-04-29 – 2022-05-25 (×2): qty 60, 30d supply, fill #0
  Filled 2022-06-23 – 2022-07-01 (×2): qty 60, 30d supply, fill #1
  Filled 2022-07-25: qty 60, 30d supply, fill #2

## 2022-04-29 MED ORDER — GABAPENTIN 100 MG PO CAPS
100.0000 mg | ORAL_CAPSULE | Freq: Every day | ORAL | 2 refills | Status: DC
  Filled 2022-05-25: qty 30, 30d supply, fill #0

## 2022-04-29 NOTE — Progress Notes (Signed)
.  Pt presents for chronic care management   

## 2022-04-30 LAB — CMP14+EGFR
ALT: 41 IU/L (ref 0–44)
AST: 43 IU/L — ABNORMAL HIGH (ref 0–40)
Albumin/Globulin Ratio: 1.3 (ref 1.2–2.2)
Albumin: 4 g/dL (ref 3.9–4.9)
Alkaline Phosphatase: 45 IU/L (ref 44–121)
BUN/Creatinine Ratio: 15 (ref 10–24)
BUN: 16 mg/dL (ref 8–27)
Bilirubin Total: 0.5 mg/dL (ref 0.0–1.2)
CO2: 20 mmol/L (ref 20–29)
Calcium: 9.8 mg/dL (ref 8.6–10.2)
Chloride: 103 mmol/L (ref 96–106)
Creatinine, Ser: 1.05 mg/dL (ref 0.76–1.27)
Globulin, Total: 3.2 g/dL (ref 1.5–4.5)
Glucose: 122 mg/dL — ABNORMAL HIGH (ref 70–99)
Potassium: 4.5 mmol/L (ref 3.5–5.2)
Sodium: 137 mmol/L (ref 134–144)
Total Protein: 7.2 g/dL (ref 6.0–8.5)
eGFR: 79 mL/min/{1.73_m2} (ref >59)

## 2022-05-03 ENCOUNTER — Other Ambulatory Visit: Payer: Self-pay

## 2022-05-22 DIAGNOSIS — I5020 Unspecified systolic (congestive) heart failure: Secondary | ICD-10-CM | POA: Diagnosis not present

## 2022-05-22 DIAGNOSIS — E119 Type 2 diabetes mellitus without complications: Secondary | ICD-10-CM | POA: Diagnosis not present

## 2022-05-22 DIAGNOSIS — I10 Essential (primary) hypertension: Secondary | ICD-10-CM | POA: Diagnosis not present

## 2022-05-22 DIAGNOSIS — E785 Hyperlipidemia, unspecified: Secondary | ICD-10-CM | POA: Diagnosis not present

## 2022-05-22 DIAGNOSIS — I420 Dilated cardiomyopathy: Secondary | ICD-10-CM | POA: Diagnosis not present

## 2022-05-25 ENCOUNTER — Other Ambulatory Visit: Payer: Self-pay | Admitting: Family

## 2022-05-25 DIAGNOSIS — I10 Essential (primary) hypertension: Secondary | ICD-10-CM

## 2022-05-25 DIAGNOSIS — I5022 Chronic systolic (congestive) heart failure: Secondary | ICD-10-CM

## 2022-05-27 ENCOUNTER — Other Ambulatory Visit: Payer: Self-pay

## 2022-05-27 NOTE — Telephone Encounter (Signed)
Called to see if pt only picked up 30 day supply since she is requesting and just filled recently. Pharm states she got 90 day supplies on both meds.

## 2022-05-27 NOTE — Telephone Encounter (Signed)
Requested Prescriptions  Refused Prescriptions Disp Refills  . carvedilol (COREG) 12.5 MG tablet 180 tablet 0    Sig: Take 1 tablet (12.5 mg total) by mouth 2 (two) times daily.     Cardiovascular: Beta Blockers 3 Failed - 05/25/2022  7:44 PM      Failed - AST in normal range and within 360 days    AST  Date Value Ref Range Status  04/29/2022 43 (H) 0 - 40 IU/L Final         Passed - Cr in normal range and within 360 days    Creat  Date Value Ref Range Status  06/03/2014 0.86 0.50 - 1.35 mg/dL Final   Creatinine, Ser  Date Value Ref Range Status  04/29/2022 1.05 0.76 - 1.27 mg/dL Final         Passed - ALT in normal range and within 360 days    ALT  Date Value Ref Range Status  04/29/2022 41 0 - 44 IU/L Final         Passed - Last BP in normal range    BP Readings from Last 1 Encounters:  04/29/22 118/84         Passed - Last Heart Rate in normal range    Pulse Readings from Last 1 Encounters:  04/29/22 73         Passed - Valid encounter within last 6 months    Recent Outpatient Visits          4 weeks ago Type 2 diabetes mellitus with diabetic polyneuropathy, without long-term current use of insulin ([place])   Primary Care at [facility], Amy J, NP   7 months ago Type 2 diabetes mellitus with diabetic polyneuropathy, without long-term current use of insulin [facility])   Primary Care at [facility], Amy J, NP   9 months ago Nonintractable headache, unspecified chronicity pattern, unspecified headache type   Primary Care at [facility], Amy J, NP   10 months ago Nonintractable headache, unspecified chronicity pattern, unspecified headache type   Primary Care at [facility], Kriste Basque, NP   11 months ago Type 2 diabetes mellitus with diabetic polyneuropathy, without long-term current use of insulin [facility])   Primary Care at [facility], Flonnie Hailstone, NP      Future Appointments            In 2 months Camillia Herter, NP  Primary Care at [facility]           . spironolactone (ALDACTONE) 25 MG tablet 90 tablet 0    Sig: Take 1 tablet (25 mg total) by mouth daily.     Cardiovascular: Diuretics - Aldosterone Antagonist Passed - 05/25/2022  7:44 PM      Passed - Cr in normal range and within 180 days    Creat  Date Value Ref Range Status  06/03/2014 0.86 0.50 - 1.35 mg/dL Final   Creatinine, Ser  Date Value Ref Range Status  04/29/2022 1.05 0.76 - 1.27 mg/dL Final         Passed - K in normal range and within 180 days    Potassium  Date Value Ref Range Status  04/29/2022 4.5 3.5 - 5.2 mmol/L Final         Passed - Na in normal range and within 180 days    Sodium  Date Value Ref Range Status  04/29/2022 137 134 - 144 mmol/L Final  Passed - eGFR is 30 or above and within 180 days    GFR, Est African American  Date Value Ref Range Status  06/03/2014 >89 mL/min Final   GFR calc Af Amer  Date Value Ref Range Status  10/25/2020 77 >59 mL/min/1.73 Final    Comment:    **In accordance with recommendations from the NKF-ASN Task force,**   [facility] is in the process of updating its eGFR calculation to the   2021 CKD-EPI creatinine equation that estimates kidney function   without a race variable.    GFR, Est Non African American  Date Value Ref Range Status  06/03/2014 >89 mL/min Final    Comment:      The estimated GFR is a calculation valid for adults (>=51 years old) that uses the CKD-EPI algorithm to adjust for age and sex. It is   not to be used for children, pregnant women, hospitalized patients,    patients on dialysis, or with rapidly changing kidney function. According to the NKDEP, eGFR >89 is normal, 60-89 shows mild impairment, 30-59 shows moderate impairment, 15-29 shows severe impairment and <15 is ESRD.     GFR, Estimated  Date Value Ref Range Status  07/06/2021 >60 >60 mL/min Final    Comment:    (NOTE) Calculated using the CKD-EPI Creatinine Equation  (2021)    eGFR  Date Value Ref Range Status  04/29/2022 79 >59 mL/min/1.73 Final         Passed - Last BP in normal range    BP Readings from Last 1 Encounters:  04/29/22 118/84         Passed - Valid encounter within last 6 months    Recent Outpatient Visits          4 weeks ago Type 2 diabetes mellitus with diabetic polyneuropathy, without long-term current use of insulin [facility])   Primary Care at [facility], Amy J, NP   7 months ago Type 2 diabetes mellitus with diabetic polyneuropathy, without long-term current use of insulin [facility])   Primary Care at [facility], Amy J, NP   9 months ago Nonintractable headache, unspecified chronicity pattern, unspecified headache type   Primary Care at [facility], Amy J, NP   10 months ago Nonintractable headache, unspecified chronicity pattern, unspecified headache type   Primary Care at [facility], Kriste Basque, NP   11 months ago Type 2 diabetes mellitus with diabetic polyneuropathy, without long-term current use of insulin [facility])   Primary Care at [facility], Flonnie Hailstone, NP      Future Appointments            In 2 months Camillia Herter, NP Primary Care at [facility]

## 2022-05-31 ENCOUNTER — Other Ambulatory Visit: Payer: Self-pay

## 2022-06-23 ENCOUNTER — Other Ambulatory Visit: Payer: Self-pay | Admitting: Family

## 2022-06-23 DIAGNOSIS — I5022 Chronic systolic (congestive) heart failure: Secondary | ICD-10-CM

## 2022-06-23 DIAGNOSIS — I10 Essential (primary) hypertension: Secondary | ICD-10-CM

## 2022-06-24 ENCOUNTER — Other Ambulatory Visit: Payer: Self-pay

## 2022-06-24 MED ORDER — SPIRONOLACTONE 25 MG PO TABS
25.0000 mg | ORAL_TABLET | Freq: Every day | ORAL | 0 refills | Status: DC
  Filled 2022-06-24: qty 90, 90d supply, fill #0
  Filled 2022-07-01: qty 30, 30d supply, fill #0
  Filled 2022-07-25: qty 90, 90d supply, fill #0

## 2022-06-24 MED ORDER — CARVEDILOL 12.5 MG PO TABS
12.5000 mg | ORAL_TABLET | Freq: Two times a day (BID) | ORAL | 0 refills | Status: DC
  Filled 2022-06-24: qty 180, 90d supply, fill #0
  Filled 2022-07-01: qty 60, 30d supply, fill #0
  Filled 2022-07-25: qty 180, 90d supply, fill #0

## 2022-07-01 ENCOUNTER — Other Ambulatory Visit: Payer: Self-pay

## 2022-07-02 ENCOUNTER — Other Ambulatory Visit: Payer: Self-pay

## 2022-07-22 NOTE — [facility] Notes (Signed)
Patient ID: Todd Greer, male    DOB: Jan 25, 1956  MRN: [id number]  CC: Chronic Care Management   Subjective: Todd Greer is a 66 y.o. male who presents for chronic care management.   His concerns today include:  - Doing well on Metformin, Empagliflozin, Glipizide, and Gabapentin, no issues/concerns.  - Request new referral to cardiologist. He is a current patient of Charolette Forward, MD. Reports he is concerned about fluctuations of his heart rate and thinks may be related to being on a beta blocker . States his Apple Watch indicates his heart rate is in the 30's during nighttime. Reports Charolette Forward, MD has not answered his questions or addressed his concerns clearly as to why his heart rate fluctuates.    Patient Active Problem List   Diagnosis Date Noted   Nonintractable headache 07/18/2021   CHF, acute on chronic ([place]) 09/05/2017   Acute respiratory failure with hypoxia ([place]) 09/05/2017   Hypertrophy of prostate without urinary obstruction and other lower urinary tract symptoms (LUTS) 12/03/2013   Erectile dysfunction 08/20/2013   Personal history of colonic polyps 07/20/2013   Exertional dyspnea 06/01/2013    Class: Acute   Obesity (BMI 30-39.9) 06/01/2013   Abnormal resting ECG findings - bifascicular block with PVCs 06/01/2013   DM II (diabetes mellitus, type II), controlled ([place]) 05/28/2013   DYSLIPIDEMIA 02/11/2007   ANXIETY DISORDER 02/11/2007   ALCOHOL USE 02/11/2007   Essential hypertension 02/11/2007     Current Outpatient Medications on File Prior to Visit  Medication Sig Dispense Refill   ENTRESTO 49-51 MG Take 1 tablet by mouth 2 (two) times daily.     aspirin 81 MG tablet Take 81 mg by mouth daily.     atorvastatin (LIPITOR) 20 MG tablet Take 1 tablet (20 mg total) by mouth daily. 90 tablet 0   Blood Glucose Monitoring Suppl (ONE TOUCH ULTRA 2) w/Device KIT 1 Device by Does not apply route 4 (four) times daily -  before meals and at bedtime. 1 kit 0    carvedilol (COREG) 12.5 MG tablet Take 1 tablet (12.5 mg total) by mouth 2 (two) times daily. 180 tablet 0   glucose blood (ONETOUCH ULTRA) test strip Check blood sugars 4 times a day before and after meals 100 each 12   Multiple Vitamin (ONE-A-DAY MENS PO) Take 1 tablet by mouth daily.     OneTouch Delica Lancets 50T MISC Check blood sugars 4 times a day before and after meals 100 each 12   spironolactone (ALDACTONE) 25 MG tablet Take 1 tablet (25 mg total) by mouth daily. 90 tablet 0   No current facility-administered medications on file prior to visit.    Allergies  Allergen Reactions   Ace Inhibitors Cough    Tolerates ARB   Amlodipine Other (See Comments)    Fatigue    Social History   Socioeconomic History   Marital status: Married    Spouse name: Not on file   Number of children: Not on file   Years of education: Not on file   Highest education level: Not on file  Occupational History   Not on file  Tobacco Use   Smoking status: Former    Packs/day: 0.50    Years: 10.00    Total pack years: 5.00    Types: Cigarettes    Quit date: 10/04/1983    Years since quitting: 38.8    Passive exposure: Past   Smokeless tobacco: Never  Vaping Use   Vaping Use:  Never used  Substance and Sexual Activity   Alcohol use: Yes    Alcohol/week: 6.0 standard drinks of alcohol    Types: 6 Standard drinks or equivalent per week   Drug use: No   Sexual activity: Yes  Other Topics Concern   Not on file  Social History Narrative   His married father of 4.  His married for 28 years.  He lives with his wife and daughters.  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  Education: [employer].   He quit smoking in 1985.  He drinks 3 beers a week.   Prior to the onset of his current symptoms, used to work out with weights for at least 40 minutes a day for 3 times a week.  He usually did light weights for many occasions as opposed to heavy weights.       Contacts: Wife Nicki Reaper; daughter Sandi Mealy   Social Determinants of Health   Financial Resource Strain: Not on file  Food Insecurity: Not on file  Transportation Needs: Not on file  Physical Activity: Not on file  Stress: Not on file  Social Connections: Not on file  Intimate Partner Violence: Not on file    Family History  Problem Relation Age of Onset   Stroke Father    Diabetes Father    Hypertension Father    Cancer Sister    Cerebral aneurysm Mother        Died at a young age.   Diabetes Paternal Grandmother    Hypertension Paternal Grandmother    Stroke Paternal Grandmother     Past Surgical History:  Procedure Laterality Date   LEFT HEART CATH AND CORONARY ANGIOGRAPHY N/A 09/09/2017   Procedure: LEFT HEART CATH AND CORONARY ANGIOGRAPHY;  Surgeon: Charolette Forward, MD;  Location: [place] [facility];  Service: Cardiovascular;  Laterality: N/A;   NM  EXERCISE MYOVIEW LTD  06-2013   No ischemia or infarction; note dated images    ROS: Review of Systems Negative except as stated above  PHYSICAL EXAM: BP 124/86 (BP Location: Left Arm, Patient Position: Sitting, Cuff Size: Large)   Pulse 82   Temp 98.4 F (36.9 C)   Resp 16   Ht 5' 7.01" (1.702 m)   Wt 208 lb (94.3 kg)   SpO2 94%   BMI 32.57 kg/m   Physical Exam HENT:     Head: Normocephalic and atraumatic.  Eyes:     Extraocular Movements: Extraocular movements intact.     Conjunctiva/sclera: Conjunctivae normal.     Pupils: Pupils are equal, round, and reactive to light.  Cardiovascular:     Rate and Rhythm: Normal rate and regular rhythm.     Pulses: Normal pulses.     Heart sounds: Normal heart sounds.  Pulmonary:     Effort: Pulmonary effort is normal.     Breath sounds: Normal breath sounds.  Musculoskeletal:     Cervical back: Normal range of motion and neck supple.  Neurological:     General: No focal deficit present.     Mental Status: He is alert and oriented to person, place, and time.   Psychiatric:        Mood and Affect: Mood normal.        Behavior: Behavior normal.    Results for orders placed or performed in visit on 07/29/22  POCT glycosylated hemoglobin (Hb A1C)  Result Value Ref Range   Hemoglobin A1C 6.6 (A) 4.0 -  5.6 %   HbA1c POC (<> result, manual entry)     HbA1c, POC (prediabetic range)     HbA1c, POC (controlled diabetic range)      ASSESSMENT AND PLAN: 1. Type 2 diabetes mellitus with diabetic polyneuropathy, without long-term current use of insulin (HCC) - Hemoglobin A1c at goal at 6.6%, goal 8%.  - Continue Empagliflozin, Glipizide, and Metformin as prescribed.  - Discussed the importance of healthy eating habits, low-carbohydrate diet, low-sugar diet, regular aerobic exercise (at least 150 minutes a week as tolerated) and medication compliance to achieve or maintain control of diabetes. - Follow-up with primary provider in 3 months or sooner if needed.  - POCT glycosylated hemoglobin (Hb A1C) - empagliflozin (JARDIANCE) 10 MG TABS tablet; Take 1 tablet (10 mg total) by mouth daily before breakfast.  Dispense: 30 tablet; Refill: 2 - glipiZIDE (GLIPIZIDE XL) 10 MG 24 hr tablet; Take 2 tablets (20 mg total) by mouth daily.  Dispense: 60 tablet; Refill: 2 - metFORMIN (GLUCOPHAGE) 1000 MG tablet; Take 1 tablet (1,000 mg total) by mouth 2 (two) times daily with a meal.  Dispense: 60 tablet; Refill: 2  2. Essential hypertension 3. Dilated cardiomyopathy ([place]) 4. Systolic congestive heart failure, unspecified HF chronicity ([place]) 5. Exertional dyspnea 6. Palpitations 7. Dyslipidemia - Per patient request referral to Cardiology for further evaluation, management, and second opinion. Discussed with patient to remain established with Charolette Forward, MD until established with a new cardiologist. Patient agreeable and verbalized understanding.  - Ambulatory referral to Cardiology  8. Need for immunization against influenza - Administered.  - Flu Vaccine  QUAD High Dose(Fluad)   Patient was given the opportunity to ask questions.  Patient verbalized understanding of the plan and was able to repeat key elements of the plan. Patient was given clear instructions to go to Emergency Department or return to medical center if symptoms don't improve, worsen, or new problems develop.The patient verbalized understanding.   Orders Placed This Encounter  Procedures   Flu Vaccine QUAD High Dose(Fluad)   Ambulatory referral to Cardiology   POCT glycosylated hemoglobin (Hb A1C)     Requested Prescriptions   Signed Prescriptions Disp Refills   empagliflozin (JARDIANCE) 10 MG TABS tablet 30 tablet 2    Sig: Take 1 tablet (10 mg total) by mouth daily before breakfast.   glipiZIDE (GLIPIZIDE XL) 10 MG 24 hr tablet 60 tablet 2    Sig: Take 2 tablets (20 mg total) by mouth daily.   metFORMIN (GLUCOPHAGE) 1000 MG tablet 60 tablet 2    Sig: Take 1 tablet (1,000 mg total) by mouth 2 (two) times daily with a meal.    Return in about 3 months (around 10/29/2022) for Follow-Up or next available chronic care mgmt .  Camillia Herter, NP

## 2022-07-25 ENCOUNTER — Other Ambulatory Visit: Payer: Self-pay | Admitting: Family

## 2022-07-26 ENCOUNTER — Other Ambulatory Visit: Payer: Self-pay

## 2022-07-26 NOTE — Telephone Encounter (Signed)
Requested medication (s) are due for refill today - yes  Requested medication (s) are on the active medication list -yes  Future visit scheduled -yes  Last refill: 03/28/22 #90  Notes to clinic: fails lab protocol- over 1 year-10/25/20  Requested Prescriptions  Pending Prescriptions Disp Refills   atorvastatin (LIPITOR) 20 MG tablet 90 tablet 0    Sig: Take 1 tablet (20 mg total) by mouth daily.     Cardiovascular:  Antilipid - Statins Failed - 07/25/2022 12:44 AM      Failed - Lipid Panel in normal range within the last 12 months    Cholesterol, Total  Date Value Ref Range Status  10/25/2020 141 100 - 199 mg/dL Final   LDL Chol Calc (NIH)  Date Value Ref Range Status  10/25/2020 73 0 - 99 mg/dL Final   HDL  Date Value Ref Range Status  10/25/2020 53 >39 mg/dL Final   Triglycerides  Date Value Ref Range Status  10/25/2020 76 0 - 149 mg/dL Final         Passed - Patient is not pregnant      Passed - Valid encounter within last 12 months    Recent Outpatient Visits           2 months ago Type 2 diabetes mellitus with diabetic polyneuropathy, without long-term current use of insulin (HCC)   Primary Care at [facility], Amy J, NP   9 months ago Type 2 diabetes mellitus with diabetic polyneuropathy, without long-term current use of insulin [facility])   Primary Care at [facility], Amy J, NP   11 months ago Nonintractable headache, unspecified chronicity pattern, unspecified headache type   Primary Care at [facility], Amy J, NP   1 year ago Nonintractable headache, unspecified chronicity pattern, unspecified headache type   Primary Care at [facility], Gildardo Pounds, NP   1 year ago Type 2 diabetes mellitus with diabetic polyneuropathy, without long-term current use of insulin [facility])   Primary Care at [place], [place], NP       Future Appointments             In 3 days Rema Fendt, NP Primary Care at [facility]               Requested Prescriptions  Pending Prescriptions Disp Refills   atorvastatin (LIPITOR) 20 MG tablet 90 tablet 0    Sig: Take 1 tablet (20 mg total) by mouth daily.     Cardiovascular:  Antilipid - Statins Failed - 07/25/2022 12:44 AM      Failed - Lipid Panel in normal range within the last 12 months    Cholesterol, Total  Date Value Ref Range Status  10/25/2020 141 100 - 199 mg/dL Final   LDL Chol Calc (NIH)  Date Value Ref Range Status  10/25/2020 73 0 - 99 mg/dL Final   HDL  Date Value Ref Range Status  10/25/2020 53 >39 mg/dL Final   Triglycerides  Date Value Ref Range Status  10/25/2020 76 0 - 149 mg/dL Final         Passed - Patient is not pregnant      Passed - Valid encounter within last 12 months    Recent Outpatient Visits           2 months ago Type 2 diabetes mellitus with diabetic polyneuropathy, without long-term current use of insulin [facility])   Primary Care at [facility],  Amy J, NP   9 months ago Type 2 diabetes mellitus with diabetic polyneuropathy, without long-term current use of insulin [facility])   Primary Care at [facility], Amy J, NP   11 months ago Nonintractable headache, unspecified chronicity pattern, unspecified headache type   Primary Care at [facility], Amy J, NP   1 year ago Nonintractable headache, unspecified chronicity pattern, unspecified headache type   Primary Care at [facility], Gildardo Pounds, NP   1 year ago Type 2 diabetes mellitus with diabetic polyneuropathy, without long-term current use of insulin [facility])   Primary Care at [facility], Salomon Fick, NP       Future Appointments             In 3 days Rema Fendt, NP Primary Care at [facility]

## 2022-07-29 ENCOUNTER — Encounter: Payer: Self-pay | Admitting: Family

## 2022-07-29 ENCOUNTER — Other Ambulatory Visit: Payer: Self-pay

## 2022-07-29 ENCOUNTER — Ambulatory Visit (INDEPENDENT_AMBULATORY_CARE_PROVIDER_SITE_OTHER): Payer: Medicare Other | Admitting: Family

## 2022-07-29 VITALS — BP 124/86 | HR 82 | Temp 98.4°F | Resp 16 | Ht 67.01 in | Wt 208.0 lb

## 2022-07-29 DIAGNOSIS — Z23 Encounter for immunization: Secondary | ICD-10-CM | POA: Diagnosis not present

## 2022-07-29 DIAGNOSIS — I5020 Unspecified systolic (congestive) heart failure: Secondary | ICD-10-CM

## 2022-07-29 DIAGNOSIS — R0609 Other forms of dyspnea: Secondary | ICD-10-CM | POA: Diagnosis not present

## 2022-07-29 DIAGNOSIS — E1142 Type 2 diabetes mellitus with diabetic polyneuropathy: Secondary | ICD-10-CM | POA: Diagnosis not present

## 2022-07-29 DIAGNOSIS — I420 Dilated cardiomyopathy: Secondary | ICD-10-CM

## 2022-07-29 DIAGNOSIS — E785 Hyperlipidemia, unspecified: Secondary | ICD-10-CM

## 2022-07-29 DIAGNOSIS — I10 Essential (primary) hypertension: Secondary | ICD-10-CM

## 2022-07-29 DIAGNOSIS — R002 Palpitations: Secondary | ICD-10-CM | POA: Diagnosis not present

## 2022-07-29 LAB — POCT GLYCOSYLATED HEMOGLOBIN (HGB A1C): Hemoglobin A1C: 6.6 % — AB (ref 4.0–5.6)

## 2022-07-29 MED ORDER — METFORMIN HCL 1000 MG PO TABS
1000.0000 mg | ORAL_TABLET | Freq: Two times a day (BID) | ORAL | 2 refills | Status: DC
  Filled 2022-07-29 – 2022-09-03 (×2): qty 60, 30d supply, fill #0
  Filled 2022-09-29: qty 60, 30d supply, fill #1
  Filled 2022-10-20: qty 60, 30d supply, fill #2
  Filled ????-??-??: fill #2

## 2022-07-29 MED ORDER — GLIPIZIDE ER 10 MG PO TB24
20.0000 mg | ORAL_TABLET | Freq: Every day | ORAL | 2 refills | Status: DC
  Filled 2022-07-29 – 2022-09-03 (×2): qty 60, 30d supply, fill #0
  Filled 2022-09-29: qty 60, 30d supply, fill #1
  Filled 2022-10-20: qty 60, 30d supply, fill #2
  Filled ????-??-??: fill #2

## 2022-07-29 MED ORDER — EMPAGLIFLOZIN 10 MG PO TABS
10.0000 mg | ORAL_TABLET | Freq: Every day | ORAL | 2 refills | Status: DC
  Filled 2022-07-29 – 2022-09-03 (×2): qty 30, 30d supply, fill #0
  Filled 2022-09-29: qty 30, 30d supply, fill #1

## 2022-07-29 NOTE — [facility] Notes (Signed)
.  Pt presents for chronic care management   -pt wants a new referral for Cardiology, states doesn't feel like current provider Harwani, MD knows why your heart rate keeps fluctuating -pt med list shows he is on beta blockers

## 2022-07-30 ENCOUNTER — Other Ambulatory Visit: Payer: Self-pay

## 2022-07-30 MED ORDER — ATORVASTATIN CALCIUM 20 MG PO TABS
20.0000 mg | ORAL_TABLET | Freq: Every day | ORAL | 0 refills | Status: DC
  Filled 2022-07-30 – 2022-09-29 (×2): qty 90, 90d supply, fill #0

## 2022-08-05 ENCOUNTER — Other Ambulatory Visit: Payer: Self-pay

## 2022-08-12 ENCOUNTER — Ambulatory Visit: Payer: Medicare Other | Attending: Cardiology | Admitting: Cardiology

## 2022-08-12 ENCOUNTER — Ambulatory Visit: Payer: Medicare Other | Attending: Cardiology

## 2022-08-12 VITALS — BP 124/80 | HR 81 | Ht 67.0 in | Wt 214.6 lb

## 2022-08-12 DIAGNOSIS — R0609 Other forms of dyspnea: Secondary | ICD-10-CM | POA: Diagnosis not present

## 2022-08-12 DIAGNOSIS — I428 Other cardiomyopathies: Secondary | ICD-10-CM | POA: Diagnosis not present

## 2022-08-12 DIAGNOSIS — I493 Ventricular premature depolarization: Secondary | ICD-10-CM | POA: Diagnosis not present

## 2022-08-12 DIAGNOSIS — R001 Bradycardia, unspecified: Secondary | ICD-10-CM | POA: Diagnosis not present

## 2022-08-12 DIAGNOSIS — I10 Essential (primary) hypertension: Secondary | ICD-10-CM

## 2022-08-12 DIAGNOSIS — I5042 Chronic combined systolic (congestive) and diastolic (congestive) heart failure: Secondary | ICD-10-CM | POA: Diagnosis not present

## 2022-08-12 NOTE — Patient Instructions (Addendum)
Medication Instructions:  No changes   *If you need a refill on your cardiac medications before your next appointment, please call your pharmacy*   Lab Work: Not needed    Testing/Procedures: Will be schedule at [facility] has requested that you have an echocardiogram. Echocardiography is a painless test that uses sound waves to create images of your heart. It provides your doctor with information about the size and shape of your heart and how well your heart's chambers and valves are working. This procedure takes approximately one hour. There are no restrictions for this procedure. Please do NOT wear cologne, perfume, aftershave, or lotions (deodorant is allowed). Please arrive 15 minutes prior to your appointment time.   And   The monitor will be mailed to you in 3 to 7 days to your home  Your physician has recommended that you wear a holter monitor 7 days [employer] . Holter monitors are medical devices that record the heart's electrical activity. Doctors most often use these monitors to diagnose arrhythmias. Arrhythmias are problems with the speed or rhythm of the heartbeat. The monitor is a small, portable device. You can wear one while you do your normal daily activities. This is usually used to diagnose what is causing palpitations/syncope (passing out).    Follow-Up: At [facility], you and your health needs are our priority.  As part of our continuing mission to provide you with exceptional heart care, we have created designated Provider Care Teams.  These Care Teams include your primary Cardiologist (physician) and Advanced Practice Providers (APPs -  Physician Assistants and Nurse Practitioners) who all work together to provide you with the care you need, when you need it.     Your next appointment:   2 month(s)  The format for your next appointment:   In person or virtual   Provider:   Glenetta Hew, MD    Other Instructions  Bryn Gulling- Long Term Monitor Instructions  Your physician has requested you wear a ZIO patch monitor for 14 days.  This is a single patch monitor. [employer] supplies one patch monitor per enrollment. Additional stickers are not available. Please do not apply patch if you will be having a Nuclear Stress Test,  Echocardiogram, Cardiac CT, MRI, or Chest Xray during the period you would be wearing the  monitor. The patch cannot be worn during these tests. You cannot remove and re-apply the  ZIO XT patch monitor.  Your ZIO patch monitor will be mailed 3 day USPS to your address on file. It may take 3-5 days  to receive your monitor after you have been enrolled.  Once you have received your monitor, please review the enclosed instructions. Your monitor  has already been registered assigning a specific monitor serial # to you.  Billing and Patient Assistance Program Information  We have supplied [employer] with any of your insurance information on file for billing purposes. [employer] offers a sliding scale Patient Assistance Program for patients that do not have  insurance, or whose insurance does not completely cover the cost of the ZIO monitor.  You must apply for the Patient Assistance Program to qualify for this discounted rate.  To apply, please call [employer] at [phone], select option 4, select option 2, ask to apply for  Patient Assistance Program. Theodore Demark will ask your household income, and how many people  are in your household. They will quote your out-of-pocket cost based on that information.  [employer] will also  be able to set up a 55-month, interest-free payment plan if needed.  Applying the monitor   Shave hair from upper left chest.  Hold abrader disc by orange tab. Rub abrader in 40 strokes over the upper left chest as  indicated in your monitor instructions.  Clean area with 4 enclosed alcohol pads. Let dry.  Apply patch as indicated in monitor instructions. Patch will be placed under  collarbone on left  side of chest with arrow pointing upward.  Rub patch adhesive wings for 2 minutes. Remove white label marked "1". Remove the white  label marked "2". Rub patch adhesive wings for 2 additional minutes.  While looking in a mirror, press and release button in center of patch. A small green light will  flash 3-4 times. This will be your only indicator that the monitor has been turned on.  Do not shower for the first 24 hours. You may shower after the first 24 hours.  Press the button if you feel a symptom. You will hear a small click. Record Date, Time and  Symptom in the Patient Logbook.  When you are ready to remove the patch, follow instructions on the last 2 pages of Patient  Logbook. Stick patch monitor onto the last page of Patient Logbook.  Place Patient Logbook in the blue and white box. Use locking tab on box and tape box closed  securely. The blue and white box has prepaid postage on it. Please place it in the mailbox as  soon as possible. Your physician should have your test results approximately 7 days after the  monitor has been mailed back to [facility].  Call [facility] Customer Care at [phone] if you have questions regarding  your ZIO XT patch monitor. Call them immediately if you see an orange light blinking on your  monitor.  If your monitor falls off in less than 4 days, contact our Monitor department at [phone].  If your monitor becomes loose or falls off after 4 days call [employer] at [phone] for  suggestions on securing your monitor

## 2022-08-12 NOTE — [facility] Notes (Unsigned)
Enrolled for [employer] to mail a ZIO XT long term holter monitor to the patients address on file.  

## 2022-08-12 NOTE — Progress Notes (Signed)
Primary Care Provider: Camillia Herter, NP [place] Cardiologist: Glenetta Hew, MD Electrophysiologist: None  Clinic Note: No chief complaint on file.   ===================================  ASSESSMENT/PLAN   Problem List Items Addressed This Visit       Cardiology Problems   Non-ischemic cardiomyopathy [facility]) - Primary   Frequent PVCs    ===================================  HPI:    Todd Greer is a 66 y.o. male who is being seen today for the evaluation of *** at the request of Camillia Herter, NP.  Todd Greer was last seen on ***  He was last seen by Dr. Terrence Dupont in June.  Recent Hospitalizations: ***  Reviewed  CV studies:    The following studies were reviewed today: (if available, images/films reviewed: From Epic Chart or Care Everywhere) Cardiac catheterization September 09, 2017: Severe LV function with EF 25% but normal LVEDP.  Normal coronaries. Echocardiogram 09/06/2017: EF 15 to 20%.  Diffuse HK.  Global HK.  Severe dilated LA.  Mild RV dilation.  Mild MR.   Interval History:   Valda Favia   CV Review of Symptoms (Summary): {roscv:310661}  REVIEWED OF SYSTEMS   ROS  I have reviewed and (if needed) personally updated the patient's problem list, medications, allergies, past medical and surgical history, social and family history.   PAST MEDICAL HISTORY   Past Medical History:  Diagnosis Date   Anxiety    Diabetes mellitus type II, controlled, with no complications (HCC)    Hepatitis C, chronic (HCC)    Hypertension    Obesity, Class II, BMI 35-39.9, with comorbidity    Obesity, diabetes, and hypertension syndrome (HCC)    Substance abuse ([place])     PAST SURGICAL HISTORY   Past Surgical History:  Procedure Laterality Date   LEFT HEART CATH AND CORONARY ANGIOGRAPHY N/A 09/09/2017   Procedure: LEFT HEART CATH AND CORONARY ANGIOGRAPHY;  Surgeon: Charolette Forward, MD;  Location: [place] [facility];  Service: Cardiovascular;   Laterality: N/A;   NM  EXERCISE MYOVIEW LTD  06-2013   No ischemia or infarction; note dated images    Immunization History  Administered Date(s) Administered   Fluad Quad(high Dose 65+) 07/29/2022   Hepatitis B 09/02/2005   Influenza,inj,Quad PF,6+ Mos 05/28/2013, 06/03/2014, 06/29/2015, 06/08/2021   Influenza-Unspecified 08/18/2020   PFIZER(Purple Top)SARS-COV-2 Vaccination 09/23/2019, 10/14/2019, 08/18/2020   PNEUMOCOCCAL CONJUGATE-20 06/08/2021   Pneumococcal Polysaccharide-23 05/28/2013   Tdap 02/25/2013    MEDICATIONS/ALLERGIES   Current Meds  Medication Sig   aspirin 81 MG tablet Take 81 mg by mouth daily.   atorvastatin (LIPITOR) 20 MG tablet Take 1 tablet (20 mg total) by mouth daily.   Blood Glucose Monitoring Suppl (ONE TOUCH ULTRA 2) w/Device KIT 1 Device by Does not apply route 4 (four) times daily -  before meals and at bedtime.   carvedilol (COREG) 12.5 MG tablet Take 1 tablet (12.5 mg total) by mouth 2 (two) times daily.   empagliflozin (JARDIANCE) 10 MG TABS tablet Take 1 tablet (10 mg total) by mouth daily before breakfast.   ENTRESTO 49-51 MG Take 1 tablet by mouth 2 (two) times daily.   glipiZIDE (GLIPIZIDE XL) 10 MG 24 hr tablet Take 2 tablets (20 mg total) by mouth daily.   glucose blood (ONETOUCH ULTRA) test strip Check blood sugars 4 times a day before and after meals   metFORMIN (GLUCOPHAGE) 1000 MG tablet Take 1 tablet (1,000 mg total) by mouth 2 (two) times daily with a meal.   Multiple Vitamin (  ONE-A-DAY MENS PO) Take 1 tablet by mouth daily.   OneTouch Delica Lancets 35K MISC Check blood sugars 4 times a day before and after meals   spironolactone (ALDACTONE) 25 MG tablet Take 1 tablet (25 mg total) by mouth daily.    Allergies  Allergen Reactions   Ace Inhibitors Cough    Tolerates ARB   Amlodipine Other (See Comments)    Fatigue    SOCIAL HISTORY/FAMILY HISTORY   Reviewed in Epic:   Social History   Tobacco Use   Smoking status: Former     Packs/day: 0.50    Years: 10.00    Total pack years: 5.00    Types: Cigarettes    Quit date: 10/04/1983    Years since quitting: 38.8    Passive exposure: Past   Smokeless tobacco: Never  Vaping Use   Vaping Use: Never used  Substance Use Topics   Alcohol use: Yes    Alcohol/week: 6.0 standard drinks of alcohol    Types: 6 Standard drinks or equivalent per week   Drug use: No   Social History   Social History Narrative   His married father of 4.  His married for 28 years.  He lives with his wife and daughters.  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  Education: [employer].   He quit smoking in 1985.  He drinks 3 beers a week.   Prior to the onset of his current symptoms, used to work out with weights for at least 40 minutes a day for 3 times a week.  He usually did light weights for many occasions as opposed to heavy weights.      Contacts: Wife Nicki Reaper; daughter Sandi Mealy   Family History  Problem Relation Age of Onset   Stroke Father    Diabetes Father    Hypertension Father    Cancer Sister    Cerebral aneurysm Mother        Died at a young age.   Diabetes Paternal Grandmother    Hypertension Paternal Grandmother    Stroke Paternal Grandmother     OBJCTIVE -PE, EKG, labs   Wt Readings from Last 3 Encounters:  08/12/22 214 lb 9.6 oz (97.3 kg)  07/29/22 208 lb (94.3 kg)  04/29/22 205 lb (93 kg)    Physical Exam: BP 124/80 (BP Location: Left Arm, Patient Position: Sitting, Cuff Size: Large)   Pulse 81   Ht _0  (1.702 m)   Wt 214 lb 9.6 oz (97.3 kg)   SpO2 99%   BMI 33.61 kg/m  Physical Exam   Adult ECG Report  Rate: *** ;  Rhythm: {rhythm:17366};   Narrative Interpretation: ***  Recent Labs:  ***  Lab Results  Component Value Date   CHOL 141 10/25/2020   HDL 53 10/25/2020   LDLCALC 73 10/25/2020   TRIG 76 10/25/2020   CHOLHDL 2.7 10/25/2020   Lab Results  Component Value Date   CREATININE  1.05 04/29/2022   BUN 16 04/29/2022   NA 137 04/29/2022   K 4.5 04/29/2022   CL 103 04/29/2022   CO2 20 04/29/2022      Latest Ref Rng & Units 07/06/2021    8:02 PM 10/25/2020    9:15 AM 12/20/2019    3:36 PM  CBC  WBC 4.0 - 10.5 K/uL 5.4  5.3  7.6   Hemoglobin 13.0 - 17.0 g/dL 14.5  15.3  14.9   Hematocrit 39.0 - 52.0 %  43.4  46.7  43.9   Platelets 150 - 400 K/uL 248  237  209     Lab Results  Component Value Date   HGBA1C 6.6 (A) 07/29/2022   Lab Results  Component Value Date   TSH 3.360 10/25/2020    ================================================== I spent a total of *** minutes with the patient spent in direct patient consultation.  Additional time spent with chart review  / charting (studies, outside notes, etc): *** min Total Time: *** min  Current medicines are reviewed at length with the patient today.  (+/- concerns) ***  Notice: This dictation was prepared with Dragon dictation along with smart phrase technology. Any transcriptional errors that result from this process are unintentional and may not be corrected upon review.   Studies Ordered:  No orders of the defined types were placed in this encounter.  No orders of the defined types were placed in this encounter.   Patient Instructions / Medication Changes & Studies & Tests Ordered   There are no Patient Instructions on file for this visit.    Leonie Man, MD, MS Glenetta Hew, M.D., M.S. Interventional Cardiologist  [facility]  Pager # [phone] Phone # [phone] [address] [place], [place] [address]   Thank you for choosing [facility] [place]!!

## 2022-08-13 ENCOUNTER — Encounter: Payer: Self-pay | Admitting: Cardiology

## 2022-08-13 DIAGNOSIS — R001 Bradycardia, unspecified: Secondary | ICD-10-CM | POA: Insufficient documentation

## 2022-08-13 NOTE — Assessment & Plan Note (Addendum)
Diagnosed in 2019, unfortunate do not have any other information since that hospital visit.  He has been seen by Dr. Sharyn Lull, but is somewhat upset about lack of communication.  He feels like he is not getting any answers when asked questions.  He would like to transfer care.  I explained to him that he is on outstanding medications with carvedilol, Entresto, spironolactone and Jardiance. Cardiac catheterization showed widely patent coronaries and I reviewed the images.  EDP was actually pretty normal as well.  That goes along with the fact that he is not really requiring diuretic.  His blood pressure is well-controlled, he is not even on a diuretic indicating no real significant edema or PND. He has exertional dyspnea but has an EF that is low.  He has not had any recent evaluation.  Plan: Will recheck 2D echo just to get new baseline.

## 2022-08-13 NOTE — Assessment & Plan Note (Addendum)
Actually not unexpected given his significant reduced EF.  Has had relatively secure ischemic evaluation in the past.  I suspect this simply just related to his CHF.  Reassess with 2D echocardiogram. Continue current meds for now.

## 2022-08-13 NOTE — Assessment & Plan Note (Signed)
NYHA Class II symptoms at present.  Has not had a documented evaluation since 2019 although he may have had some with Dr. Sharyn Lull.  He cannot recall the last time he had an evaluation.  Plan: Check 2D echocardiogram. Otherwise would continue his current regimen of carvedilol 12.5 mg twice daily, Entresto 49 to 51 mg twice daily, spironolactone 25 mg daily and Jardiance 10 mg.  We may need to give him a prescription for as needed Lasix, but for now can hold off.

## 2022-08-13 NOTE — Assessment & Plan Note (Signed)
He is concerned about his Apple watch showing low heart rates in the 40s and maybe even as low as the 30s at night.  He is on carvedilol which is necessary for his cardiomyopathy, and he also has significant PVCs.  Plan: 7-day Zio patch monitor to assess PVC burden and heart rate.

## 2022-08-13 NOTE — Assessment & Plan Note (Signed)
Blood pressure seems pretty well-controlled on Entresto, carvedilol and spironolactone.  Doing well.  No changes.  Tolerating all 3 meds.

## 2022-08-13 NOTE — Assessment & Plan Note (Signed)
Frequent PVCs in the setting of known cardiomyopathy and some bradycardia.  Plan: Check 2D echo and Zio patch monitor to assess PVC burden.

## 2022-08-15 DIAGNOSIS — I493 Ventricular premature depolarization: Secondary | ICD-10-CM | POA: Diagnosis not present

## 2022-08-15 DIAGNOSIS — I428 Other cardiomyopathies: Secondary | ICD-10-CM

## 2022-08-19 ENCOUNTER — Telehealth: Payer: Self-pay | Admitting: Family

## 2022-08-19 NOTE — Telephone Encounter (Signed)
Left message for patient to call back and schedule Medicare Annual Wellness Visit (AWV) either virtually or phone . Left  my [place]ch number [phone]   *due 05/03/22 awvi per [facility]  please schedule with Nurse Health Adviser   45 min for awv-i and in office appointments 30 min for awv-s  phone/virtual appointments

## 2022-08-19 NOTE — Telephone Encounter (Signed)
Patient returned my call.  Patient declined stating it is not necessary  Do not call

## 2022-09-03 ENCOUNTER — Other Ambulatory Visit: Payer: Self-pay

## 2022-09-04 ENCOUNTER — Other Ambulatory Visit: Payer: Self-pay

## 2022-09-05 DIAGNOSIS — I428 Other cardiomyopathies: Secondary | ICD-10-CM | POA: Diagnosis not present

## 2022-09-05 DIAGNOSIS — I493 Ventricular premature depolarization: Secondary | ICD-10-CM | POA: Diagnosis not present

## 2022-09-10 ENCOUNTER — Ambulatory Visit (HOSPITAL_COMMUNITY): Payer: Medicare Other | Attending: Cardiology

## 2022-09-10 DIAGNOSIS — I428 Other cardiomyopathies: Secondary | ICD-10-CM | POA: Insufficient documentation

## 2022-09-10 DIAGNOSIS — I493 Ventricular premature depolarization: Secondary | ICD-10-CM | POA: Insufficient documentation

## 2022-09-10 LAB — ECHOCARDIOGRAM COMPLETE
Area-P 1/2: 2.48 cm2
S' Lateral: 3.4 cm

## 2022-09-29 ENCOUNTER — Other Ambulatory Visit: Payer: Self-pay | Admitting: Family

## 2022-09-29 DIAGNOSIS — I10 Essential (primary) hypertension: Secondary | ICD-10-CM

## 2022-09-29 DIAGNOSIS — I5022 Chronic systolic (congestive) heart failure: Secondary | ICD-10-CM

## 2022-09-30 ENCOUNTER — Other Ambulatory Visit: Payer: Self-pay

## 2022-09-30 MED ORDER — CARVEDILOL 12.5 MG PO TABS
12.5000 mg | ORAL_TABLET | Freq: Two times a day (BID) | ORAL | 0 refills | Status: DC
  Filled 2022-09-30 – 2022-10-20 (×4): qty 180, 90d supply, fill #0

## 2022-09-30 MED ORDER — SPIRONOLACTONE 25 MG PO TABS
25.0000 mg | ORAL_TABLET | Freq: Every day | ORAL | 0 refills | Status: DC
  Filled 2022-09-30 – 2022-10-20 (×4): qty 90, 90d supply, fill #0

## 2022-09-30 NOTE — Telephone Encounter (Signed)
Requested Prescriptions  Pending Prescriptions Disp Refills   carvedilol (COREG) 12.5 MG tablet 180 tablet 0    Sig: Take 1 tablet (12.5 mg total) by mouth 2 (two) times daily.     Cardiovascular: Beta Blockers 3 Failed - 09/29/2022  2:03 PM      Failed - AST in normal range and within 360 days    AST  Date Value Ref Range Status  04/29/2022 43 (H) 0 - 40 IU/L Final         Passed - Cr in normal range and within 360 days    Creat  Date Value Ref Range Status  06/03/2014 0.86 0.50 - 1.35 mg/dL Final   Creatinine, Ser  Date Value Ref Range Status  04/29/2022 1.05 0.76 - 1.27 mg/dL Final         Passed - ALT in normal range and within 360 days    ALT  Date Value Ref Range Status  04/29/2022 41 0 - 44 IU/L Final         Passed - Last BP in normal range    BP Readings from Last 1 Encounters:  08/12/22 124/80         Passed - Last Heart Rate in normal range    Pulse Readings from Last 1 Encounters:  08/12/22 81         Passed - Valid encounter within last 6 months    Recent Outpatient Visits           2 months ago Type 2 diabetes mellitus with diabetic polyneuropathy, without long-term current use of insulin ([place])   [place] [facility], Amy J, NP   5 months ago Type 2 diabetes mellitus with diabetic polyneuropathy, without long-term current use of insulin ([place])   [place] [facility] at [facility], Amy J, NP   11 months ago Type 2 diabetes mellitus with diabetic polyneuropathy, without long-term current use of insulin ([place])    [facility] at [facility], Amy J, NP   1 year ago Nonintractable headache, unspecified chronicity pattern, unspecified headache type   [facility] at [facility], Amy J, NP   1 year ago Nonintractable headache, unspecified chronicity pattern, unspecified headache type   [facility], Kriste Basque, NP       Future  Appointments             In 2 weeks Leonie Man, MD [place] at [facility]   In 3 weeks Camillia Herter, NP [facility]             spironolactone (ALDACTONE) 25 MG tablet 90 tablet 0    Sig: Take 1 tablet (25 mg total) by mouth daily.     Cardiovascular: Diuretics - Aldosterone Antagonist Passed - 09/29/2022  2:03 PM      Passed - Cr in normal range and within 180 days    Creat  Date Value Ref Range Status  06/03/2014 0.86 0.50 - 1.35 mg/dL Final   Creatinine, Ser  Date Value Ref Range Status  04/29/2022 1.05 0.76 - 1.27 mg/dL Final         Passed - K in normal range and within 180 days    Potassium  Date Value Ref Range Status  04/29/2022 4.5 3.5 - 5.2 mmol/L Final         Passed - Na  in normal range and within 180 days    Sodium  Date Value Ref Range Status  04/29/2022 137 134 - 144 mmol/L Final         Passed - eGFR is 30 or above and within 180 days    GFR, Est African American  Date Value Ref Range Status  06/03/2014 >89 mL/min Final   GFR calc Af Amer  Date Value Ref Range Status  10/25/2020 77 >59 mL/min/1.73 Final    Comment:    **In accordance with recommendations from the NKF-ASN Task force,**   [facility] is in the process of updating its eGFR calculation to the   2021 CKD-EPI creatinine equation that estimates kidney function   without a race variable.    GFR, Est Non African American  Date Value Ref Range Status  06/03/2014 >89 mL/min Final    Comment:      The estimated GFR is a calculation valid for adults (>=12 years old) that uses the CKD-EPI algorithm to adjust for age and sex. It is   not to be used for children, pregnant women, hospitalized patients,    patients on dialysis, or with rapidly changing kidney function. According to the NKDEP, eGFR >89 is normal, 60-89 shows mild impairment, 30-59 shows moderate impairment, 15-29 shows severe impairment and <15 is ESRD.     GFR,  Estimated  Date Value Ref Range Status  07/06/2021 >60 >60 mL/min Final    Comment:    (NOTE) Calculated using the CKD-EPI Creatinine Equation (2021)    eGFR  Date Value Ref Range Status  04/29/2022 79 >59 mL/min/1.73 Final         Passed - Last BP in normal range    BP Readings from Last 1 Encounters:  08/12/22 124/80         Passed - Valid encounter within last 6 months    Recent Outpatient Visits           2 months ago Type 2 diabetes mellitus with diabetic polyneuropathy, without long-term current use of insulin ([place])   [place] [facility], Amy J, NP   5 months ago Type 2 diabetes mellitus with diabetic polyneuropathy, without long-term current use of insulin ([place])   [place] [facility] at [facility], Amy J, NP   11 months ago Type 2 diabetes mellitus with diabetic polyneuropathy, without long-term current use of insulin ([place])   [place] [facility] at [facility], Amy J, NP   1 year ago Nonintractable headache, unspecified chronicity pattern, unspecified headache type   [facility] at [facility], Amy J, NP   1 year ago Nonintractable headache, unspecified chronicity pattern, unspecified headache type   [facility] at [facility], Kriste Basque, NP       Future Appointments             In 2 weeks Leonie Man, MD [place] at [facility]   In 3 weeks Camillia Herter, NP [place] at [facility]

## 2022-10-01 ENCOUNTER — Other Ambulatory Visit: Payer: Self-pay

## 2022-10-02 ENCOUNTER — Other Ambulatory Visit: Payer: Self-pay

## 2022-10-10 ENCOUNTER — Other Ambulatory Visit: Payer: Self-pay

## 2022-10-15 ENCOUNTER — Ambulatory Visit: Payer: Medicare Other | Attending: Cardiology | Admitting: Cardiology

## 2022-10-15 ENCOUNTER — Encounter: Payer: Self-pay | Admitting: Cardiology

## 2022-10-15 VITALS — BP 110/74 | HR 81 | Ht 67.0 in | Wt 211.4 lb

## 2022-10-15 DIAGNOSIS — I10 Essential (primary) hypertension: Secondary | ICD-10-CM | POA: Diagnosis not present

## 2022-10-15 DIAGNOSIS — I428 Other cardiomyopathies: Secondary | ICD-10-CM

## 2022-10-15 DIAGNOSIS — R0609 Other forms of dyspnea: Secondary | ICD-10-CM

## 2022-10-15 DIAGNOSIS — I493 Ventricular premature depolarization: Secondary | ICD-10-CM

## 2022-10-15 DIAGNOSIS — R008 Other abnormalities of heart beat: Secondary | ICD-10-CM | POA: Diagnosis not present

## 2022-10-15 DIAGNOSIS — I5032 Chronic diastolic (congestive) heart failure: Secondary | ICD-10-CM

## 2022-10-15 NOTE — Progress Notes (Signed)
Primary Care Provider: Camillia Herter, NP  Previous Cardiologist: Dr. 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  Not able to push beta-blocker further-refer to EP      Relevant Orders   Ambulatory referral to Cardiac Electrophysiology   Non-ischemic cardiomyopathy ([place]) - Resolved - Primary (Chronic)   Relevant Orders   Ambulatory referral to Cardiac Electrophysiology   Frequent PVCs (Chronic)    Concerned that he has had some bradycardia episodes on his monitor still having a lot of PVCs (27%).  Reluctant to push beta-blocker further.  Question if the PVCs could be a potential etiology for reduced EF in the future.  Plan: Refer to EP to consider PVC ablation      Relevant Orders   Ambulatory referral to Cardiac Electrophysiology   Essential hypertension (Chronic)    BP well-controlled on current dose of carvedilol and Entresto along with spironolactone.  No longer having the dizziness.      (HFpEF) heart failure with preserved ejection fraction (HCC) (Chronic)    Pretty much resolved HFrEF, with his most recent echo now back up to normal EF. EF is now 60 to 65% with no RWMA.  This would argue that potentially his cardiomyopathy was was related to reversible etiology.  He is on excellent regimen which we will continue since he did have such a reduced EF.  Plan: Continue current dose of Entresto, carvedilol and Jardiance along with spironolactone. Understandably, not requiring diuretic.        Other   Exertional dyspnea (Chronic)    Probably related to deconditioning.  Recommend  increase exercise.     ===================================  HPI:    Todd Greer is a 67 y.o. male With Longstanding History of Nonischemic Cardiomyopathy (dating back to 2019-see Echo and Cath from 2019) who is being seen today to Transfer Cardiology Care at the request of Camillia Herter, NP.  January 2019, NICM:  Echo: EF 15-20% with severe diffuse HK.  No RWMA.  Normal valves. Cath: Normal coronaries, Right Dominant with Ramus Intermedius; EF < 25% with elevated LVEDP Uncontrolled HTN DM-2 Long-standing EtOH abuse, and PSA (cocaine and heroin)  Todd Greer was last seen by Dr. Terrence Dupont back in June 2023 w/ c/o palpitations-> unfortunately, I do not have the note. He was then seen by [name], NP on July 29, 2022 -> noting concerns for fluctuating awareness heart rate and was concerned about his beta-blocker dosing.  He said his heart rate showed heart rates in the 30s at nighttime.  He tried to contact Dr. Terrence Dupont but did not hear back.  He was concerned that he was not getting answers.  He did requested transitioning cardiology care to [facility].  He is seen for initial evaluation 08/12/22: Noted no additional cardiac evaluation available since 2019.  Lives on excellent occasions including carvedilol, Entresto, spironolactone and Jardiance--not on Lasix for over 3 years..  BP is pretty well-controlled.  Did note some exertional dyspnea but not significant.  No PND orthopnea.  Most notably she was frequent PVCs on EKG, and Apple Watch showing bradycardia in the 40s.  Noting some  skipped beats and occasional fluttering sensations.  Able to walk 01-6999 steps a day, working part-time at [employer]  Notes a little bit of exertional dyspnea but not with routine activity.  No PND orthopnea.  No edema.  Some dizziness with bending over. NYHA class II symptoms-plan was to recheck 2D echo and event monitor based on amount of PVCs noted.  No medication changes made.  Recent  Hospitalizations: No recent hospitalizations  Reviewed  CV studies:    The following studies were reviewed today: (if available, images/films reviewed: From Epic Chart or Care Everywhere) 2D Echo 09/10/2022:  Zio patch 09/07/2022   Interval History:   Todd Greer returns here today overall still doing pretty well.  He does feel some skipped beats here and there but has not necessarily noticed any real symptoms of syncope or near syncope.  The most notable thing he feels a persistent cough.  He says his heart rates have been better overall. Maybe a little orthopnea but it is probably more related to weight.  No PND or edema.  Rare twinging sensations in his chest but no prolonged chest pain or pressure.  CV Review of Symptoms (Summary): positive for - dyspnea on exertion, irregular heartbeat, palpitations, shortness of breath, and some lightheadedness and dizziness but no near syncope. negative for - chest pain, loss of consciousness, orthopnea, paroxysmal nocturnal dyspnea, rapid heart rate, shortness of breath, or TIA/amaurosis fugax or claudication.  REVIEWED OF SYSTEMS   Review of Systems  Constitutional:  Negative for malaise/fatigue and weight loss.  HENT:  Negative for congestion.   Respiratory:  Positive for cough (Persistent). Negative for sputum production and shortness of breath (Per HPI).   Cardiovascular:        Per HPI  Gastrointestinal:  Negative for blood in stool and melena.  Genitourinary:  Negative for frequency and hematuria.  Musculoskeletal:  Positive for joint pain.  Neurological:  Positive for dizziness. Negative for focal weakness.  Psychiatric/Behavioral:  Negative for depression and memory loss. The patient is not nervous/anxious and does not have insomnia.    I have reviewed and (if needed) personally updated the patient's problem list, medications, allergies, past medical and surgical history, social and family history.   PAST MEDICAL HISTORY   Past  Medical History:  Diagnosis Date   Anxiety    Chronic combined systolic and diastolic congestive heart failure, NYHA class 2 ([place]) 09/2017   Has been maintained on excellent medications: Carvedilol 12.5 mg BID, Entresto 49 -51 mg p.o. BID, empagliflozin 10 mg daily, spironolactone 25 mg daily.   Diabetes mellitus type II, controlled, with no complications (HCC)    Hepatitis C, chronic ([place])    Hypertension    Nonischemic congestive cardiomyopathy ([place]) 09/2017   Admitted for CHF: Echo EF 15 to 20% diffuse HK, severely dilated LA.  Mild RV dilation.  Mild MR.  Catheterization showed EF 25% normal EDP and normal coronaries (has been followed by Dr. Terrence Dupont)   Obesity, Class II, BMI 35-39.9, with comorbidity    Obesity, diabetes, and hypertension syndrome ([place])    Substance abuse ([place])     PAST SURGICAL HISTORY   Past Surgical History:  Procedure Laterality Date   LEFT HEART CATH AND CORONARY ANGIOGRAPHY N/A 09/09/2017   Procedure: LEFT HEART CATH AND CORONARY ANGIOGRAPHY;  Surgeon: Charolette Forward, MD;  Location: [facility];  Service: Cardiovascular; EF 15 to 20%.  Diffuse HK.  Normal LVEDP.  Normal coronaries.   [place]  06/02/2013   Good exercise capacity.  Hypertensive response to exercise.  Dyspneic on exercise but no significant ST changes to suggest ischemia.  Normal stress images.  No ischemia or infarction   TRANSTHORACIC ECHOCARDIOGRAM  09/06/2017   EF 15 to 20%.  Diffuse HK.  Severe LA dilation.  Mild RV dilation.  Mild MR.    Myoview 06/03/2013: Good exercise capacity.  Hypertensive response to exercise.  Dyspneic on exercise but no significant ST changes to suggest ischemia.  Normal stress images.  No ischemia or infarction. Cardiac Catheterization September 09, 2017: Severe LV function with EF 25% but normal LVEDP.  Normal coronaries. Echocardiogram 09/06/2017: EF 15 to 20%.  Diffuse HK.  Global HK.  Severe dilated LA.  Mild RV dilation.  Mild  MR.  MEDICATIONS/ALLERGIES   Current Meds  Medication Sig   aspirin 81 MG tablet Take 81 mg by mouth daily.   atorvastatin (LIPITOR) 20 MG tablet Take 1 tablet (20 mg total) by mouth daily.   Blood Glucose Monitoring Suppl (ONE TOUCH ULTRA 2) w/Device KIT 1 Device by Does not apply route 4 (four) times daily -  before meals and at bedtime.   carvedilol (COREG) 12.5 MG tablet Take 1 tablet (12.5 mg total) by mouth 2 (two) times daily.   empagliflozin (JARDIANCE) 10 MG TABS tablet Take 1 tablet (10 mg total) by mouth daily before breakfast.   ENTRESTO 49-51 MG Take 1 tablet by mouth 2 (two) times daily.   glipiZIDE (GLIPIZIDE XL) 10 MG 24 hr tablet Take 2 tablets (20 mg total) by mouth daily.   glucose blood (ONETOUCH ULTRA) test strip Check blood sugars 4 times a day before and after meals   metFORMIN (GLUCOPHAGE) 1000 MG tablet Take 1 tablet (1,000 mg total) by mouth 2 (two) times daily with a meal.   Multiple Vitamin (ONE-A-DAY MENS PO) Take 1 tablet by mouth daily.   OneTouch Delica Lancets 99991111 MISC Check blood sugars 4 times a day before and after meals   spironolactone (ALDACTONE) 25 MG tablet Take 1 tablet (25 mg total) by mouth daily.     Allergies  Allergen Reactions   Ace Inhibitors Cough    Tolerates ARB   Amlodipine Other (See Comments)    Fatigue    SOCIAL HISTORY/FAMILY HISTORY   Reviewed in Epic:   Social History   Tobacco Use   Smoking status: Former    Packs/day: 0.50    Years: 10.00    Total pack years: 5.00    Types: Cigarettes    Quit date: 10/04/1983    Years since quitting: 39.0    Passive exposure: Past   Smokeless tobacco: Never  Vaping Use   Vaping Use: Never used  Substance Use Topics   Alcohol use: Yes    Alcohol/week: 6.0 standard drinks of alcohol    Types: 6 Standard drinks or equivalent per week   Drug use: No   Social History   Social History Narrative   His married father of 4.  His married for 28 years.  He lives with his wife and  daughters.  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  Education: [employer].   He quit smoking in 1985.  He drinks 3 beers a week.   Prior to the onset of his current symptoms, used to work out with weights for at least 40 minutes a day for 3 times a week.  He usually did light weights for many occasions  as opposed to heavy weights.      Contacts: Wife Nicki Reaper; daughter Sandi Mealy   Family History  Problem Relation Age of Onset   Stroke Father    Diabetes Father    Hypertension Father    Cancer Sister    Cerebral aneurysm Mother        Died at a young age.   Diabetes Paternal Grandmother    Hypertension Paternal Grandmother    Stroke Paternal Grandmother     OBJCTIVE -PE, EKG, labs   Wt Readings from Last 3 Encounters:  10/15/22 211 lb 6.4 oz (95.9 kg)  08/12/22 214 lb 9.6 oz (97.3 kg)  07/29/22 208 lb (94.3 kg)    Physical Exam: BP 110/74   Pulse 81   Ht 5' 7"$  (1.702 m)   Wt 211 lb 6.4 oz (95.9 kg)   SpO2 99%   BMI 33.11 kg/m  Physical Exam Vitals reviewed.  Constitutional:      General: He is not in acute distress.    Appearance: Normal appearance. He is obese. He is not ill-appearing or toxic-appearing.  HENT:     Head: Normocephalic and atraumatic.  Neck:     Vascular: No carotid bruit.  Cardiovascular:     Rate and Rhythm: Normal rate and regular rhythm. No extrasystoles are present.    Chest Wall: PMI is displaced (Lateral, difficult to palpate.  Not sustained.).     Pulses: Normal pulses.     Heart sounds: S1 normal and S2 normal. No opening snap. No murmur heard.    No friction rub. Gallop present. S4 sounds present.  Pulmonary:     Effort: Pulmonary effort is normal. No respiratory distress.     Breath sounds: Normal breath sounds. No wheezing, rhonchi or rales.  Chest:     Chest wall: No tenderness.  Abdominal:     Comments: Obese.  Difficult to assess HSM  Musculoskeletal:        General: No swelling.  Normal range of motion.     Cervical back: Normal range of motion and neck supple.  Skin:    General: Skin is warm and dry.  Neurological:     General: No focal deficit present.     Mental Status: He is alert and oriented to person, place, and time.     Gait: Gait normal.  Psychiatric:        Mood and Affect: Mood normal.        Behavior: Behavior normal.        Thought Content: Thought content normal.        Judgment: Judgment normal.     Comments: Not sure if he fully understands his cardiac condition.     Adult ECG Report Not checked  Recent Labs: No recent with a panel Lab Results  Component Value Date   CHOL 141 10/25/2020   HDL 53 10/25/2020   LDLCALC 73 10/25/2020   TRIG 76 10/25/2020   CHOLHDL 2.7 10/25/2020   Lab Results  Component Value Date   CREATININE 1.05 04/29/2022   BUN 16 04/29/2022   NA 137 04/29/2022   K 4.5 04/29/2022   CL 103 04/29/2022   CO2 20 04/29/2022      Latest Ref Rng & Units 07/06/2021    8:02 PM 10/25/2020    9:15 AM 12/20/2019    3:36 PM  CBC  WBC 4.0 - 10.5 K/uL 5.4  5.3  7.6   Hemoglobin 13.0 - 17.0 g/dL 14.5  15.3  14.9   Hematocrit 39.0 - 52.0 % 43.4  46.7  43.9   Platelets 150 - 400 K/uL 248  237  209     Lab Results  Component Value Date   HGBA1C 6.6 (A) 07/29/2022   Lab Results  Component Value Date   TSH 3.360 10/25/2020    ================================================== I spent a total of 27 minutes with the patient spent in direct patient consultation.  Additional time spent with chart review  / charting (studies, outside notes, etc): 18 min Total Time: 45 min  Current medicines are reviewed at length with the patient today.  (+/- concerns) n/a  Notice: This dictation was prepared with Dragon dictation along with smart phrase technology. Any transcriptional errors that result from this process are unintentional and may not be corrected upon review.   Studies Ordered:  Orders Placed This Encounter   Procedures   Ambulatory referral to Cardiac Electrophysiology   No orders of the defined types were placed in this encounter.   Patient Instructions / Medication Changes & Studies & Tests Ordered   Patient Instructions  Medication Instructions:   No changes  *If you need a refill on your cardiac medications before your next appointment, please call your pharmacy*  Other Instructions   You have been referred to Electrophysiologist-  for frequent PVC's  Lab Work:   Not needed   Testing/Procedures: Not needed   Follow-Up: At [facility], you and your health needs are our priority.  As part of our continuing mission to provide you with exceptional heart care, we have created designated Provider Care Teams.  These Care Teams include your primary Cardiologist (physician) and Advanced Practice Providers (APPs -  Physician Assistants and Nurse Practitioners) who all work together to provide you with the care you need, when you need it.  We recommend signing up for the patient portal called "MyChart".  Sign up information is provided on this After Visit Summary.  MyChart is used to connect with patients for Virtual Visits (Telemedicine).  Patients are able to view lab/test results, encounter notes, upcoming appointments, etc.  Non-urgent messages can be sent to your provider as well.   To learn more about what you can do with MyChart, go to [email or web address].    Your next appointment:   8 month(s) Oct 2024  The format for your next appointment:   In Person  Provider:   Glenetta Hew, MD    Other Instructions   You have been referred to Electrophysiologist- for frequent PVC's     Leonie Man, MD, MS Glenetta Hew, M.D., M.S. Interventional Cardiologist  [place]  Pager # [phone] Phone # [phone] [address] [place], [place] [address]   Thank you for choosing [facility]!!

## 2022-10-15 NOTE — Patient Instructions (Addendum)
Medication Instructions:   No changes  *If you need a refill on your cardiac medications before your next appointment, please call your pharmacy*  Other Instructions   You have been referred to Electrophysiologist-  for frequent PVC's  Lab Work:   Not needed   Testing/Procedures: Not needed   Follow-Up: At [facility], you and your health needs are our priority.  As part of our continuing mission to provide you with exceptional heart care, we have created designated Provider Care Teams.  These Care Teams include your primary Cardiologist (physician) and Advanced Practice Providers (APPs -  Physician Assistants and Nurse Practitioners) who all work together to provide you with the care you need, when you need it.  We recommend signing up for the patient portal called "MyChart".  Sign up information is provided on this After Visit Summary.  MyChart is used to connect with patients for Virtual Visits (Telemedicine).  Patients are able to view lab/test results, encounter notes, upcoming appointments, etc.  Non-urgent messages can be sent to your provider as well.   To learn more about what you can do with MyChart, go to [email or web address].    Your next appointment:   8 month(s) Oct 2024  The format for your next appointment:   In Person  Provider:   Glenetta Hew, MD    Other Instructions   You have been referred to Electrophysiologist- for frequent PVC's

## 2022-10-16 NOTE — Progress Notes (Signed)
Refer to [employer] note.

## 2022-10-17 ENCOUNTER — Other Ambulatory Visit: Payer: Self-pay

## 2022-10-18 ENCOUNTER — Other Ambulatory Visit: Payer: Self-pay

## 2022-10-20 ENCOUNTER — Other Ambulatory Visit: Payer: Self-pay | Admitting: Family

## 2022-10-20 ENCOUNTER — Encounter: Payer: Self-pay | Admitting: Cardiology

## 2022-10-20 NOTE — Assessment & Plan Note (Addendum)
Concerned that he has had some bradycardia episodes on his monitor still having a lot of PVCs (27%).  Reluctant to push beta-blocker further.  Question if the PVCs could be a potential etiology for reduced EF in the future.  Plan: Refer to EP to consider PVC ablation

## 2022-10-20 NOTE — Assessment & Plan Note (Signed)
27% PVCs noted with ventricular bigeminy noted as well.  Not able to push beta-blocker further-refer to EP

## 2022-10-20 NOTE — Assessment & Plan Note (Signed)
Probably related to deconditioning.  Recommend increase exercise.

## 2022-10-20 NOTE — Assessment & Plan Note (Addendum)
Pretty much resolved HFrEF, with his most recent echo now back up to normal EF. EF is now 60 to 65% with no RWMA.  This would argue that potentially his cardiomyopathy was was related to reversible etiology.  He is on excellent regimen which we will continue since he did have such a reduced EF.  Plan: Continue current dose of Entresto, carvedilol and Jardiance along with spironolactone. Understandably, not requiring diuretic.

## 2022-10-20 NOTE — Assessment & Plan Note (Signed)
BP well-controlled on current dose of carvedilol and Entresto along with spironolactone.  No longer having the dizziness.

## 2022-10-21 ENCOUNTER — Other Ambulatory Visit: Payer: Self-pay

## 2022-10-21 ENCOUNTER — Ambulatory Visit (INDEPENDENT_AMBULATORY_CARE_PROVIDER_SITE_OTHER): Payer: Medicare Other | Admitting: Family

## 2022-10-21 VITALS — BP 109/79 | HR 73 | Temp 98.3°F | Resp 16 | Wt 205.0 lb

## 2022-10-21 DIAGNOSIS — Z1321 Encounter for screening for nutritional disorder: Secondary | ICD-10-CM | POA: Diagnosis not present

## 2022-10-21 DIAGNOSIS — Z0001 Encounter for general adult medical examination with abnormal findings: Secondary | ICD-10-CM | POA: Diagnosis not present

## 2022-10-21 DIAGNOSIS — Z0000 Encounter for general adult medical examination without abnormal findings: Secondary | ICD-10-CM

## 2022-10-21 DIAGNOSIS — E1142 Type 2 diabetes mellitus with diabetic polyneuropathy: Secondary | ICD-10-CM

## 2022-10-21 DIAGNOSIS — Z1211 Encounter for screening for malignant neoplasm of colon: Secondary | ICD-10-CM | POA: Insufficient documentation

## 2022-10-21 LAB — POCT GLYCOSYLATED HEMOGLOBIN (HGB A1C): HbA1c, POC (controlled diabetic range): 7 % (ref 0.0–7.0)

## 2022-10-21 MED ORDER — ATORVASTATIN CALCIUM 20 MG PO TABS
20.0000 mg | ORAL_TABLET | Freq: Every day | ORAL | 0 refills | Status: DC
  Filled 2022-10-21 – 2022-12-21 (×3): qty 90, 90d supply, fill #0

## 2022-10-21 MED ORDER — GLIPIZIDE ER 10 MG PO TB24
20.0000 mg | ORAL_TABLET | Freq: Every day | ORAL | 2 refills | Status: DC
  Filled 2022-10-21 – 2022-10-29 (×4): qty 60, 30d supply, fill #0
  Filled 2022-11-26 (×2): qty 60, 30d supply, fill #1
  Filled 2022-12-25: qty 60, 30d supply, fill #2

## 2022-10-21 MED ORDER — EMPAGLIFLOZIN 10 MG PO TABS
10.0000 mg | ORAL_TABLET | Freq: Every day | ORAL | 2 refills | Status: DC
  Filled 2022-10-21 – 2022-10-29 (×2): qty 30, 30d supply, fill #0
  Filled 2022-11-27: qty 30, 30d supply, fill #1

## 2022-10-21 MED ORDER — METFORMIN HCL 1000 MG PO TABS
1000.0000 mg | ORAL_TABLET | Freq: Two times a day (BID) | ORAL | 2 refills | Status: DC
  Filled 2022-10-21 – 2022-10-29 (×2): qty 60, 30d supply, fill #0
  Filled 2022-11-26 (×2): qty 60, 30d supply, fill #1
  Filled 2022-12-25: qty 60, 30d supply, fill #2

## 2022-10-21 NOTE — Progress Notes (Signed)
Subjective:   Todd Greer is a 67 y.o. male who presents for an Initial Medicare Annual Wellness Visit and chronic care management.    Review of Systems    Defer to PCP Cardiac Risk Factors include: advanced age (>75mn, >>16women);diabetes mellitus;male gender;hypertension     Objective:    Today's Vitals   10/21/22 0929  BP: 109/79  Pulse: 73  Resp: 16  Temp: 98.3 F (36.8 C)  SpO2: 94%  Weight: 205 lb (93 kg)  PainSc: 0-No pain   Body mass index is 32.11 kg/m.     10/21/2022    9:31 AM 09/05/2017    2:40 PM 08/27/2017    9:01 AM 11/24/2014    7:01 PM  Advanced Directives  Does Patient Have a Medical Advance Directive? Yes No No No  Does patient want to make changes to medical advance directive? Yes (Inpatient - patient defers changing a medical advance directive at this time - Information given)     Would patient like information on creating a medical advance directive?  No - Patient declined No - Patient declined Yes - Educational materials given    Current Medications (verified) Outpatient Encounter Medications as of 10/21/2022  Medication Sig   aspirin 81 MG tablet Take 81 mg by mouth daily.   atorvastatin (LIPITOR) 20 MG tablet Take 1 tablet (20 mg total) by mouth daily.   Blood Glucose Monitoring Suppl (ONE TOUCH ULTRA 2) w/Device KIT 1 Device by Does not apply route 4 (four) times daily -  before meals and at bedtime.   carvedilol (COREG) 12.5 MG tablet Take 1 tablet (12.5 mg total) by mouth 2 (two) times daily.   empagliflozin (JARDIANCE) 10 MG TABS tablet Take 1 tablet (10 mg total) by mouth daily before breakfast.   ENTRESTO 49-51 MG Take 1 tablet by mouth 2 (two) times daily.   glipiZIDE (GLIPIZIDE XL) 10 MG 24 hr tablet Take 2 tablets (20 mg total) by mouth daily.   glucose blood (ONETOUCH ULTRA) test strip Check blood sugars 4 times a day before and after meals   metFORMIN (GLUCOPHAGE) 1000 MG tablet Take 1 tablet (1,000 mg total) by mouth 2 (two) times daily  with a meal.   Multiple Vitamin (ONE-A-DAY MENS PO) Take 1 tablet by mouth daily.   OneTouch Delica Lancets [id number] Check blood sugars 4 times a day before and after meals   spironolactone (ALDACTONE) 25 MG tablet Take 1 tablet (25 mg total) by mouth daily.   No facility-administered encounter medications on file as of 10/21/2022.    Allergies (verified) Ace inhibitors and Amlodipine   History: Past Medical History:  Diagnosis Date   Anxiety    Chronic combined systolic and diastolic congestive heart failure, NYHA class 2 ([place] 09/[place]   Has been maintained on excellent medications: Carvedilol 12.5 mg BID, Entresto 49 -51 mg p.o. BID, empagliflozin 10 mg daily, spironolactone 25 mg daily.   Diabetes mellitus type II, controlled, with no complications (HCC)    Hepatitis C, chronic ([place]    Hypertension    Nonischemic congestive cardiomyopathy ([facility] 09/2017   Admitted for CHF: Echo EF 15 to 20% diffuse HK, severely dilated LA.  Mild RV dilation.  Mild MR.  Catheterization showed EF 25% normal EDP and normal coronaries (has been followed by Dr. 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  Diffuse HK.  Normal LVEDP.  Normal coronaries.   NM  EXERCISE MYOVIEW LTD  06/02/2013   Good exercise capacity.  Hypertensive response to exercise.  Dyspneic on exercise but no significant ST changes to suggest ischemia.  Normal stress images.  No ischemia or infarction   TRANSTHORACIC ECHOCARDIOGRAM  09/06/2017   EF 15 to 20%.  Diffuse HK.  Severe LA dilation.  Mild RV dilation.  Mild MR.   Family History  Problem Relation Age of Onset    Stroke Father    Diabetes Father    Hypertension Father    Cancer Sister    Cerebral aneurysm Mother        Died at a young age.   Diabetes Paternal Grandmother    Hypertension Paternal Grandmother    Stroke Paternal Grandmother    Social History   Socioeconomic History   Marital status: Married    Spouse name: Not on file   Number of children: Not on file   Years of education: Not on file   Highest education level: Not on file  Occupational History   Not on file  Tobacco Use   Smoking status: Former    Packs/day: 0.50    Years: 10.00    Total pack years: 5.00    Types: Cigarettes    Quit date: 10/04/1983    Years since quitting: 39.0    Passive exposure: Past   Smokeless tobacco: Never  Vaping Use   Vaping Use: Never used  Substance and Sexual Activity   Alcohol use: Yes    Alcohol/week: 6.0 standard drinks of alcohol    Types: 6 Standard drinks or equivalent per week   Drug use: No   Sexual activity: Yes  Other Topics Concern   Not on file  Social History Narrative   His married father of 4.  His married for 28 years.  He lives with his wife and daughters.  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  Education: [employer].   He quit smoking in 1985.  He drinks 3 beers a week.   Prior to the onset of his current symptoms, used to work out with weights for at least 40 minutes a day for 3 times a week.  He usually did light weights for many occasions as opposed to heavy weights.      Contacts: Wife Nicki Reaper; daughter Sandi Mealy   Social Determinants of Health   Financial Resource Strain: Not on file  Food Insecurity: Not on file  Transportation Needs: Not on file  Physical Activity: Not on file  Stress: Not on file  Social Connections: Not on file    Tobacco Counseling Counseling given: Not Answered   Clinical Intake:  Pre-visit preparation completed: Yes  Pain : 0-10 Pain Score: 0-No pain     Diabetes:  Yes CBG done?: No Did pt. bring in CBG monitor from home?: No     Diabetic-Yes          Activities of Daily Living    10/21/2022    9:31 AM  In your present state of health, do you have any difficulty performing the following activities:  Hearing? 0  Vision? 0  Difficulty concentrating or making decisions? 0  Walking or climbing stairs? 0  Dressing or bathing? 0  Doing errands, shopping? 0  Preparing Food and eating ? N  Using the Toilet? N  In the past six months, have you accidently leaked urine? N  Do you have problems with loss of bowel control? N  Managing your Medications? N  Managing your Finances? N  Housekeeping or managing your Housekeeping? N    Patient Care Team: Camillia Herter, NP as PCP - General (Nurse Practitioner) Leonie Man, MD as PCP - Cardiology (Cardiology) Carol Ada, MD as Consulting Physician (Gastroenterology) Leonie Man, MD as Consulting Physician (Cardiology) Bjorn Loser, MD as Consulting Physician (Urology)  Indicate any recent Medical Services you may have received from other than [facility] providers in the past year (date may be approximate).     Assessment:   This is a routine wellness examination for Sylas.  Hearing/Vision screen No results found.  Dietary issues and exercise activities discussed: Current Exercise Habits: Home exercise routine, Type of exercise: walking, Time (Minutes): 20, Frequency (Times/Week): 4, Weekly Exercise (Minutes/Week): 80, Intensity: Mild, Exercise limited by: None identified   Goals Addressed   None   Depression Screen    10/21/2022    9:31 AM 07/29/2022    9:22 AM 04/29/2022    9:39 AM 08/08/2021   10:22 AM 06/08/2021   10:40 AM 03/29/2021    2:15 PM 12/25/2020   11:06 AM  PHQ 2/9 Scores  PHQ - 2 Score 0 0 0 0 0 0 0  PHQ- 9 Score      0 0    Fall Risk    10/21/2022    9:31 AM 07/29/2022    9:22 AM 04/29/2022    9:39 AM 10/09/2021    9:44 AM 06/08/2021   10:40 AM  Fall Risk    Falls in the past year? 0 0 0 0 0  Number falls in past yr: 0 0 0 0   Injury with Fall? 0 0 0 0   Risk for fall due to : No Fall Risks No Fall Risks No Fall Risks No Fall Risks   Follow up Falls evaluation completed Falls evaluation completed Falls evaluation completed      [place]:  Any stairs in or around the home? No  If so, are there any without handrails?  N/A Home free of loose throw rugs in walkways, pet beds, electrical cords, etc? Yes  Adequate lighting in your home to reduce risk of falls? Yes   ASSISTIVE DEVICES UTILIZED TO PREVENT FALLS:  Life alert? Yes  Use of a cane, walker or w/c? No  Grab bars in the bathroom? No  Shower chair or bench in shower? No  Elevated toilet seat or a handicapped toilet? No   TIMED UP AND GO:  Was the test performed? .  Length of time to ambulate 10 feet:  sec.     Cognitive Function:    10/21/2022    9:32 AM  MMSE - Mini Mental State Exam  Orientation to time 5  Orientation to Place 5  Registration 3  Attention/ Calculation 5  Recall 3  Language- name 2 objects 2  Language- repeat 1  Language- follow 3 step command 3  Language- read & follow direction 1  Write a sentence 1  Copy design 1  Total score 30        10/21/2022    9:32 AM  6CIT Screen  What Year? 0  points  What month? 0 points  What time? 0 points  Count back from 20 0 points  Months in reverse 0 points  Repeat phrase 0 points  Total Score 0 points    Immunizations Immunization History  Administered Date(s) Administered   Fluad Quad(high Dose 65+) 07/29/2022   Hepatitis B 09/02/2005   Influenza,inj,Quad PF,6+ Mos 05/28/2013, 06/03/2014, 06/29/2015, 06/08/2021   Influenza-Unspecified 08/18/2020   PFIZER(Purple Top)SARS-COV-2 Vaccination 09/23/2019, 10/14/2019, 08/18/2020   PNEUMOCOCCAL CONJUGATE-20 06/08/2021   Pneumococcal Polysaccharide-23 05/28/2013   Tdap 02/25/2013    TDAP status: Up to date  Flu  Vaccine status: Up to date  Pneumococcal vaccine status: Up to date  Covid-19 vaccine status: Completed vaccines  Qualifies for Shingles Vaccine? Yes   Zostavax completed No   Shingrix Completed?: No.    Education has been provided regarding the importance of this vaccine. Patient has been advised to call insurance company to determine out of pocket expense if they have not yet received this vaccine. Advised may also receive vaccine at local pharmacy or Health Dept. Verbalized acceptance and understanding.  Screening Tests Health Maintenance  Topic Date Due   Diabetic kidney evaluation - Urine ACR  Never done   FOOT EXAM  12/19/2020   COVID-19 Vaccine (4 - 2023-24 season) 05/03/2022   OPHTHALMOLOGY EXAM  08/15/2022   Zoster Vaccines- Shingrix (1 of 2) 01/19/2023 (Originally 05/11/2006)   DTaP/Tdap/Td (2 - Td or Tdap) 02/26/2023   HEMOGLOBIN A1C  04/21/2023   Diabetic kidney evaluation - eGFR measurement  04/30/2023   COLONOSCOPY (Pts 45-30yr Insurance coverage will need to be confirmed)  07/02/2023   Medicare Annual Wellness (AWV)  10/22/2023   Pneumonia Vaccine 67 Years old  Completed   INFLUENZA VACCINE  Completed   Hepatitis C Screening  Completed   HPV VACCINES  Aged Out    Health Maintenance  Health Maintenance Due  Topic Date Due   Diabetic kidney evaluation - Urine ACR  Never done   FOOT EXAM  12/19/2020   COVID-19 Vaccine (4 - 2023-24 season) 05/03/2022   OPHTHALMOLOGY EXAM  08/15/2022    Colorectal cancer screening: Type of screening: Colonoscopy. Completed 07/01/2013. Repeat every 10 years  Lung Cancer Screening: (Low Dose CT Chest recommended if Age 67-80years, 30 pack-year currently smoking OR have quit w/in 15years.) does not qualify.   Lung Cancer Screening Referral: N/A  Additional Screening:  Hepatitis C Screening: does qualify; Completed 10/09/21  Vision Screening: Recommended annual ophthalmology exams for early detection of glaucoma and other  disorders of the eye. Is the patient up to date with their annual eye exam?  Yes  Who is the provider or what is the name of the office in which the patient attends annual eye exams? [facility]If pt is not established with a provider, would they like to be referred to a provider to establish care?  N/A .   Dental Screening: Recommended annual dental exams for proper oral hygiene  Community Resource Referral / Chronic Care Management: CRR required this visit?  No   CCM required this visit?  No      Plan:     I have personally reviewed and noted the following in the patient's chart:   Medical and social history Use of alcohol, tobacco or illicit drugs  Current medications and supplements including opioid prescriptions. Patient is not currently taking opioid prescriptions. Functional ability and status Nutritional status Physical activity Advanced directives List of other physicians Hospitalizations, surgeries, and  ER visits in previous 12 months Vitals Screenings to include cognitive, depression, and falls Referrals and appointments  In addition, I have reviewed and discussed with patient certain preventive protocols, quality metrics, and best practice recommendations. A written personalized care plan for preventive services as well as general preventive health recommendations were provided to patient.     Elmon Else, [facility]   10/21/2022

## 2022-10-21 NOTE — Progress Notes (Signed)
Subjective:   Todd Greer is a 67 y.o. male who presents for an Initial Medicare Annual Wellness Visit and chronic care management.    Review of Systems    - He is doing well on diabetes medications.  - Reports since last visit he established with a new cardiologist and going well.   Cardiac Risk Factors include: advanced age (>39mn, >>9women);diabetes mellitus;male gender;hypertension  Objective:    Today's Vitals   10/21/22 0929  BP: 109/79  Pulse: 73  Resp: 16  Temp: 98.3 F (36.8 C)  SpO2: 94%  Weight: 205 lb (93 kg)  PainSc: 0-No pain   Body mass index is 32.11 kg/m.   Physical Exam HENT:     Head: Normocephalic and atraumatic.  Eyes:     Extraocular Movements: Extraocular movements intact.     Conjunctiva/sclera: Conjunctivae normal.     Pupils: Pupils are equal, round, and reactive to light.  Cardiovascular:     Rate and Rhythm: Normal rate and regular rhythm.     Pulses: Normal pulses.     Heart sounds: Normal heart sounds.  Pulmonary:     Effort: Pulmonary effort is normal.     Breath sounds: Normal breath sounds.  Musculoskeletal:     Cervical back: Normal range of motion and neck supple.  Neurological:     General: No focal deficit present.     Mental Status: He is alert and oriented to person, place, and time.  Psychiatric:        Mood and Affect: Mood normal.        Behavior: Behavior normal.       10/21/2022    9:31 AM 09/05/2017    2:40 PM 08/27/2017    9:01 AM 11/24/2014    7:01 PM  Advanced Directives  Does Patient Have a Medical Advance Directive? Yes No No No  Does patient want to make changes to medical advance directive? Yes (Inpatient - patient defers changing a medical advance directive at this time - Information given)     Would patient like information on creating a medical advance directive?  No - Patient declined No - Patient declined Yes - Educational materials given    Current Medications (verified) Outpatient Encounter  Medications as of 10/21/2022  Medication Sig   aspirin 81 MG tablet Take 81 mg by mouth daily.   atorvastatin (LIPITOR) 20 MG tablet Take 1 tablet (20 mg total) by mouth daily.   Blood Glucose Monitoring Suppl (ONE TOUCH ULTRA 2) w/Device KIT 1 Device by Does not apply route 4 (four) times daily -  before meals and at bedtime.   carvedilol (COREG) 12.5 MG tablet Take 1 tablet (12.5 mg total) by mouth 2 (two) times daily.   empagliflozin (JARDIANCE) 10 MG TABS tablet Take 1 tablet (10 mg total) by mouth daily before breakfast.   ENTRESTO 49-51 MG Take 1 tablet by mouth 2 (two) times daily.   glipiZIDE (GLIPIZIDE XL) 10 MG 24 hr tablet Take 2 tablets (20 mg total) by mouth daily.   glucose blood (ONETOUCH ULTRA) test strip Check blood sugars 4 times a day before and after meals   metFORMIN (GLUCOPHAGE) 1000 MG tablet Take 1 tablet (1,000 mg total) by mouth 2 (two) times daily with a meal.   Multiple Vitamin (ONE-A-DAY MENS PO) Take 1 tablet by mouth daily.   OneTouch Delica Lancets [id number] Check blood sugars 4 times a day before and after meals   spironolactone (ALDACTONE) 25 MG tablet Take  1 tablet (25 mg total) by mouth daily.   [DISCONTINUED] empagliflozin (JARDIANCE) 10 MG TABS tablet Take 1 tablet (10 mg total) by mouth daily before breakfast.   [DISCONTINUED] glipiZIDE (GLIPIZIDE XL) 10 MG 24 hr tablet Take 2 tablets (20 mg total) by mouth daily.   [DISCONTINUED] metFORMIN (GLUCOPHAGE) 1000 MG tablet Take 1 tablet (1,000 mg total) by mouth 2 (two) times daily with a meal.   No facility-administered encounter medications on file as of 10/21/2022.    Allergies (verified) Ace inhibitors and Amlodipine   History: Past Medical History:  Diagnosis Date   Anxiety    Chronic combined systolic and diastolic congestive heart failure, NYHA class 2 (Todd Greer) 09/2017   Has been maintained on excellent medications: Carvedilol 12.5 mg BID, Entresto 49 -51 mg p.o. BID, empagliflozin 10 mg daily,  spironolactone 25 mg daily.   Diabetes mellitus type II, controlled, with no complications (HCC)    Hepatitis C, chronic (Todd Greer)    Hypertension    Nonischemic congestive cardiomyopathy (Todd Greer) 09/2017   Admitted for CHF: Echo EF 15 to 20% diffuse HK, severely dilated LA.  Mild RV dilation.  Mild MR.  Catheterization showed EF 25% normal EDP and normal coronaries (has been followed by Dr. Terrence Dupont)   Obesity, Class II, BMI 35-39.9, with comorbidity    Obesity, diabetes, and hypertension syndrome (Todd Greer)    Substance abuse (Todd Greer)    Past Surgical History:  Procedure Laterality Date   LEFT HEART CATH AND CORONARY ANGIOGRAPHY N/A 09/09/2017   Procedure: LEFT HEART CATH AND CORONARY ANGIOGRAPHY;  Surgeon: Charolette Forward, MD;  Location: [facility];  Service: Cardiovascular; EF 15 to 20%.  Diffuse HK.  Normal LVEDP.  Normal coronaries.   NM  EXERCISE MYOVIEW LTD  06/02/2013   Good exercise capacity.  Hypertensive response to exercise.  Dyspneic on exercise but no significant ST changes to suggest ischemia.  Normal stress images.  No ischemia or infarction   TRANSTHORACIC ECHOCARDIOGRAM  09/06/2017   EF 15 to 20%.  Diffuse HK.  Severe LA dilation.  Mild RV dilation.  Mild MR.   Family History  Problem Relation Age of Onset   Stroke Father    Diabetes Father    Hypertension Father    Cancer Sister    Cerebral aneurysm Mother        Died at a young age.   Diabetes Paternal Grandmother    Hypertension Paternal Grandmother    Stroke Paternal Grandmother    Social History   Socioeconomic History   Marital status: Married    Spouse name: Not on file   Number of children: Not on file   Years of education: Not on file   Highest education level: Not on file  Occupational History   Not on file  Tobacco Use   Smoking status: Former    Packs/day: 0.50    Years: 10.00    Total pack years: 5.00    Types: Cigarettes    Quit date: 10/04/1983    Years since quitting: 39.0    Passive exposure:  Past   Smokeless tobacco: Never  Vaping Use   Vaping Use: Never used  Substance and Sexual Activity   Alcohol use: Yes    Alcohol/week: 6.0 standard drinks of alcohol    Types: 6 Standard drinks or equivalent per week   Drug use: No   Sexual activity: Yes  Other Topics Concern   Not on file  Social History Narrative   His married father  of 4.  His married for 28 years.  He lives with his wife and daughters.  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  Education: [employer].   He quit smoking in 1985.  He drinks 3 beers a week.   Prior to the onset of his current symptoms, used to work out with weights for at least 40 minutes a day for 3 times a week.  He usually did light weights for many occasions as opposed to heavy weights.      Contacts: Wife Nicki Reaper; daughter Sandi Mealy   Social Determinants of Health   Financial Resource Strain: Not on file  Food Insecurity: Not on file  Transportation Needs: Not on file  Physical Activity: Not on file  Stress: Not on file  Social Connections: Not on file    Tobacco Counseling Reports he smoked briefly in 1985 about 1/2 PPD. No recent or current smoking.  Clinical Intake:  Pre-visit preparation completed: Yes  Pain : 0-10 Pain Score: 0-No pain   Diabetes: Yes CBG done?: No Did pt. bring in CBG monitor from home?: No  Diabetic-Yes   Activities of Daily Living    10/21/2022    9:31 AM  In your present state of health, do you have any difficulty performing the following activities:  Hearing? 0  Vision? 0  Difficulty concentrating or making decisions? 0  Walking or climbing stairs? 0  Dressing or bathing? 0  Doing errands, shopping? 0  Preparing Food and eating ? N  Using the Toilet? N  In the past six months, have you accidently leaked urine? N  Do you have problems with loss of bowel control? N  Managing your Medications? N  Managing your Finances? N  Housekeeping or  managing your Housekeeping? N    Patient Care Team: Camillia Herter, NP as PCP - General (Nurse Practitioner) Leonie Man, MD as PCP - Cardiology (Cardiology) Carol Ada, MD as Consulting Physician (Gastroenterology) Leonie Man, MD as Consulting Physician (Cardiology) Bjorn Loser, MD as Consulting Physician (Urology)  Indicate any recent Medical Services you may have received from other than [facility] providers in the past year (date may be approximate).  Assessment:   This is a routine wellness examination for [employer].  Hearing/Vision screen Patient established with eye doctor. Patient reports no issues/concerns with hearing.   Dietary issues and exercise activities discussed: Current Exercise Habits: Home exercise routine, Type of exercise: walking, Time (Minutes): 20, Frequency (Times/Week): 4, Weekly Exercise (Minutes/Week): 80, Intensity: Mild, Exercise limited by: None identified  Goals Addressed: States he would like to eventually "come off of" Metformin. States he has been doing his own research and read about the long-term side effects of being on Metformin including vitamin D deficiency. States he is not having any side effects from Metformin otherwise. Reports he is interested in possibly decreasing Metformin dose. I discussed with patient in detail the potential side effects of Metformin and the importance of keeping his routine appointments with primary care to monitor labs. I discussed with patient that due to his hemoglobin A1c increased today at 7.0% compared to previous 6.6% recommendation to continue Metformin at current dose considering he is not experiencing any side effects. I discussed with patient that when he returns for his chronic care management follow-up I will review his hemoglobin A1c to see if appropriate to decrease Metformin dose at that time. Patient verbalized understanding and agreement.    Depression Screen  10/21/2022    9:31 AM  07/29/2022    9:22 AM 04/29/2022    9:39 AM 08/08/2021   10:22 AM 06/08/2021   10:40 AM 03/29/2021    2:15 PM 12/25/2020   11:06 AM  PHQ 2/9 Scores  PHQ - 2 Score 0 0 0 0 0 0 0  PHQ- 9 Score      0 0    Fall Risk    10/21/2022    9:31 AM 07/29/2022    9:22 AM 04/29/2022    9:39 AM 10/09/2021    9:44 AM 06/08/2021   10:40 AM  Fall Risk   Falls in the past year? 0 0 0 0 0  Number falls in past yr: 0 0 0 0   Injury with Fall? 0 0 0 0   Risk for fall due to : No Fall Risks No Fall Risks No Fall Risks No Fall Risks   Follow up Falls evaluation completed Falls evaluation completed Falls evaluation completed      [place]: Any stairs in or around the home? No  If so, are there any without handrails?  N/A Home free of loose throw rugs in walkways, pet beds, electrical cords, etc? Yes  Adequate lighting in your home to reduce risk of falls? Yes   ASSISTIVE DEVICES UTILIZED TO PREVENT FALLS: Life alert? Yes  Use of a cane, walker or w/c? No  Grab bars in the bathroom? No  Shower chair or bench in shower? No  Elevated toilet seat or a handicapped toilet? No   TIMED UP AND GO: Was the test performed? Yes .  Length of time to ambulate 10 feet: 6 sec.   Gait steady and fast without use of assistive device  Cognitive Function:    10/21/2022    9:32 AM  MMSE - Mini Mental State Exam  Orientation to time 5  Orientation to Place 5  Registration 3  Attention/ Calculation 5  Recall 3  Language- name 2 objects 2  Language- repeat 1  Language- follow 3 step command 3  Language- read & follow direction 1  Write a sentence 1  Copy design 1  Total score 30        10/21/2022    9:32 AM  6CIT Screen  What Year? 0 points  What month? 0 points  What time? 0 points  Count back from 20 0 points  Months in reverse 0 points  Repeat phrase 0 points  Total Score 0 points    Immunizations Immunization History  Administered Date(s) Administered    Fluad Quad(high Dose 65+) 07/29/2022   Hepatitis B 09/02/2005   Influenza,inj,Quad PF,6+ Mos 05/28/2013, 06/03/2014, 06/29/2015, 06/08/2021   Influenza-Unspecified 08/18/2020   PFIZER(Purple Top)SARS-COV-2 Vaccination 09/23/2019, 10/14/2019, 08/18/2020   PNEUMOCOCCAL CONJUGATE-20 06/08/2021   Pneumococcal Polysaccharide-23 05/28/2013   Tdap 02/25/2013    TDAP status: Up to date  Flu Vaccine status: Up to date  Pneumococcal vaccine status: Up to date  Covid-19 vaccine status: Completed vaccines  Qualifies for Shingles Vaccine? Yes   Zostavax completed No   Shingrix Completed?: No.    Education has been provided regarding the importance of this vaccine. Patient has been advised to call insurance company to determine out of pocket expense if they have not yet received this vaccine. Advised may also receive vaccine at local pharmacy or Health Dept. Verbalized acceptance and understanding.  Screening Tests Health Maintenance  Topic Date Due   Diabetic kidney evaluation -  Urine ACR  Never done   FOOT EXAM  12/19/2020   COVID-19 Vaccine (4 - 2023-24 season) 05/03/2022   OPHTHALMOLOGY EXAM  08/15/2022   Zoster Vaccines- Shingrix (1 of 2) 01/19/2023 (Originally 05/11/2006)   DTaP/Tdap/Td (2 - Td or Tdap) 02/26/2023   HEMOGLOBIN A1C  04/21/2023   Diabetic kidney evaluation - eGFR measurement  04/30/2023   COLONOSCOPY (Pts 45-63yr Insurance coverage will need to be confirmed)  07/02/2023   Medicare Annual Wellness (AWV)  10/22/2023   Pneumonia Vaccine 67 Years old  Completed   INFLUENZA VACCINE  Completed   Hepatitis C Screening  Completed   HPV VACCINES  Aged Out    Health Maintenance  Health Maintenance Due  Topic Date Due   Diabetic kidney evaluation - Urine ACR  Never done   FOOT EXAM  12/19/2020   COVID-19 Vaccine (4 - 2023-24 season) 05/03/2022   OPHTHALMOLOGY EXAM  08/15/2022    Colorectal cancer screening: Type of screening: Colonoscopy. Completed 07/01/2013. Repeat  every 10 years  Lung Cancer Screening: (Low Dose CT Chest recommended if Age 67-80years, 30 pack-year currently smoking OR have quit w/in 15years.) does not qualify.   Lung Cancer Screening Referral: N/A  Additional Screening:  Hepatitis C Screening: does qualify; Completed 10/09/21  Vision Screening: Recommended annual ophthalmology exams for early detection of glaucoma and other disorders of the eye. Is the patient up to date with their annual eye exam?  Yes  Who is the provider or what is the name of the office in which the patient attends annual eye exams? [facility]If pt is not established with a provider, would they like to be referred to a provider to establish care?  N/A .   Dental Screening: Recommended annual dental exams for proper oral hygiene  Community Resource Referral / Chronic Care Management: CRR required this visit?  No   CCM required this visit?  No   Plan:  1. Encounter for Medicare annual wellness exam - Counseled on 150 minutes of exercise per week as tolerated, healthy eating (including decreased daily intake of saturated fats, cholesterol, added sugars, sodium), STI prevention, and routine healthcare maintenance.  2. Type 2 diabetes mellitus with diabetic polyneuropathy, without long-term current use of insulin (HCC) - Hemoglobin A1c at goal at 7.0%, goal 8%. However, increased compared to previous 6.6%.  - Routine screening.  - Continue Glipizide, Metformin, and Empagliflozin as prescribed. Counseled on medication adherence/adverse effects.  - Discussed the importance of healthy eating habits, low-carbohydrate diet, low-sugar diet, regular aerobic exercise (at least 150 minutes a week as tolerated) and medication compliance to achieve or maintain control of diabetes. - Follow-up with primary provider in 3 months or sooner if needed.  - POCT glycosylated hemoglobin (Hb [id number] - Basic Metabolic Panel - Microalbumin / creatinine urine  ratio - glipiZIDE (GLIPIZIDE XL) 10 MG 24 hr tablet; Take 2 tablets (20 mg total) by mouth daily.  Dispense: 60 tablet; Refill: 2 - metFORMIN (GLUCOPHAGE) 1000 MG tablet; Take 1 tablet (1,000 mg total) by mouth 2 (two) times daily with a meal.  Dispense: 60 tablet; Refill: 2 - empagliflozin (JARDIANCE) 10 MG TABS tablet; Take 1 tablet (10 mg total) by mouth daily before breakfast.  Dispense: 30 tablet; Refill: 2  3. Encounter for vitamin deficiency screening - Routine screening.  - Vitamin D, 25-hydroxy   I have personally reviewed and noted the following in the patient's chart:   Medical and social history Use of alcohol, tobacco  or illicit drugs  Current medications and supplements including opioid prescriptions. Patient is not currently taking opioid prescriptions. Functional ability and status Nutritional status Physical activity Advanced directives List of other physicians Hospitalizations, surgeries, and ER visits in previous 12 months Vitals Screenings to include cognitive, depression, and falls Referrals and appointments  In addition, I have reviewed and discussed with patient certain preventive protocols, quality metrics, and best practice recommendations. A written personalized care plan for preventive services as well as general preventive health recommendations were provided to patient.     Camillia Herter, NP   10/21/2022

## 2022-10-21 NOTE — Patient Instructions (Signed)
Type 2 Diabetes Mellitus, Self-Care, Adult Caring for yourself after you have been diagnosed with type 2 diabetes (type 2 diabetes mellitus) means keeping your blood sugar (glucose) under control with a balance of: Nutrition. Exercise. Lifestyle changes. Medicines or insulin, if needed. Support from your team of health care providers and others. What are the risks? Having type 2 diabetes can put you at risk for other long-term (chronic) conditions, such as heart disease and kidney disease. Your health care provider may prescribe medicines to help prevent complications from diabetes. How to monitor your blood glucose  Check your blood glucose every day or as often as told by your health care provider. Have your A1C (hemoglobin A1C) level checked two or more times a year, or as often as told by your health care provider. Your health care provider will set personalized treatment goals for you. Generally, the goal of treatment is to maintain the following blood glucose levels: Before meals: 80-130 mg/dL (4.4-7.2 mmol/L). After meals: below 180 mg/dL (10 mmol/L). A1C level: less than 7%. How to manage hyperglycemia and hypoglycemia Hyperglycemia symptoms Hyperglycemia, also called high blood glucose, occurs when blood glucose is too high. Make sure you know the early signs of hyperglycemia, such as: Increased thirst. Hunger. Feeling very tired. Needing to urinate more often than usual. Blurry vision. Hypoglycemia symptoms Hypoglycemia, also called low blood glucose, occurs with a blood glucose level at or below 70 mg/dL (3.9 mmol/L). Diabetes medicines lower your blood glucose and can cause hypoglycemia. The risk for hypoglycemia increases during or after exercise, during sleep, during illness, and when skipping meals or not eating for a long time (fasting). It is important to know the symptoms of hypoglycemia and treat it right away. Always have a 15-gram rapid-acting carbohydrate snack with  you to treat low blood glucose. Family members and close friends should also know the symptoms and understand how to treat hypoglycemia, in case you are not able to treat yourself. Symptoms may include: Hunger. Anxiety. Sweating and feeling clammy. Dizziness or feeling light-headed. Sleepiness. Increased heart rate. Irritability. Tingling or numbness around the mouth, lips, or tongue. Restless sleep. Severe hypoglycemia is when your blood glucose level is at or below 54 mg/dL (3 mmol/L). Severe hypoglycemia is an emergency. Do not wait to see if the symptoms will go away. Get medical help right away. Call your local emergency services (911 in the [place] Do not drive yourself to the hospital. If you have severe hypoglycemia and you cannot eat or drink, you may need glucagon. A family member or close friend should learn how to check your blood glucose and how to give you glucagon. Ask your health care provider if you need to have an emergency glucagon kit available. Follow these instructions at home: Medicines Take prescribed insulin or diabetes medicines as told by your health care provider. Do not run out of insulin or other diabetes medicines. Plan ahead so you always have these available. If you use insulin, adjust your dosage based on your physical activity and what foods you eat. Your health care provider will tell you how to adjust your dosage. Take over-the-counter and prescription medicines only as told by your health care provider. Eating and drinking  What you eat and drink affects your blood glucose and your insulin dosage. Making good choices helps to control your diabetes and prevent other health problems. A healthy meal plan includes eating lean proteins, complex carbohydrates, fresh fruits and vegetables, low-fat dairy products, and healthy fats. Make an appointment   to see a registered dietitian to help you create an eating plan that is right for you. Make sure that you: Follow  instructions from your health care provider about eating or drinking restrictions. Drink enough fluid to keep your urine pale yellow. Keep a record of the carbohydrates that you eat. Do this by reading food labels and learning the standard serving sizes of foods. Follow your sick-day plan whenever you cannot eat or drink as usual. Make this plan in advance with your health care provider.  Activity Stay active. Exercise regularly, as told by your health care provider. This may include: Stretching and doing strength exercises, such as yoga or weight lifting, two or more times a week. Doing 150 minutes or more of moderate-intensity or vigorous-intensity exercise each week. This could be brisk walking, biking, or water aerobics. Spread out your activity over 3 or more days of the week. Do not go more than 2 days in a row without doing some kind of physical activity. When you start a new exercise or activity, work with your health care provider to adjust your insulin, medicines, or food intake as needed. Lifestyle Do not use any products that contain nicotine or tobacco. These products include cigarettes, chewing tobacco, and vaping devices, such as e-cigarettes. If you need help quitting, ask your health care provider. If you drink alcohol and your health care provider says that it is safe for you: Limit how much you have to: 0-1 drink a day for women who are not pregnant. 0-2 drinks a day for men. Know how much alcohol is in your drink. In the [place] one drink equals one 12 oz bottle of beer (355 mL), one 5 oz glass of wine (148 mL), or one 1 oz glass of hard liquor (44 mL). Learn to manage stress. If you need help with this, ask your health care provider. Take care of your body  Keep your immunizations up to date. In addition to getting vaccinations as told by your health care provider, it is recommended that you get vaccinated against the following illnesses: The flu (influenza). Get a flu shot  every year. Pneumonia. Hepatitis B. Schedule an eye exam soon after your diagnosis, and then one time every year after that. Check your skin and feet every day for cuts, bruises, redness, blisters, or sores. Schedule a foot exam with your health care provider once every year. Brush your teeth and gums two times a day, and floss one or more times a day. Visit your dentist one or more times every 6 months. Maintain a healthy weight. General instructions Share your diabetes management plan with people in your workplace, school, and household. Carry a medical alert card or wear medical alert jewelry. Keep all follow-up visits. This is important. Questions to ask your health care provider Should I meet with a certified diabetes care and education specialist? Where can I find a support group for people with diabetes? Where to find more information For help and guidance and for more information about diabetes, please visit: American Diabetes Association (ADA): www.diabetes.org American Association of Diabetes Care and Education Specialists (ADCES): www.diabeteseducator.org International Diabetes Federation (IDF): www.idf.org Summary Caring for yourself after you have been diagnosed with type 2 diabetes (type 2 diabetes mellitus) means keeping your blood sugar (glucose) under control with a balance of nutrition, exercise, lifestyle changes, and medicine. Check your blood glucose every day, as often as told by your health care provider. Having diabetes can put you at risk for other long-term (  chronic) conditions, such as heart disease and kidney disease. Your health care provider may prescribe medicines to help prevent complications from diabetes. Share your diabetes management plan with people in your workplace, school, and household. Keep all follow-up visits. This is important. This information is not intended to replace advice given to you by your health care provider. Make sure you discuss any  questions you have with your health care provider. Document Revised: 01/17/2021 Document Reviewed: 01/17/2021 Elsevier Patient Education  2023 Elsevier Inc.  

## 2022-10-22 LAB — VITAMIN D 25 HYDROXY (VIT D DEFICIENCY, FRACTURES): Vit D, 25-Hydroxy: 60.7 ng/mL (ref 30.0–100.0)

## 2022-10-22 LAB — BASIC METABOLIC PANEL
BUN/Creatinine Ratio: 16 (ref 10–24)
BUN: 17 mg/dL (ref 8–27)
CO2: 17 mmol/L — ABNORMAL LOW (ref 20–29)
Calcium: 9.6 mg/dL (ref 8.6–10.2)
Chloride: 104 mmol/L (ref 96–106)
Creatinine, Ser: 1.04 mg/dL (ref 0.76–1.27)
Glucose: 127 mg/dL — ABNORMAL HIGH (ref 70–99)
Potassium: 4.2 mmol/L (ref 3.5–5.2)
Sodium: 139 mmol/L (ref 134–144)
eGFR: 79 mL/min/{1.73_m2} (ref >59)

## 2022-10-22 LAB — MICROALBUMIN / CREATININE URINE RATIO
Creatinine, Urine: 120 mg/dL
Microalb/Creat Ratio: 243 mg/g creat — ABNORMAL HIGH (ref 0–29)
Microalbumin, Urine: 292 ug/mL

## 2022-10-22 LAB — SPECIMEN STATUS REPORT

## 2022-10-23 ENCOUNTER — Other Ambulatory Visit: Payer: Self-pay

## 2022-10-23 NOTE — Progress Notes (Signed)
Hi. The microalbumin creatinine urine ratio is higher than normal. I will forward to his cardiologist as well.   Result note: Microalbumin creatinine urine ratio higher than normal. Considering patient's significant cardiac history will defer management to patient's cardiologist Glenetta Hew, MD. This should be discussed during patient's next appointment with his cardiologist. Note - I did consult with Dorna Mai, MD.

## 2022-10-28 ENCOUNTER — Ambulatory Visit: Payer: Medicare Other | Admitting: Family

## 2022-10-29 ENCOUNTER — Other Ambulatory Visit: Payer: Self-pay

## 2022-10-29 NOTE — Progress Notes (Signed)
Dr. Redmond Pulling,  Please advise. Thank you.

## 2022-10-30 ENCOUNTER — Other Ambulatory Visit: Payer: Self-pay

## 2022-11-07 ENCOUNTER — Encounter: Payer: Self-pay | Admitting: Urology

## 2022-11-07 ENCOUNTER — Ambulatory Visit: Payer: Medicare Other | Admitting: Urology

## 2022-11-07 VITALS — BP 120/81 | HR 81 | Ht 67.0 in | Wt 207.0 lb

## 2022-11-07 DIAGNOSIS — N529 Male erectile dysfunction, unspecified: Secondary | ICD-10-CM

## 2022-11-07 MED ORDER — TADALAFIL 5 MG PO TABS: 5.0000 mg | ORAL_TABLET | Freq: Every day | ORAL | 11 refills | Status: DC

## 2022-11-07 NOTE — Progress Notes (Signed)
Assessment: 1. Organic impotence     Plan: I personally reviewed the patient's chart including provider notes, lab results. Today I had a long discussion with the patient spending a total of 15 minutes discussing ED.  I discussed the pathophysiology, etiology, and natural history of ED as well as management options using a goal-oriented approach.  We discussed the following options: Medical therapy, vacuum erection device, penile injections, intraurethral suppository therapy (MUSE), and penile prosthesis. Patient educational materials concerning these options were given to the patient.  Trial of tadalafil 5 mg daily.  Rx provided Return to office in 6 weeks DRE and PSA next visit   Chief Complaint:  Chief Complaint  Patient presents with   Erectile Dysfunction    History of Present Illness:  Todd Greer is a 67 y.o. male who is seen for evaluation of erectile dysfunction. He reports gradual onset of erectile dysfunction for approximately 2 years.  He is currently able to achieve only a partial erection with 30 to 40% rigidity.  He has difficulty maintaining his erections as well.  He is unable to engage in intercourse due to the poor rigidity.  No pain or curvature with erections.  No nocturnal or early morning erections.  No decrease in his libido.  No prior treatment.  He has urinary symptoms including frequency, urgency, nocturia, intermittent stream, and hesitancy.  The symptoms have been present for 5-6 years.  No dysuria or gross hematuria. IPSS = 16 today.  No PSA results available for review.   Past Medical History:  Past Medical History:  Diagnosis Date   Anxiety    Chronic combined systolic and diastolic congestive heart failure, NYHA class 2 ([place]) 09/2017   Has been maintained on excellent medications: Carvedilol 12.5 mg BID, Entresto 49 -51 mg p.o. BID, empagliflozin 10 mg daily, spironolactone 25 mg daily.   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  Mild RV dilation.  Mild MR.  Catheterization showed EF 25% normal EDP and normal coronaries (has been followed by Dr. Terrence Dupont)   Obesity, Class II, BMI 35-39.9, with comorbidity    Obesity, diabetes, and hypertension syndrome ([place])    Substance abuse ([place])     Past Surgical History:  Past Surgical History:  Procedure Laterality Date   LEFT HEART CATH AND CORONARY ANGIOGRAPHY N/A 09/09/2017   Procedure: LEFT HEART CATH AND CORONARY ANGIOGRAPHY;  Surgeon: Charolette Forward, MD;  Location: [facility];  Service: Cardiovascular; EF 15 to 20%.  Diffuse HK.  Normal LVEDP.  Normal coronaries.   NM  EXERCISE MYOVIEW LTD  06/02/2013   Good exercise capacity.  Hypertensive response to exercise.  Dyspneic on exercise but no significant ST changes to suggest ischemia.  Normal stress images.  No ischemia or infarction   TRANSTHORACIC ECHOCARDIOGRAM  09/06/2017   EF 15 to 20%.  Diffuse HK.  Severe LA dilation.  Mild RV dilation.  Mild MR.    Allergies:  Allergies  Allergen Reactions   Ace Inhibitors Cough    Tolerates ARB   Amlodipine Other (See Comments)    Fatigue    Family History:  Family History  Problem Relation Age of Onset   Stroke Father    Diabetes Father    Hypertension Father    Cancer Sister  Cerebral aneurysm Mother        Died at a young age.   Diabetes Paternal Grandmother    Hypertension Paternal Grandmother    Stroke Paternal Grandmother     Social History:  Social History   Tobacco Use   Smoking status: Former    Packs/day: 0.50    Years: 10.00    Total pack years: 5.00    Types: Cigarettes    Quit date: 10/04/1983    Years since quitting: 39.1    Passive exposure: Past   Smokeless tobacco: Never  Vaping Use   Vaping Use: Never used  Substance Use Topics    Alcohol use: Yes    Alcohol/week: 6.0 standard drinks of alcohol    Types: 6 Standard drinks or equivalent per week   Drug use: No    Review of symptoms:  Constitutional:  Negative for unexplained weight loss, night sweats, fever, chills ENT:  Negative for nose bleeds, sinus pain, painful swallowing CV:  Negative for chest pain, shortness of breath, exercise intolerance, palpitations, loss of consciousness Resp:  Negative for cough, wheezing, shortness of breath GI:  Negative for nausea, vomiting, diarrhea, bloody stools GU:  Positives noted in HPI; otherwise negative for gross hematuria, dysuria, urinary incontinence Neuro:  Negative for seizures, poor balance, limb weakness, slurred speech Psych:  Negative for lack of energy, depression, anxiety Endocrine:  Negative for polydipsia, polyuria, symptoms of hypoglycemia (dizziness, hunger, sweating) Hematologic:  Negative for anemia, purpura, petechia, prolonged or excessive bleeding, use of anticoagulants  Allergic:  Negative for difficulty breathing or choking as a result of exposure to anything; no shellfish allergy; no allergic response (rash/itch) to materials, foods  Physical exam: BP 120/81   Pulse 81   Ht '5\' 7"'$  (1.702 m)   Wt 207 lb (93.9 kg)   BMI 32.42 kg/m  GENERAL APPEARANCE:  Well appearing, well developed, well nourished, NAD HEENT: Atraumatic, Normocephalic, oropharynx clear. NECK: Supple without lymphadenopathy or thyromegaly. LUNGS: Clear to auscultation bilaterally. HEART: Regular Rate and Rhythm without murmurs, gallops, or rubs. ABDOMEN: Soft, non-tender, No Masses. EXTREMITIES: Moves all extremities well.  Without clubbing, cyanosis, or edema. NEUROLOGIC:  Alert and oriented x 3, normal gait, CN II-XII grossly intact.  MENTAL STATUS:  Appropriate. BACK:  Non-tender to palpation.  No CVAT SKIN:  Warm, dry and intact.   GU: Penis:  circumcised Meatus: Normal Scrotum: normal, no masses Testis: normal without  masses bilateral Epididymis: normal   Results: None

## 2022-11-26 ENCOUNTER — Other Ambulatory Visit: Payer: Self-pay

## 2022-11-26 ENCOUNTER — Other Ambulatory Visit: Payer: Self-pay | Admitting: Cardiovascular Disease

## 2022-11-26 ENCOUNTER — Encounter: Payer: Self-pay | Admitting: Cardiovascular Disease

## 2022-11-26 ENCOUNTER — Ambulatory Visit: Payer: Medicare Other | Attending: Cardiovascular Disease | Admitting: Cardiovascular Disease

## 2022-11-26 VITALS — BP 108/66 | HR 78 | Ht 67.0 in | Wt 209.4 lb

## 2022-11-26 DIAGNOSIS — I493 Ventricular premature depolarization: Secondary | ICD-10-CM

## 2022-11-26 DIAGNOSIS — R008 Other abnormalities of heart beat: Secondary | ICD-10-CM | POA: Diagnosis not present

## 2022-11-26 DIAGNOSIS — I5032 Chronic diastolic (congestive) heart failure: Secondary | ICD-10-CM | POA: Diagnosis not present

## 2022-11-26 MED ORDER — MAGNESIUM 400 MG PO CAPS: 400.0000 mg | ORAL_CAPSULE | Freq: Two times a day (BID) | ORAL | Status: DC

## 2022-11-26 MED ORDER — DILTIAZEM HCL ER COATED BEADS 180 MG PO CP24
180.0000 mg | ORAL_CAPSULE | Freq: Every day | ORAL | 1 refills | Status: DC
  Filled 2022-11-26: qty 30, 30d supply, fill #0
  Filled 2022-12-21: qty 30, 30d supply, fill #1

## 2022-11-26 NOTE — Progress Notes (Signed)
Electrophysiology Office Note:    Date:  11/26/2022   ID:  Sanil Roerig, DOB Sep 16, 1955, MRN [id number]  PCP:  Camillia Herter, NP   [facility] Cardiologist:  Glenetta Hew, MD Electrophysiologist:  Melida Quitter, MD     Referring MD: Leonie Man, MD   History of Present Illness:    Todd Greer is a 67 y.o. male with a hx listed below, significant for CHF with recovered EF, hepatitis C, frequent PVCs referred for arrhythmia management.  January 2019, he had heart failure with severely reduced EF (20%).  His LV function recovered with GDMT.  He noticed decreased pulse rates in November 2023.  Specifically, heart rates were at lowest 30s at nighttime.  Zio patch was placed in January that showed 7% PVC burden.  Past Medical History:  Diagnosis Date   Anxiety    Chronic combined systolic and diastolic congestive heart failure, NYHA class 2 ([place]) 09/2017   Has been maintained on excellent medications: Carvedilol 12.5 mg BID, Entresto 49 -51 mg p.o. BID, empagliflozin 10 mg daily, spironolactone 25 mg daily.   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  Mild RV dilation.  Mild MR.  Catheterization showed EF 25% normal EDP and normal coronaries (has been followed by Dr. Terrence Dupont)   Obesity, Class II, BMI 35-39.9, with comorbidity    Obesity, diabetes, and hypertension syndrome ([place])    Substance abuse ([place])     Past Surgical History:  Procedure Laterality Date   LEFT HEART CATH AND CORONARY ANGIOGRAPHY N/A 09/09/2017   Procedure: LEFT HEART CATH AND CORONARY ANGIOGRAPHY;  Surgeon: Charolette Forward, MD;  Location: [facility];  Service: Cardiovascular; EF 15 to 20%.  Diffuse HK.  Normal LVEDP.  Normal coronaries.   NM  EXERCISE MYOVIEW LTD  06/02/2013    Good exercise capacity.  Hypertensive response to exercise.  Dyspneic on exercise but no significant ST changes to suggest ischemia.  Normal stress images.  No ischemia or infarction   TRANSTHORACIC ECHOCARDIOGRAM  09/06/2017   EF 15 to 20%.  Diffuse HK.  Severe LA dilation.  Mild RV dilation.  Mild MR.    Current Medications: Current Meds  Medication Sig   aspirin 81 MG tablet Take 81 mg by mouth daily.   atorvastatin (LIPITOR) 20 MG tablet Take 1 tablet (20 mg total) by mouth daily.   Blood Glucose Monitoring Suppl (ONE TOUCH ULTRA 2) w/Device KIT 1 Device by Does not apply route 4 (four) times daily -  before meals and at bedtime.   diltiazem (CARDIZEM CD) 180 MG 24 hr capsule Take 1 capsule (180 mg total) by mouth daily.   empagliflozin (JARDIANCE) 10 MG TABS tablet Take 1 tablet (10 mg total) by mouth daily before breakfast.   ENTRESTO 49-51 MG Take 1 tablet by mouth 2 (two) times daily.   glipiZIDE (GLIPIZIDE XL) 10 MG 24 hr tablet Take 2 tablets (20 mg total) by mouth daily.   glucose blood (ONETOUCH ULTRA) test strip Check blood sugars 4 times a day before and after meals   Magnesium 400 MG CAPS Take 400 mg by mouth 2 (two) times daily.   metFORMIN (GLUCOPHAGE) 1000 MG tablet Take 1 tablet (1,000 mg total) by mouth 2 (two) times daily with a  meal.   Multiple Vitamin (ONE-A-DAY MENS PO) Take 1 tablet by mouth daily.   OneTouch Delica Lancets [id number] MISC Check blood sugars 4 times a day before and after meals   spironolactone (ALDACTONE) 25 MG tablet Take 1 tablet (25 mg total) by mouth daily.   tadalafil (CIALIS) 5 MG tablet Take 1 tablet (5 mg total) by mouth daily.   [DISCONTINUED] carvedilol (COREG) 12.5 MG tablet Take 1 tablet (12.5 mg total) by mouth 2 (two) times daily.     Allergies:   Ace inhibitors and Amlodipine   Social and Family History: Reviewed in Epic  ROS:   Please see the history of present illness.    All other systems reviewed and are  negative.  EKGs/Labs/Other Studies Reviewed Today:    Echocardiogram:  TTE 09/11/2023  EF 60-65%, moderate LVH   Monitors:  09/2022 Zio monitor 27% PVCs, essentially monomorphic  Stress testing:  2014 - negative myoview    Advanced imaging:     EKG:  Last EKG results: today - sinus rhythm. PVCs in trigeminy. Two morphologies.   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 Superiorly-directed axis (-III, aVF; +II); ++I, aVL; -aVR Ablation may be difficult based upon morphology of dominant PVC and presence of multiple morphologies. Will try magnesium taurate, CCB first (looks like he took dilt ain 2014-2016). Stop carvedilol, start diltiazem 180.  Heart failure with recovered EF EF 15-20%  in 2019 Etiology -- HTN? EtOH/substance abuse? PVCs?   Follow-up 4-6 weeks. If no improvement, we can discuss ablation at that time.         Medication Adjustments/Labs and Tests Ordered: Current medicines are reviewed at length with the patient today.  Concerns regarding medicines are outlined above.  Orders Placed This Encounter  Procedures   EKG 12-Lead   Meds ordered this encounter  Medications   diltiazem (CARDIZEM CD) 180 MG 24 hr capsule    Sig: Take 1 capsule (180 mg  total) by mouth daily.    Dispense:  30 capsule    Refill:  1   Magnesium 400 MG CAPS    Sig: Take 400 mg by mouth 2 (two) times daily.     Signed, Melida Quitter, MD  11/26/2022 10:51 AM    [place]

## 2022-11-26 NOTE — Patient Instructions (Addendum)
Medication Instructions:  Your physician has recommended you make the following change in your medication:  1) STOP taking carvedilol 2) START taking diltiazem 180 mg daily  3) START taking magnesium tartrate 400 mg twice daily *If you need a refill on your cardiac medications before your next appointment, please call your pharmacy*  Follow-Up: At [facility], you and your health needs are our priority.  As part of our continuing mission to provide you with exceptional heart care, we have created designated Provider Care Teams.  These Care Teams include your primary Cardiologist (physician) and Advanced Practice Providers (APPs -  Physician Assistants and Nurse Practitioners) who all work together to provide you with the care you need, when you need it.  Your next appointment:   4 week(s)  Provider:   Doralee Albino, MD

## 2022-11-27 ENCOUNTER — Other Ambulatory Visit: Payer: Self-pay

## 2022-11-28 ENCOUNTER — Other Ambulatory Visit: Payer: Self-pay

## 2022-12-06 ENCOUNTER — Other Ambulatory Visit: Payer: Self-pay

## 2022-12-19 ENCOUNTER — Ambulatory Visit: Payer: Medicare Other | Admitting: Urology

## 2022-12-19 ENCOUNTER — Encounter: Payer: Self-pay | Admitting: Urology

## 2022-12-19 NOTE — Progress Notes (Deleted)
Assessment: 1. Organic impotence      Plan: I personally reviewed the patient's chart including provider notes, lab results. Today I had a long discussion with the patient spending a total of 15 minutes discussing ED.  I discussed the pathophysiology, etiology, and natural history of ED as well as management options using a goal-oriented approach.  We discussed the following options: Medical therapy, vacuum erection device, penile injections, intraurethral suppository therapy (MUSE), and penile prosthesis. Patient educational materials concerning these options were given to the patient.  Trial of tadalafil 5 mg daily.  Rx provided Return to office in 6 weeks DRE and PSA next visit   Chief Complaint:  No chief complaint on file.   History of Present Illness:  Todd Greer is a 67 y.o. male who is seen for continued evaluation of erectile dysfunction. He reported gradual onset of erectile dysfunction for approximately 2 years.  He has been able to achieve only a partial erection with 30 to 40% rigidity and difficulty maintaining his erections as well.  He has been unable to engage in intercourse due to the poor rigidity.  No pain or curvature with erections.  No nocturnal or early morning erections.  No decrease in his libido.  No prior treatment.  He has urinary symptoms including frequency, urgency, nocturia, intermittent stream, and hesitancy.  The symptoms have been present for 5-6 years.  No dysuria or gross hematuria. IPSS = 16.  No PSA results available for review.  He was given a trial of tadalafil 5 mg daily at his visit in March 2024.  Portions of the above documentation were copied from a prior visit for review purposes only.  Past Medical History:  Past Medical History:  Diagnosis Date   Anxiety    Chronic combined systolic and diastolic congestive heart failure, NYHA class 2 (HCC) 09/2017   Has been maintained on excellent medications: Carvedilol 12.5 mg BID,  Entresto 49 -51 mg p.o. BID, empagliflozin 10 mg daily, spironolactone 25 mg daily.   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  Mild RV dilation.  Mild MR.  Catheterization showed EF 25% normal EDP and normal coronaries (has been followed by Dr. Sharyn Lull)   Obesity, Class II, BMI 35-39.9, with comorbidity    Obesity, diabetes, and hypertension syndrome (HCC)    Substance abuse (HCC)     Past Surgical History:  Past Surgical History:  Procedure Laterality Date   LEFT HEART CATH AND CORONARY ANGIOGRAPHY N/A 09/09/2017   Procedure: LEFT HEART CATH AND CORONARY ANGIOGRAPHY;  Surgeon: Rinaldo Cloud, MD;  [facility]: [facility];  Service: Cardiovascular; EF 15 to 20%.  Diffuse HK.  Normal LVEDP.  Normal coronaries.   NM  EXERCISE MYOVIEW LTD  06/02/2013   Good exercise capacity.  Hypertensive response to exercise.  Dyspneic on exercise but no significant ST changes to suggest ischemia.  Normal stress images.  No ischemia or infarction   TRANSTHORACIC ECHOCARDIOGRAM  09/06/2017   EF 15 to 20%.  Diffuse HK.  Severe LA dilation.  Mild RV dilation.  Mild MR.    Allergies:  Allergies  Allergen Reactions   Ace Inhibitors Cough    Tolerates ARB   Amlodipine Other (See Comments)    Fatigue    Family History:  Family History  Problem Relation Age of Onset   Stroke Father    Diabetes Father    Hypertension Father    Cancer Sister    Cerebral aneurysm Mother        Died at a young age.   Diabetes Paternal Grandmother    Hypertension Paternal Grandmother    Stroke Paternal Grandmother     Social History:  Social History   Tobacco Use   Smoking status: Former    Packs/day: 0.50    Years: 10.00    Additional pack years: 0.00    Total pack years: 5.00    Types: Cigarettes    Quit  date: 10/04/1983    Years since quitting: 39.2    Passive exposure: Past   Smokeless tobacco: Never  Vaping Use   Vaping Use: Never used  Substance Use Topics   Alcohol use: Yes    Alcohol/week: 6.0 standard drinks of alcohol    Types: 6 Standard drinks or equivalent per week   Drug use: No    ROS: Constitutional:  Negative for fever, chills, weight loss CV: Negative for chest pain, previous MI, hypertension Respiratory:  Negative for shortness of breath, wheezing, sleep apnea, frequent cough GI:  Negative for nausea, vomiting, bloody stool, GERD  Physical exam: There were no vitals taken for this visit. GENERAL APPEARANCE:  Well appearing, well developed, well nourished, NAD HEENT:  Atraumatic, normocephalic, oropharynx clear NECK:  Supple without lymphadenopathy or thyromegaly ABDOMEN:  Soft, non-tender, no masses EXTREMITIES:  Moves all extremities well, without clubbing, cyanosis, or edema NEUROLOGIC:  Alert and oriented x 3, normal gait, CN II-XII grossly intact MENTAL STATUS:  appropriate BACK:  Non-tender to palpation, No CVAT SKIN:  Warm, dry, and intact  Results: None

## 2022-12-21 ENCOUNTER — Other Ambulatory Visit: Payer: Self-pay | Admitting: Cardiovascular Disease

## 2022-12-23 ENCOUNTER — Other Ambulatory Visit: Payer: Self-pay

## 2022-12-23 MED ORDER — MAGNESIUM 400 MG PO CAPS: 400.0000 mg | ORAL_CAPSULE | Freq: Two times a day (BID) | ORAL | 3 refills | Status: DC

## 2022-12-25 ENCOUNTER — Other Ambulatory Visit: Payer: Self-pay

## 2023-01-08 ENCOUNTER — Encounter: Payer: Self-pay | Admitting: Urology

## 2023-01-08 ENCOUNTER — Ambulatory Visit: Payer: Medicare Other | Admitting: Urology

## 2023-01-08 VITALS — BP 132/92 | HR 91 | Ht 67.0 in | Wt 207.0 lb

## 2023-01-08 DIAGNOSIS — Z125 Encounter for screening for malignant neoplasm of prostate: Secondary | ICD-10-CM | POA: Diagnosis not present

## 2023-01-08 DIAGNOSIS — N529 Male erectile dysfunction, unspecified: Secondary | ICD-10-CM

## 2023-01-08 LAB — MICROSCOPIC EXAMINATION
Crystal Type: NONE SEEN
Crystals: NONE SEEN
Epithelial Cells (non renal): NONE SEEN /hpf (ref 0–10)
Trichomonas, UA: NONE SEEN
Yeast, UA: NONE SEEN

## 2023-01-08 LAB — URINALYSIS, ROUTINE W REFLEX MICROSCOPIC
Bilirubin, UA: NEGATIVE
Glucose, UA: NEGATIVE
Leukocytes,UA: NEGATIVE
Nitrite, UA: NEGATIVE
RBC, UA: NEGATIVE
Specific Gravity, UA: 1.03 (ref 1.005–1.030)
Urobilinogen, Ur: 0.2 mg/dL (ref 0.2–1.0)
pH, UA: 5.5 (ref 5.0–7.5)

## 2023-01-08 MED ORDER — TADALAFIL 20 MG PO TABS: 20.0000 mg | ORAL_TABLET | Freq: Every day | ORAL | 11 refills | Status: DC | PRN

## 2023-01-08 NOTE — Progress Notes (Signed)
Assessment: 1. Organic impotence    Plan: Trial of tadalafil 20 mg daily.  Rx provided PSA today Will call with results I will discussed additional options for management of his erectile dysfunction including vacuum erection device, penile injections, intraurethral suppositories, and penile prosthesis. He will let me know the results of the as needed tadalafil.  Chief Complaint:  Chief Complaint  Patient presents with   Erectile Dysfunction    History of Present Illness:  Todd Greer is a 67 y.o. male who is seen for continued evaluation of erectile dysfunction. He reported gradual onset of erectile dysfunction for approximately 2 years.  He has been able to achieve only a partial erection with 30 to 40% rigidity and difficulty maintaining his erections as well.  He has been unable to engage in intercourse due to the poor rigidity.  No pain or curvature with erections.  No nocturnal or early morning erections.  No decrease in his libido.  No prior treatment.  He has urinary symptoms including frequency, urgency, nocturia, intermittent stream, and hesitancy.  The symptoms have been present for 5-6 years.  No dysuria or gross hematuria. IPSS = 16.  No PSA results available for review.  He was given a trial of tadalafil 5 mg daily at his visit in March 2024.  Urine today for follow-up.  He has not seen a significant improvement in his erectile dysfunction with the daily tadalafil.  He continues to have partial erections which are not adequate for intercourse.  He also has rapid detumescence.  No pain or curvature with erections.  He reports better results with the tadalafil 20 mg as needed.  He would like to try this again. His lower urinary tract symptoms are unchanged.  He continues to have frequency urgency, and sensation of incomplete emptying.  No dysuria or gross hematuria. IPSS = 20 today.  Portions of the above documentation were copied from a prior visit for review purposes  only.  Past Medical History:  Past Medical History:  Diagnosis Date   Anxiety    Chronic combined systolic and diastolic congestive heart failure, NYHA class 2 (HCC) 09/2017   Has been maintained on excellent medications: Carvedilol 12.5 mg BID, Entresto 49 -51 mg p.o. BID, empagliflozin 10 mg daily, spironolactone 25 mg daily.   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  Mild RV dilation.  Mild MR.  Catheterization showed EF 25% normal EDP and normal coronaries (has been followed by Dr. Sharyn Lull)   Obesity, Class II, BMI 35-39.9, with comorbidity    Obesity, diabetes, and hypertension syndrome (HCC)    Substance abuse (HCC)     Past Surgical History:  Past Surgical History:  Procedure Laterality Date   LEFT HEART CATH AND CORONARY ANGIOGRAPHY N/A 09/09/2017   Procedure: LEFT HEART CATH AND CORONARY ANGIOGRAPHY;  Surgeon: Rinaldo Cloud, MD;  Location: [facility];  Service: Cardiovascular; EF 15 to 20%.  Diffuse HK.  Normal LVEDP.  Normal coronaries.   NM  EXERCISE MYOVIEW LTD  06/02/2013   Good exercise capacity.  Hypertensive response to exercise.  Dyspneic on exercise but no significant ST changes to suggest ischemia.  Normal stress images.  No ischemia or infarction   TRANSTHORACIC ECHOCARDIOGRAM  09/06/2017   EF 15 to 20%.  Diffuse HK.  Severe LA dilation.  Mild RV dilation.  Mild MR.    Allergies:  Allergies  Allergen Reactions   Ace Inhibitors Cough    Tolerates ARB   Amlodipine Other (See Comments)    Fatigue    Family History:  Family History  Problem Relation Age of Onset   Stroke Father    Diabetes Father    Hypertension Father    Cancer Sister    Cerebral aneurysm Mother        Died at a young age.   Diabetes Paternal Grandmother    Hypertension Paternal  Grandmother    Stroke Paternal Grandmother     Social History:  Social History   Tobacco Use   Smoking status: Former    Packs/day: 0.50    Years: 10.00    Additional pack years: 0.00    Total pack years: 5.00    Types: Cigarettes    Quit date: 10/04/1983    Years since quitting: 39.2    Passive exposure: Past   Smokeless tobacco: Never  Vaping Use   Vaping Use: Never used  Substance Use Topics   Alcohol use: Yes    Alcohol/week: 6.0 standard drinks of alcohol    Types: 6 Standard drinks or equivalent per week   Drug use: No    ROS: Constitutional:  Negative for fever, chills, weight loss CV: Negative for chest pain, previous MI, hypertension Respiratory:  Negative for shortness of breath, wheezing, sleep apnea, frequent cough GI:  Negative for nausea, vomiting, bloody stool, GERD  Physical exam: BP (!) 132/92   Pulse 91   Ht 5\' 7"  (1.702 m)   Wt 207 lb (93.9 kg)   BMI 32.42 kg/m  GENERAL APPEARANCE:  Well appearing, well developed, well nourished, NAD HEENT:  Atraumatic, normocephalic, oropharynx clear NECK:  Supple without lymphadenopathy or thyromegaly ABDOMEN:  Soft, non-tender, no masses EXTREMITIES:  Moves all extremities well, without clubbing, cyanosis, or edema NEUROLOGIC:  Alert and oriented x 3, normal gait, CN II-XII grossly intact MENTAL STATUS:  appropriate BACK:  Non-tender to palpation, No CVAT SKIN:  Warm, dry, and intact GU: Prostate: 40 g, NT, no nodules Rectum: Normal tone,  no masses or tenderness   Results: U/A:  0-5 WBC, 0-2 RBC, mod bacteria

## 2023-01-09 ENCOUNTER — Encounter: Payer: Self-pay | Admitting: Urology

## 2023-01-09 LAB — PSA: Prostate Specific Ag, Serum: 1.3 ng/mL (ref 0.0–4.0)

## 2023-01-10 ENCOUNTER — Encounter: Payer: Self-pay | Admitting: Cardiovascular Disease

## 2023-01-10 ENCOUNTER — Ambulatory Visit: Payer: Medicare Other | Attending: Cardiovascular Disease | Admitting: Cardiovascular Disease

## 2023-01-10 ENCOUNTER — Other Ambulatory Visit: Payer: Self-pay

## 2023-01-10 VITALS — BP 118/72 | HR 82 | Ht 67.0 in | Wt 204.8 lb

## 2023-01-10 DIAGNOSIS — I493 Ventricular premature depolarization: Secondary | ICD-10-CM | POA: Diagnosis not present

## 2023-01-10 MED ORDER — DILTIAZEM HCL ER COATED BEADS 240 MG PO CP24
240.0000 mg | ORAL_CAPSULE | Freq: Every day | ORAL | 3 refills | Status: DC
  Filled 2023-01-10 (×2): qty 90, 90d supply, fill #0
  Filled 2023-03-25 – 2023-04-01 (×2): qty 90, 90d supply, fill #1
  Filled 2023-07-04 (×2): qty 90, 90d supply, fill #2
  Filled 2023-10-08: qty 90, 90d supply, fill #3

## 2023-01-10 NOTE — Patient Instructions (Signed)
Medication Instructions:  INCREASE Diltiazem CD to 240mg  once daily  *If you need a refill on your cardiac medications before your next appointment, please call your pharmacy*   Testing/Procedures: PVC Ablation Your physician has recommended that you have an ablation. Catheter ablation is a medical procedure used to treat some cardiac arrhythmias (irregular heartbeats). During catheter ablation, a long, thin, flexible tube is put into a blood vessel in your groin (upper thigh), or neck. This tube is called an ablation catheter. It is then guided to your heart through the blood vessel. Radio frequency waves destroy small areas of heart tissue where abnormal heartbeats may cause an arrhythmia to start. Please see the instruction sheet given to you today.  You are scheduled for  PVC Ablation  on Tuesday, November 5 with Dr. Halford Chessman.Please arrive at the Main Entrance A at [facility]: [address] [place], [place] [address] at 10:30 AM     Follow-Up: At [facility], you and your health needs are our priority.  As part of our continuing mission to provide you with exceptional heart care, we have created designated Provider Care Teams.  These Care Teams include your primary Cardiologist (physician) and Advanced Practice Providers (APPs -  Physician Assistants and Nurse Practitioners) who all work together to provide you with the care you need, when you need it.  We recommend signing up for the patient portal called "MyChart".  Sign up information is provided on this After Visit Summary.  MyChart is used to connect with patients for Virtual Visits (Telemedicine).  Patients are able to view lab/test results, encounter notes, upcoming appointments, etc.  Non-urgent messages can be sent to your provider as well.   To learn more about what you can do with MyChart, go to [email or web address].    Your next appointment:   Tuesday, 06/17/23  Provider:   York Pellant, MD

## 2023-01-10 NOTE — Progress Notes (Signed)
Electrophysiology Office Note:    Date:  01/10/2023   ID:  Todd Greer, DOB 1955/10/22, MRN [id number]  PCP:  Rema Fendt, NP   [facility] Providers Cardiologist:  Bryan Lemma, MD Electrophysiologist:  Maurice Small, MD     Referring MD: Rema Fendt, NP   History of Present Illness:    Todd Greer is a 67 y.o. male with a hx listed below, significant for CHF with recovered EF, hepatitis C, frequent PVCs referred for arrhythmia management.  January 2019, he had heart failure with severely reduced EF (20%).  His LV function recovered with GDMT.  He noticed decreased pulse rates in November 2023.  Specifically, heart rates were at lowest 30s at nighttime.  Zio patch was placed in January that showed 7% PVC burden.  01/10/2023  At his last visit, we stopped his beta-blocker and started diltiazem and magnesium.  He reports that he no longer has muscle soreness with the diltiazem and magnesium.  His fatigue is somewhat improved but not resolved.  Past Medical History:  Diagnosis Date   Anxiety    Chronic combined systolic and diastolic congestive heart failure, NYHA class 2 (HCC) 09/2017   Has been maintained on excellent medications: Carvedilol 12.5 mg BID, Entresto 49 -51 mg p.o. BID, empagliflozin 10 mg daily, spironolactone 25 mg daily.   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  Mild RV dilation.  Mild MR.  Catheterization showed EF 25% normal EDP and normal coronaries (has been followed by Dr. Sharyn Lull)   Obesity, Class II, BMI 35-39.9, with comorbidity    Obesity, diabetes, and hypertension syndrome (HCC)    Substance abuse (HCC)     Past Surgical History:  Procedure Laterality Date   LEFT HEART CATH AND CORONARY ANGIOGRAPHY N/A 09/09/2017   Procedure:  LEFT HEART CATH AND CORONARY ANGIOGRAPHY;  Surgeon: Rinaldo Cloud, MD;  Location: [facility];  Service: Cardiovascular; EF 15 to 20%.  Diffuse HK.  Normal LVEDP.  Normal coronaries.   NM  EXERCISE MYOVIEW LTD  06/02/2013   Good exercise capacity.  Hypertensive response to exercise.  Dyspneic on exercise but no significant ST changes to suggest ischemia.  Normal stress images.  No ischemia or infarction   TRANSTHORACIC ECHOCARDIOGRAM  09/06/2017   EF 15 to 20%.  Diffuse HK.  Severe LA dilation.  Mild RV dilation.  Mild MR.    Current Medications: Current Meds  Medication Sig   aspirin 81 MG tablet Take 81 mg by mouth daily.   atorvastatin (LIPITOR) 20 MG tablet Take 1 tablet (20 mg total) by mouth daily.   Blood Glucose Monitoring Suppl (ONE TOUCH ULTRA 2) w/Device KIT 1 Device by Does not apply route 4 (four) times daily -  before meals and at bedtime.   diltiazem (CARDIZEM CD) 180 MG 24 hr capsule Take 1 capsule (180 mg total) by mouth daily.   empagliflozin (JARDIANCE) 10 MG TABS tablet Take 1 tablet (10 mg total) by mouth daily before breakfast.   ENTRESTO 49-51 MG Take 1 tablet by mouth 2 (two) times daily.   glipiZIDE (GLIPIZIDE XL) 10 MG 24 hr tablet Take 2 tablets (20 mg total) by mouth daily.   glucose blood (ONETOUCH ULTRA) test strip Check blood sugars 4 times a day  before and after meals   Magnesium 400 MG CAPS Take 400 mg by mouth 2 (two) times daily.   metFORMIN (GLUCOPHAGE) 1000 MG tablet Take 1 tablet (1,000 mg total) by mouth 2 (two) times daily with a meal.   Multiple Vitamin (ONE-A-DAY MENS PO) Take 1 tablet by mouth daily.   OneTouch Delica Lancets 33G MISC Check blood sugars 4 times a day before and after meals   spironolactone (ALDACTONE) 25 MG tablet Take 1 tablet (25 mg total) by mouth daily.   tadalafil (CIALIS) 20 MG tablet Take 1 tablet (20 mg total) by mouth daily as needed for erectile dysfunction.     Allergies:   Ace inhibitors and Amlodipine    Social and Family History: Reviewed in Epic  ROS:   Please see the history of present illness.    All other systems reviewed and are negative.  EKGs/Labs/Other Studies Reviewed Today:    Echocardiogram:  TTE 09/11/2023  EF 60-65%, moderate LVH   Monitors:  09/2022 Zio monitor 27% PVCs, essentially monomorphic  Stress testing:  2014 - negative myoview    Advanced imaging:    EKG:  Last EKG results: today - sinus rhythm. PVCs in trigeminy. Single PVC morphology. 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 Superiorly-directed axis (-III, aVF; +II); ++I, aVL; -aVR Continue magnesium. Increase dilltiazem to 240 mg Ablation may be difficult based upon morphology of dominant PVC and presence of multiple morphologies. --However, since he is still having symptoms, and there appears to be a single morphology today, we will go ahead and schedule the ablation procedure, which will be for several months.  We will follow-up to see how is doing on the higher dose of diltiazem prior to that time.  We discussed the indication, rationale, logistics, anticipated benefits, and potential risks of the ablation procedure including but not limited to -- bleed at the groin  access site, chest pain, damage to nearby organs such as the diaphragm, lungs, or esophagus, need for a drainage tube, or prolonged hospitalization. I explained that the risk for stroke, heart attack, need for open chest surgery, or even death is very low but not zero. he  expressed understanding and wishes to proceed.   Heart failure with recovered EF EF 15-20%  in 2019, normalized on TTE 09/2022 Etiology -- HTN? EtOH/substance abuse? PVCs? -- PVCs seem unlikely since he continued to have PVCs after EF normalized.      Medication Adjustments/Labs and Tests Ordered: Current medicines are reviewed at length with the patient today.  Concerns regarding medicines are outlined above.  Orders Placed This Encounter  Procedures  EKG 12-Lead   No orders of the defined types were placed in this encounter.    Signed, Maurice Small, MD  01/10/2023 10:17 AM     [facility]

## 2023-01-15 ENCOUNTER — Other Ambulatory Visit: Payer: Self-pay

## 2023-01-15 NOTE — Progress Notes (Deleted)
Patient ID: Todd Greer, male    DOB: December 07, 1955  MRN: [id number]  CC: Chronic Care Management   Subjective: Todd Greer is a 67 y.o. male who presents for chronic care management.   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/23/2015   Hypertrophy of prostate without urinary obstruction and other lower urinary tract symptoms (LUTS) 12/03/2013   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     atorvastatin (LIPITOR) 20 MG tablet Take 1 tablet (20 mg total) by mouth daily. 90 tablet 0   Blood Glucose Monitoring Suppl (ONE TOUCH ULTRA 2) w/Device KIT 1 Device by Does not apply route 4 (four) times daily -  before meals and at bedtime. 1 kit 0   diltiazem (CARDIZEM CD) 240 MG 24 hr capsule Take 1 capsule (240 mg total) by mouth daily. 90 capsule 3   empagliflozin (JARDIANCE) 10 MG TABS tablet Take 1 tablet (10 mg total) by mouth daily before breakfast. 30 tablet 2   ENTRESTO 49-51 MG Take 1 tablet by  mouth 2 (two) times daily.     glipiZIDE (GLIPIZIDE XL) 10 MG 24 hr tablet Take 2 tablets (20 mg total) by mouth daily. 60 tablet 2   glucose blood (ONETOUCH ULTRA) test strip Check blood sugars 4 times a day before and after meals 100 each 12   Magnesium 400 MG CAPS Take 400 mg by mouth 2 (two) times daily. 90 capsule 3   metFORMIN (GLUCOPHAGE) 1000 MG tablet Take 1 tablet (1,000 mg total) by mouth 2 (two) times daily with a meal. 60 tablet 2   Multiple Vitamin (ONE-A-DAY MENS PO) Take 1 tablet by mouth daily.     OneTouch Delica Lancets 33G MISC Check blood sugars 4 times a day before and after meals 100 each 12   spironolactone (ALDACTONE) 25 MG tablet Take 1 tablet (25 mg total) by mouth daily. 90 tablet 0   tadalafil (CIALIS) 20 MG tablet Take 1 tablet (20 mg total) by mouth daily as needed for erectile dysfunction. 10 tablet 11   No current facility-administered medications on file prior to visit.    Allergies  Allergen Reactions   Ace Inhibitors Cough    Tolerates ARB   Amlodipine Other (See Comments)    Fatigue    Social History   Socioeconomic History   Marital status: Married    Spouse name: Not on file   Number of children: Not on file   Years of education: Not on file   Highest education level:  Not on file  Occupational History   Not on file  Tobacco Use   Smoking status: Former    Packs/day: 0.50    Years: 10.00    Additional pack years: 0.00    Total pack years: 5.00    Types: Cigarettes    Quit date: 10/04/1983    Years since quitting: 39.3    Passive exposure: Past   Smokeless tobacco: Never  Vaping Use   Vaping Use: Never used  Substance and Sexual Activity   Alcohol use: Yes    Alcohol/week: 6.0 standard drinks of alcohol    Types: 6 Standard drinks or equivalent per week   Drug use: No   Sexual activity: Yes  Other Topics Concern   Not on file  Social History Narrative   His married father of 4.  His married for 28 years.  He lives with his wife  and daughters.  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  Education: [employer].   He quit smoking in 1985.  He drinks 3 beers a week.   Prior to the onset of his current symptoms, used to work out with weights for at least 40 minutes a day for 3 times a week.  He usually did light weights for many occasions as opposed to heavy weights.      Contacts: Wife [name]; daughter [name]   Social Determinants of Health   Financial Resource Strain: Not on file  Food Insecurity: Not on file  Transportation Needs: Not on file  Physical Activity: Not on file  Stress: Not on file  Social Connections: Not on file  Intimate Partner Violence: Not on file    Family History  Problem Relation Age of Onset   Stroke Father    Diabetes Father    Hypertension Father    Cancer Sister    Cerebral aneurysm Mother        Died at a young age.   Diabetes Paternal Grandmother    Hypertension Paternal Grandmother    Stroke Paternal Grandmother     Past Surgical History:  Procedure Laterality Date   LEFT HEART CATH AND CORONARY ANGIOGRAPHY N/A 09/09/2017   Procedure: LEFT HEART CATH AND CORONARY ANGIOGRAPHY;  Surgeon: Rinaldo Cloud, MD;  Location: [facility];  Service: Cardiovascular; EF 15 to 20%.  Diffuse HK.  Normal LVEDP.  Normal coronaries.   NM  EXERCISE MYOVIEW LTD  06/02/2013   Good exercise capacity.  Hypertensive response to exercise.  Dyspneic on exercise but no significant ST changes to suggest ischemia.  Normal stress images.  No ischemia or infarction   TRANSTHORACIC ECHOCARDIOGRAM  09/06/2017   EF 15 to 20%.  Diffuse HK.  Severe LA dilation.  Mild RV dilation.  Mild MR.    ROS: Review of Systems Negative except as stated above  PHYSICAL EXAM: There were no vitals taken for this visit.  Physical Exam  {male adult master:310786} {male adult master:[id number]}     Latest Ref Rng & Units 10/21/2022   10:29 AM 04/29/2022    10:09 AM 07/06/2021    8:02 PM  CMP  Glucose 70 - 99 mg/dL 161  096  79   BUN 8 - 27 mg/dL 17  16  18    Creatinine 0.76 - 1.27 mg/dL 0.45  4.09  8.11   Sodium 134 - 144 mmol/L 139  137  136   Potassium 3.5 - 5.2 mmol/L 4.2  4.5  4.5  Chloride 96 - 106 mmol/L 104  103  104   CO2 20 - 29 mmol/L 17  20  22    Calcium 8.6 - 10.2 mg/dL 9.6  9.8  9.5   Total Protein 6.0 - 8.5 g/dL  7.2    Total Bilirubin 0.0 - 1.2 mg/dL  0.5    Alkaline Phos 44 - 121 IU/L  45    AST 0 - 40 IU/L  43    ALT 0 - 44 IU/L  41     Lipid Panel     Component Value Date/Time   CHOL 141 10/25/2020 0915   TRIG 76 10/25/2020 0915   HDL 53 10/25/2020 0915   CHOLHDL 2.7 10/25/2020 0915   CHOLHDL 3.8 09/08/2017 0437   VLDL 16 09/08/2017 0437   LDLCALC 73 10/25/2020 0915    CBC    Component Value Date/Time   WBC 5.4 07/06/2021 2002   RBC 5.22 07/06/2021 2002   HGB 14.5 07/06/2021 2002   HGB 15.3 10/25/2020 0915   HCT 43.4 07/06/2021 2002   HCT 46.7 10/25/2020 0915   PLT 248 07/06/2021 2002   PLT 237 10/25/2020 0915   MCV 83.1 07/06/2021 2002   MCV 85 10/25/2020 0915   MCH 27.8 07/06/2021 2002   MCHC 33.4 07/06/2021 2002   RDW 14.1 07/06/2021 2002   RDW 13.3 10/25/2020 0915   LYMPHSABS 2.5 07/06/2021 2002   MONOABS 0.9 07/06/2021 2002   EOSABS 0.2 07/06/2021 2002   BASOSABS 0.1 07/06/2021 2002    ASSESSMENT AND PLAN:  There are no diagnoses linked to this encounter.   Patient was given the opportunity to ask questions.  Patient verbalized understanding of the plan and was able to repeat key elements of the plan. Patient was given clear instructions to go to Emergency Department or return to medical center if symptoms don't improve, worsen, or new problems develop.The patient verbalized understanding.   No orders of the defined types were placed in this encounter.    Requested Prescriptions    No prescriptions requested or ordered in this encounter    No follow-ups on file.  Rema Fendt, NP

## 2023-01-17 ENCOUNTER — Other Ambulatory Visit: Payer: Self-pay | Admitting: Family

## 2023-01-17 ENCOUNTER — Other Ambulatory Visit: Payer: Self-pay

## 2023-01-17 DIAGNOSIS — I10 Essential (primary) hypertension: Secondary | ICD-10-CM

## 2023-01-17 DIAGNOSIS — I5022 Chronic systolic (congestive) heart failure: Secondary | ICD-10-CM

## 2023-01-17 DIAGNOSIS — E1142 Type 2 diabetes mellitus with diabetic polyneuropathy: Secondary | ICD-10-CM

## 2023-01-17 MED ORDER — METFORMIN HCL 1000 MG PO TABS
1000.0000 mg | ORAL_TABLET | Freq: Two times a day (BID) | ORAL | 0 refills | Status: DC
Start: 2023-01-17 — End: 2023-03-25
  Filled 2023-01-17: qty 180, 90d supply, fill #0

## 2023-01-17 MED ORDER — SPIRONOLACTONE 25 MG PO TABS
25.0000 mg | ORAL_TABLET | Freq: Every day | ORAL | 0 refills | Status: DC
Start: 2023-01-17 — End: 2023-03-25
  Filled 2023-01-17: qty 90, 90d supply, fill #0

## 2023-01-17 MED ORDER — GLIPIZIDE ER 10 MG PO TB24
20.0000 mg | ORAL_TABLET | Freq: Every day | ORAL | 0 refills | Status: DC
Start: 2023-01-17 — End: 2023-03-25
  Filled 2023-01-17: qty 180, 90d supply, fill #0

## 2023-01-17 NOTE — Telephone Encounter (Signed)
Requested Prescriptions  Pending Prescriptions Disp Refills   glipiZIDE (GLIPIZIDE XL) 10 MG 24 hr tablet 180 tablet 0    Sig: Take 2 tablets (20 mg total) by mouth daily.     Endocrinology:  Diabetes - Sulfonylureas Passed - 01/17/2023  5:21 PM      Passed - HBA1C is between 0 and 7.9 and within 180 days    HbA1c, POC (controlled diabetic range)  Date Value Ref Range Status  10/21/2022 7.0 0.0 - 7.0 % Final         Passed - Cr in normal range and within 360 days    Creat  Date Value Ref Range Status  06/03/2014 0.86 0.50 - 1.35 mg/dL Final   Creatinine, Ser  Date Value Ref Range Status  10/21/2022 1.04 0.76 - 1.27 mg/dL Final         Passed - Valid encounter within last 6 months    Recent Outpatient Visits           2 months ago Encounter for Medicare annual wellness exam   [place] [facility], Amy J, NP   5 months ago Type 2 diabetes mellitus with diabetic polyneuropathy, without long-term current use of insulin (HCC)   [place] [facility], Amy J, NP   8 months ago Type 2 diabetes mellitus with diabetic polyneuropathy, without long-term current use of insulin (HCC)   [place] [facility], Amy J, NP   1 year ago Type 2 diabetes mellitus with diabetic polyneuropathy, without long-term current use of insulin (HCC)   [place] [facility] at [facility], Amy J, NP   1 year ago Nonintractable headache, unspecified chronicity pattern, unspecified headache type   [facility] [place], NP       Future Appointments             In 3 days Rema Fendt, NP [place] [facility] at [facility]   In 5 months Mealor, Roberts Gaudy, MD [facility]             metFORMIN (GLUCOPHAGE) 1000 MG tablet 180 tablet 0    Sig: Take 1 tablet (1,000 mg total) by mouth 2 (two) times daily with a meal.      Endocrinology:  Diabetes - Biguanides Failed - 01/17/2023  5:21 PM      Failed - CBC within normal limits and completed in the last 12 months    WBC  Date Value Ref Range Status  07/06/2021 5.4 4.0 - 10.5 K/uL Final   RBC  Date Value Ref Range Status  07/06/2021 5.22 4.22 - 5.81 MIL/uL Final   Hemoglobin  Date Value Ref Range Status  07/06/2021 14.5 13.0 - 17.0 g/dL Final  16/06/9603 54.0 13.0 - 17.7 g/dL Final   HCT  Date Value Ref Range Status  07/06/2021 43.4 39.0 - 52.0 % Final   Hematocrit  Date Value Ref Range Status  10/25/2020 46.7 37.5 - 51.0 % Final   MCHC  Date Value Ref Range Status  07/06/2021 33.4 30.0 - 36.0 g/dL Final   [facility]  Date Value Ref Range Status  07/06/2021 27.8 26.0 - 34.0 pg Final   MCV  Date Value Ref Range Status  07/06/2021 83.1 80.0 - 100.0 fL Final  10/25/2020 85 79 - 97 fL Final   Platelet Count, POC  Date  Value Ref Range Status  05/25/2015 196 142 - 424 K/uL Final   RDW  Date Value Ref Range Status  07/06/2021 14.1 11.5 - 15.5 % Final  10/25/2020 13.3 11.6 - 15.4 % Final   RDW, POC  Date Value Ref Range Status  05/25/2015 13.8 % Final         Passed - Cr in normal range and within 360 days    Creat  Date Value Ref Range Status  06/03/2014 0.86 0.50 - 1.35 mg/dL Final   Creatinine, Ser  Date Value Ref Range Status  10/21/2022 1.04 0.76 - 1.27 mg/dL Final         Passed - HBA1C is between 0 and 7.9 and within 180 days    HbA1c, POC (controlled diabetic range)  Date Value Ref Range Status  10/21/2022 7.0 0.0 - 7.0 % Final         Passed - eGFR in normal range and within 360 days    GFR, Est African American  Date Value Ref Range Status  06/03/2014 >89 mL/min Final   GFR calc Af Amer  Date Value Ref Range Status  10/25/2020 77 >59 mL/min/1.73 Final    Comment:    **In accordance with recommendations from the NKF-ASN Task force,**   [facility] is in the process of updating its eGFR calculation to the   2021  CKD-EPI creatinine equation that estimates kidney function   without a race variable.    GFR, Est Non African American  Date Value Ref Range Status  06/03/2014 >89 mL/min Final    Comment:      The estimated GFR is a calculation valid for adults (>=62 years old) that uses the CKD-EPI algorithm to adjust for age and sex. It is   not to be used for children, pregnant women, hospitalized patients,    patients on dialysis, or with rapidly changing kidney function. According to the NKDEP, eGFR >89 is normal, 60-89 shows mild impairment, 30-59 shows moderate impairment, 15-29 shows severe impairment and <15 is ESRD.     GFR, Estimated  Date Value Ref Range Status  07/06/2021 >60 >60 mL/min Final    Comment:    (NOTE) Calculated using the CKD-EPI Creatinine Equation (2021)    eGFR  Date Value Ref Range Status  10/21/2022 79 >59 mL/min/1.73 Final         Passed - B12 Level in normal range and within 720 days    Vitamin B-12  Date Value Ref Range Status  08/08/2021 656 232 - 1,245 pg/mL Final         Passed - Valid encounter within last 6 months    Recent Outpatient Visits           2 months ago Encounter for Medicare annual wellness exam   [place] [facility], Amy J, NP   5 months ago Type 2 diabetes mellitus with diabetic polyneuropathy, without long-term current use of insulin (HCC)   [place] [facility] at [facility], Amy J, NP   8 months ago Type 2 diabetes mellitus with diabetic polyneuropathy, without long-term current use of insulin (HCC)   [place] [facility] at [facility], Amy J, NP   1 year ago Type 2 diabetes mellitus with diabetic polyneuropathy, without long-term current use of insulin (HCC)    [facility] at [facility], [place] J, NP   1 year ago Nonintractable headache, unspecified chronicity pattern, unspecified headache type  [facility] at [facility], [place], NP       Future Appointments             In 3 days Rema Fendt, NP [facility]   In 5 months Mealor, Roberts Gaudy, MD [facility] at [facility], [facility]

## 2023-01-17 NOTE — Telephone Encounter (Signed)
Requested Prescriptions  Pending Prescriptions Disp Refills   spironolactone (ALDACTONE) 25 MG tablet 90 tablet 0    Sig: Take 1 tablet (25 mg total) by mouth daily.     Cardiovascular: Diuretics - Aldosterone Antagonist Passed - 01/17/2023  5:21 PM      Passed - Cr in normal range and within 180 days    Creat  Date Value Ref Range Status  06/03/2014 0.86 0.50 - 1.35 mg/dL Final   Creatinine, Ser  Date Value Ref Range Status  10/21/2022 1.04 0.76 - 1.27 mg/dL Final         Passed - K in normal range and within 180 days    Potassium  Date Value Ref Range Status  10/21/2022 4.2 3.5 - 5.2 mmol/L Final         Passed - Na in normal range and within 180 days    Sodium  Date Value Ref Range Status  10/21/2022 139 134 - 144 mmol/L Final         Passed - eGFR is 30 or above and within 180 days    GFR, Est African American  Date Value Ref Range Status  06/03/2014 >89 mL/min Final   GFR calc Af Amer  Date Value Ref Range Status  10/25/2020 77 >59 mL/min/1.73 Final    Comment:    **In accordance with recommendations from the NKF-ASN Task force,**   [facility] is in the process of updating its eGFR calculation to the   2021 CKD-EPI creatinine equation that estimates kidney function   without a race variable.    GFR, Est Non African American  Date Value Ref Range Status  06/03/2014 >89 mL/min Final    Comment:      The estimated GFR is a calculation valid for adults (>=30 years old) that uses the CKD-EPI algorithm to adjust for age and sex. It is   not to be used for children, pregnant women, hospitalized patients,    patients on dialysis, or with rapidly changing kidney function. According to the NKDEP, eGFR >89 is normal, 60-89 shows mild impairment, 30-59 shows moderate impairment, 15-29 shows severe impairment and <15 is ESRD.     GFR, Estimated  Date Value Ref Range Status  07/06/2021 >60 >60 mL/min Final    Comment:    (NOTE) Calculated using the CKD-EPI Creatinine  Equation (2021)    eGFR  Date Value Ref Range Status  10/21/2022 79 >59 mL/min/1.73 Final         Passed - Last BP in normal range    BP Readings from Last 1 Encounters:  01/10/23 118/72         Passed - Valid encounter within last 6 months    Recent Outpatient Visits           2 months ago Encounter for Medicare annual wellness exam   [place] [facility], Amy J, NP   5 months ago Type 2 diabetes mellitus with diabetic polyneuropathy, without long-term current use of insulin (HCC)   [place] [facility], Amy J, NP   8 months ago Type 2 diabetes mellitus with diabetic polyneuropathy, without long-term current use of insulin (HCC)   [place] [facility] at [facility], Amy J, NP   1 year ago Type 2 diabetes mellitus with diabetic polyneuropathy, without long-term current use of insulin [facility])   [place] [facility], Salomon Fick, NP   1  year ago Nonintractable headache, unspecified chronicity pattern, unspecified headache type   [place] [facility] at [facility], [place], NP       Future Appointments             In 3 days Rema Fendt, NP [facility] at [facility]   In 5 months Mealor, Roberts Gaudy, MD [facility] at [facility], [facility]             atorvastatin (LIPITOR) 20 MG tablet 90 tablet 0    Sig: Take 1 tablet (20 mg total) by mouth daily.     Cardiovascular:  Antilipid - Statins Failed - 01/17/2023  5:21 PM      Failed - Lipid Panel in normal range within the last 12 months    Cholesterol, Total  Date Value Ref Range Status  10/25/2020 141 100 - 199 mg/dL Final   LDL Chol Calc (NIH)  Date Value Ref Range Status  10/25/2020 73 0 - 99 mg/dL Final   HDL  Date Value Ref Range Status  10/25/2020 53 >39 mg/dL Final   Triglycerides  Date Value Ref Range Status  10/25/2020 76 0 - 149 mg/dL Final          Passed - Patient is not pregnant      Passed - Valid encounter within last 12 months    Recent Outpatient Visits           2 months ago Encounter for [employer] annual wellness exam   [place] [facility], Amy J, NP   5 months ago Type 2 diabetes mellitus with diabetic polyneuropathy, without long-term current use of insulin (HCC)   [place] [facility] at [facility], Amy J, NP   8 months ago Type 2 diabetes mellitus with diabetic polyneuropathy, without long-term current use of insulin (HCC)   [place] [facility] at [facility], Amy J, NP   1 year ago Type 2 diabetes mellitus with diabetic polyneuropathy, without long-term current use of insulin (HCC)   [place] [facility] at [facility], Amy J, NP   1 year ago Nonintractable headache, unspecified chronicity pattern, unspecified headache type   [facility] at [facility], Salomon Fick, NP       Future Appointments             In 3 days Rema Fendt, NP [facility] at [facility]   In 5 months Mealor, Roberts Gaudy, MD [facility] at [facility], [facility]

## 2023-01-17 NOTE — Telephone Encounter (Signed)
Requested medication (s) are due for refill today: yes  Requested medication (s) are on the active medication list: yes  Last refill:  10/21/22 #90/0  Future visit scheduled: yes  Notes to clinic:  Unable to refill per protocol due to failed labs, no updated results.    Requested Prescriptions  Pending Prescriptions Disp Refills   atorvastatin (LIPITOR) 20 MG tablet 90 tablet 0    Sig: Take 1 tablet (20 mg total) by mouth daily.     Cardiovascular:  Antilipid - Statins Failed - 01/17/2023  5:21 PM      Failed - Lipid Panel in normal range within the last 12 months    Cholesterol, Total  Date Value Ref Range Status  10/25/2020 141 100 - 199 mg/dL Final   LDL Chol Calc (NIH)  Date Value Ref Range Status  10/25/2020 73 0 - 99 mg/dL Final   HDL  Date Value Ref Range Status  10/25/2020 53 >39 mg/dL Final   Triglycerides  Date Value Ref Range Status  10/25/2020 76 0 - 149 mg/dL Final         Passed - Patient is not pregnant      Passed - Valid encounter within last 12 months    Recent Outpatient Visits           2 months ago Encounter for [employer] annual wellness exam   [place] [facility], Amy J, NP   5 months ago Type 2 diabetes mellitus with diabetic polyneuropathy, without long-term current use of insulin (HCC)   [place] [facility], Amy J, NP   8 months ago Type 2 diabetes mellitus with diabetic polyneuropathy, without long-term current use of insulin (HCC)   [place] [facility], Amy J, NP   1 year ago Type 2 diabetes mellitus with diabetic polyneuropathy, without long-term current use of insulin (HCC)   [place] [facility] at [facility], Amy J, NP   1 year ago Nonintractable headache, unspecified chronicity pattern, unspecified headache type   [facility], Salomon Fick, NP       Future Appointments             In 3  days Rema Fendt, NP [facility] at [facility]   In 5 months Mealor, Roberts Gaudy, MD [facility]            Signed Prescriptions Disp Refills   spironolactone (ALDACTONE) 25 MG tablet 90 tablet 0    Sig: Take 1 tablet (25 mg total) by mouth daily.     Cardiovascular: Diuretics - Aldosterone Antagonist Passed - 01/17/2023  5:21 PM      Passed - Cr in normal range and within 180 days    Creat  Date Value Ref Range Status  06/03/2014 0.86 0.50 - 1.35 mg/dL Final   Creatinine, Ser  Date Value Ref Range Status  10/21/2022 1.04 0.76 - 1.27 mg/dL Final         Passed - K in normal range and within 180 days    Potassium  Date Value Ref Range Status  10/21/2022 4.2 3.5 - 5.2 mmol/L Final         Passed - Na in normal range and within 180 days    Sodium  Date Value Ref Range Status  10/21/2022 139 134 - 144 mmol/L Final  Passed - eGFR is 30 or above and within 180 days    GFR, Est African American  Date Value Ref Range Status  06/03/2014 >89 mL/min Final   GFR calc Af Amer  Date Value Ref Range Status  10/25/2020 77 >59 mL/min/1.73 Final    Comment:    **In accordance with recommendations from the NKF-ASN Task force,**   [facility] is in the process of updating its eGFR calculation to the   2021 CKD-EPI creatinine equation that estimates kidney function   without a race variable.    GFR, Est Non African American  Date Value Ref Range Status  06/03/2014 >89 mL/min Final    Comment:      The estimated GFR is a calculation valid for adults (>=12 years old) that uses the CKD-EPI algorithm to adjust for age and sex. It is   not to be used for children, pregnant women, hospitalized patients,    patients on dialysis, or with rapidly changing kidney function. According to the NKDEP, eGFR >89 is normal, 60-89 shows mild impairment, 30-59 shows moderate impairment, 15-29 shows severe impairment and <15 is  ESRD.     GFR, Estimated  Date Value Ref Range Status  07/06/2021 >60 >60 mL/min Final    Comment:    (NOTE) Calculated using the CKD-EPI Creatinine Equation (2021)    eGFR  Date Value Ref Range Status  10/21/2022 79 >59 mL/min/1.73 Final         Passed - Last BP in normal range    BP Readings from Last 1 Encounters:  01/10/23 118/72         Passed - Valid encounter within last 6 months    Recent Outpatient Visits           2 months ago Encounter for [employer] annual wellness exam   [place] [facility], Amy J, NP   5 months ago Type 2 diabetes mellitus with diabetic polyneuropathy, without long-term current use of insulin (HCC)   [place] [facility], Amy J, NP   8 months ago Type 2 diabetes mellitus with diabetic polyneuropathy, without long-term current use of insulin (HCC)   [place] [facility] at [facility], Amy J, NP   1 year ago Type 2 diabetes mellitus with diabetic polyneuropathy, without long-term current use of insulin (HCC)   [place] [facility] at [facility], Amy J, NP   1 year ago Nonintractable headache, unspecified chronicity pattern, unspecified headache type   [facility] at [facility], Salomon Fick, NP       Future Appointments             In 3 days Rema Fendt, NP [facility] at [facility]   In 5 months Mealor, Roberts Gaudy, MD [facility] at [facility], [facility]

## 2023-01-20 ENCOUNTER — Ambulatory Visit: Payer: Medicare Other | Admitting: Family

## 2023-01-20 ENCOUNTER — Other Ambulatory Visit: Payer: Self-pay

## 2023-01-20 DIAGNOSIS — E1142 Type 2 diabetes mellitus with diabetic polyneuropathy: Secondary | ICD-10-CM

## 2023-01-21 NOTE — Progress Notes (Signed)
Patient ID: Todd Greer, male    DOB: 04/12/1956  MRN: [id number]  CC: Chronic Care Management   Subjective: Todd Greer is a 67 y.o. male who presents for chronic care management.   His concerns today include:  - Doing well on Metformin and Glipizide, no issues/concerns. Reports he has not taken Jardiance for several months due to health insurance does not cover full cost and he has to pay $173 out of pocket. He is monitoring what he eats and routinely exercising. Home blood sugars in the 100's. He denies red flag symptoms associated with diabetes. - Would like vitamin B12 and vitamin D levels checked. - No further issues/concerns for discussion today.   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     atorvastatin (LIPITOR) 20 MG tablet Take 1 tablet (20 mg total) by mouth daily. 90 tablet 0   Blood Glucose  Monitoring Suppl (ONE TOUCH ULTRA 2) w/Device KIT 1 Device by Does not apply route 4 (four) times daily -  before meals and at bedtime. 1 kit 0   diltiazem (CARDIZEM CD) 240 MG 24 hr capsule Take 1 capsule (240 mg total) by mouth daily. 90 capsule 3   ENTRESTO 49-51 MG Take 1 tablet by mouth 2 (two) times daily.     glipiZIDE (GLIPIZIDE XL) 10 MG 24 hr tablet Take 2 tablets (20 mg total) by mouth daily. 180 tablet 0   glucose blood (ONETOUCH ULTRA) test strip Check blood sugars 4 times a day before and after meals 100 each 12   Magnesium 400 MG CAPS Take 400 mg by mouth 2 (two) times daily. 90 capsule 3   metFORMIN (GLUCOPHAGE) 1000 MG tablet Take 1 tablet (1,000 mg total) by mouth 2 (two) times daily with a meal. 180 tablet 0   Multiple Vitamin (ONE-A-DAY MENS PO) Take 1 tablet by mouth daily.     OneTouch Delica Lancets 33G MISC Check blood sugars 4 times a day before and after meals 100 each 12   spironolactone (ALDACTONE) 25 MG tablet Take 1 tablet (25 mg total) by mouth daily. 90 tablet 0   tadalafil (CIALIS) 20 MG tablet Take 1 tablet (20 mg total) by mouth daily as needed for erectile dysfunction. 10 tablet 11   No current facility-administered medications on file prior to visit.    Allergies  Allergen Reactions   Ace Inhibitors Cough    Tolerates ARB   Amlodipine Other (See Comments)  Fatigue    Social History   Socioeconomic History   Marital status: Married    Spouse name: Not on file   Number of children: Not on file   Years of education: Not on file   Highest education level: Not on file  Occupational History   Not on file  Tobacco Use   Smoking status: Former    Packs/day: 0.50    Years: 10.00    Additional pack years: 0.00    Total pack years: 5.00    Types: Cigarettes    Quit date: 10/04/1983    Years since quitting: 39.3    Passive exposure: Past   Smokeless tobacco: Never  Vaping Use   Vaping Use: Never used  Substance and Sexual Activity   Alcohol use:  Yes    Alcohol/week: 6.0 standard drinks of alcohol    Types: 6 Standard drinks or equivalent per week   Drug use: No   Sexual activity: Yes  Other Topics Concern   Not on file  Social History Narrative   His married father of 4.  His married for 28 years.  He lives with his wife and daughters.  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  Education: [employer].   He quit smoking in 1985.  He drinks 3 beers a week.   Prior to the onset of his current symptoms, used to work out with weights for at least 40 minutes a day for 3 times a week.  He usually did light weights for many occasions as opposed to heavy weights.      Contacts: Wife [name]; daughter [name]   Social Determinants of Health   Financial Resource Strain: Not on file  Food Insecurity: Not on file  Transportation Needs: Not on file  Physical Activity: Not on file  Stress: Not on file  Social Connections: Not on file  Intimate Partner Violence: Not on file    Family History  Problem Relation Age of Onset   Stroke Father    Diabetes Father    Hypertension Father    Cancer Sister    Cerebral aneurysm Mother        Died at a young age.   Diabetes Paternal Grandmother    Hypertension Paternal Grandmother    Stroke Paternal Grandmother     Past Surgical History:  Procedure Laterality Date   LEFT HEART CATH AND CORONARY ANGIOGRAPHY N/A 09/09/2017   Procedure: LEFT HEART CATH AND CORONARY ANGIOGRAPHY;  Surgeon: Rinaldo Cloud, MD;  Location: [facility];  Service: Cardiovascular; EF 15 to 20%.  Diffuse HK.  Normal LVEDP.  Normal coronaries.   NM  EXERCISE MYOVIEW LTD  06/02/2013   Good exercise capacity.  Hypertensive response to exercise.  Dyspneic on exercise but no significant ST changes to suggest ischemia.  Normal stress images.  No ischemia or infarction   TRANSTHORACIC ECHOCARDIOGRAM  09/06/2017   EF 15 to 20%.  Diffuse HK.  Severe LA dilation.  Mild RV  dilation.  Mild MR.    ROS: Review of Systems Negative except as stated above  PHYSICAL EXAM: BP 124/78 (BP Location: Right Arm, Patient Position: Sitting, Cuff Size: Normal)   Pulse (!) 55   Temp 98.7 F (37.1 C)   Resp 14   Ht 5\' 7"  (1.702 m)   Wt 205 lb 3.2 oz (93.1 kg)   SpO2 97%   BMI 32.14 kg/m   Physical Exam HENT:  Head: Normocephalic and atraumatic.  Eyes:     Extraocular Movements: Extraocular movements intact.     Conjunctiva/sclera: Conjunctivae normal.     Pupils: Pupils are equal, round, and reactive to light.  Cardiovascular:     Rate and Rhythm: Normal rate and regular rhythm.     Pulses: Normal pulses.     Heart sounds: Normal heart sounds.  Pulmonary:     Effort: Pulmonary effort is normal.     Breath sounds: Normal breath sounds.  Musculoskeletal:     Cervical back: Normal range of motion and neck supple.  Neurological:     General: No focal deficit present.     Mental Status: He is alert and oriented to person, place, and time.  Psychiatric:        Mood and Affect: Mood normal.        Behavior: Behavior normal.     Results for orders placed or performed in visit on 01/29/23  POCT glycosylated hemoglobin (Hb A1C)  Result Value Ref Range   Hemoglobin A1C     HbA1c POC (<> result, manual entry)     HbA1c, POC (prediabetic range)     HbA1c, POC (controlled diabetic range) 6.5 0.0 - 7.0 %    ASSESSMENT AND PLAN: 1. Type 2 diabetes mellitus with hyperglycemia, without long-term current use of insulin (HCC) - Hemoglobin A1c at goal at 6.5%, goal < 8%. This is improved compared to previous 7.0%.  - Continue Metformin ad Glipizide as prescribed. No refills needed as of present.  -  Empagliflozin discontinued due to out of pocket cost for patient.  - Discussed the importance of healthy eating habits, low-carbohydrate diet, low-sugar diet, regular aerobic exercise (at least 150 minutes a week as tolerated) and medication compliance to achieve or  maintain control of diabetes. - Follow-up with primary provider in 3 months or sooner if needed.  - POCT glycosylated hemoglobin (Hb A1C)  2. Diabetic eye exam [facility]) - Referral to Ophthalmology for further evaluation/management. - Ambulatory referral to Ophthalmology  3. Encounter for diabetic foot exam [facility]) - Referral to Podiatry for further evaluation/management. - Ambulatory referral to Podiatry  4. Encounter for vitamin deficiency screening - Routine screening.  - Vitamin D, 25-hydroxy - Vitamin B12   Patient was given the opportunity to ask questions.  Patient verbalized understanding of the plan and was able to repeat key elements of the plan. Patient was given clear instructions to go to Emergency Department or return to medical center if symptoms don't improve, worsen, or new problems develop.The patient verbalized understanding.   Orders Placed This Encounter  Procedures   Vitamin D, 25-hydroxy   Vitamin B12   Ambulatory referral to Ophthalmology   Ambulatory referral to Podiatry   POCT glycosylated hemoglobin (Hb A1C)    Return in about 3 months (around 05/01/2023) for Follow-Up or next available chronic care mgmt .  Rema Fendt, NP

## 2023-01-23 ENCOUNTER — Other Ambulatory Visit: Payer: Self-pay

## 2023-01-24 ENCOUNTER — Other Ambulatory Visit: Payer: Self-pay

## 2023-01-24 MED ORDER — ATORVASTATIN CALCIUM 20 MG PO TABS
20.0000 mg | ORAL_TABLET | Freq: Every day | ORAL | 0 refills | Status: DC
  Filled 2023-01-24 – 2023-04-01 (×3): qty 90, 90d supply, fill #0

## 2023-01-29 ENCOUNTER — Encounter: Payer: Self-pay | Admitting: Family

## 2023-01-29 ENCOUNTER — Ambulatory Visit (INDEPENDENT_AMBULATORY_CARE_PROVIDER_SITE_OTHER): Payer: Medicare Other | Admitting: Family

## 2023-01-29 VITALS — BP 124/78 | HR 55 | Temp 98.7°F | Resp 14 | Ht 67.0 in | Wt 205.2 lb

## 2023-01-29 DIAGNOSIS — Z7984 Long term (current) use of oral hypoglycemic drugs: Secondary | ICD-10-CM

## 2023-01-29 DIAGNOSIS — Z1321 Encounter for screening for nutritional disorder: Secondary | ICD-10-CM

## 2023-01-29 DIAGNOSIS — E119 Type 2 diabetes mellitus without complications: Secondary | ICD-10-CM

## 2023-01-29 DIAGNOSIS — E1165 Type 2 diabetes mellitus with hyperglycemia: Secondary | ICD-10-CM | POA: Diagnosis not present

## 2023-01-29 LAB — POCT GLYCOSYLATED HEMOGLOBIN (HGB A1C): HbA1c, POC (controlled diabetic range): 6.5 % (ref 0.0–7.0)

## 2023-01-29 NOTE — Progress Notes (Signed)
Pt is here for chronic care mgt  Pt states the jaurdiance is too expensive, requesting a medication change

## 2023-01-30 LAB — VITAMIN D 25 HYDROXY (VIT D DEFICIENCY, FRACTURES): Vit D, 25-Hydroxy: 61.3 ng/mL (ref 30.0–100.0)

## 2023-01-30 LAB — VITAMIN B12: Vitamin B-12: 1233 pg/mL (ref 232–1245)

## 2023-02-03 ENCOUNTER — Other Ambulatory Visit: Payer: Self-pay | Admitting: Family

## 2023-02-04 ENCOUNTER — Other Ambulatory Visit: Payer: Self-pay | Admitting: Family

## 2023-02-04 ENCOUNTER — Other Ambulatory Visit: Payer: Self-pay

## 2023-02-04 ENCOUNTER — Other Ambulatory Visit: Payer: Self-pay | Admitting: Cardiology

## 2023-02-04 MED ORDER — ENTRESTO 49-51 MG PO TABS: 1.0000 | ORAL_TABLET | Freq: Two times a day (BID) | ORAL | 2 refills | Status: DC

## 2023-02-04 NOTE — Telephone Encounter (Signed)
Patient  is  to continue Entresto 49/51 mg   90 day supply  X2  Medication sent to  [facility] as requested.

## 2023-02-04 NOTE — Telephone Encounter (Signed)
Call patient with update. Patient established with Cardiology for management of Entresto and related chronic conditions. Confirm with patient if Cardiology able to refill/continue management. Please let me know if I can further assist. Thank you.

## 2023-02-04 NOTE — Telephone Encounter (Signed)
*  STAT* If patient is at the pharmacy, call can be transferred to refill team.   1. Which medications need to be refilled? (please list name of each medication and dose if known) ENTRESTO 49-51 MG  2. Which pharmacy/location (including street and city if local pharmacy) is medication to be sent to?[facility] [address] [place], [place] - [address]  3. Do they need a 30 day or 90 day supply?  90 day supply

## 2023-02-04 NOTE — Telephone Encounter (Signed)
Pt calling requesting a refill on Entresto 49-51 mg. This medication has been refill by another provider. Would Dr. Herbie Baltimore like to refill this medication? Please address

## 2023-02-04 NOTE — Telephone Encounter (Signed)
Message was sent to pt to contact cardiologist in regards to refill

## 2023-02-05 ENCOUNTER — Other Ambulatory Visit: Payer: Self-pay

## 2023-02-05 NOTE — Telephone Encounter (Signed)
Please let me know if I can further assist

## 2023-02-06 ENCOUNTER — Other Ambulatory Visit: Payer: Self-pay | Admitting: Family

## 2023-02-06 NOTE — Telephone Encounter (Signed)
Call patient with update. Entresto prescribed on 02/04/2023 at 4:21 pm by Bryan Lemma, MD. Sherryll Burger prescription is for 180 tablets and 2 refills. Please confirm if patient is aware medication is available for pickup from pharmacy or if patient has already picked up prescription from pharmacy. Please notify me of patient's reply and if I can further assist. Thank you.

## 2023-02-11 ENCOUNTER — Ambulatory Visit (INDEPENDENT_AMBULATORY_CARE_PROVIDER_SITE_OTHER): Payer: Medicare Other | Admitting: Podiatry

## 2023-02-11 NOTE — Progress Notes (Signed)
Error

## 2023-02-17 ENCOUNTER — Telehealth: Payer: Self-pay

## 2023-02-17 NOTE — Telephone Encounter (Signed)
Copied from CRM [id number]. Topic: General - Other >> Feb 14, 2023  2:39 PM Santiya F wrote: Reason for CRM: [facility] is calling in requesting to speak with Arna Medici regarding a referral that was sent over for the pt. Pt has an appointment scheduled for June 20th.

## 2023-02-20 DIAGNOSIS — E119 Type 2 diabetes mellitus without complications: Secondary | ICD-10-CM | POA: Diagnosis not present

## 2023-02-20 DIAGNOSIS — H40053 Ocular hypertension, bilateral: Secondary | ICD-10-CM | POA: Diagnosis not present

## 2023-03-25 ENCOUNTER — Other Ambulatory Visit: Payer: Self-pay

## 2023-03-25 ENCOUNTER — Other Ambulatory Visit: Payer: Self-pay | Admitting: Family

## 2023-03-25 DIAGNOSIS — I10 Essential (primary) hypertension: Secondary | ICD-10-CM

## 2023-03-25 DIAGNOSIS — E1142 Type 2 diabetes mellitus with diabetic polyneuropathy: Secondary | ICD-10-CM

## 2023-03-25 DIAGNOSIS — I5022 Chronic systolic (congestive) heart failure: Secondary | ICD-10-CM

## 2023-03-25 MED ORDER — SPIRONOLACTONE 25 MG PO TABS
25.0000 mg | ORAL_TABLET | Freq: Every day | ORAL | 0 refills | Status: DC
Start: 2023-03-25 — End: 2023-07-04
  Filled 2023-03-25 – 2023-04-20 (×4): qty 90, 90d supply, fill #0

## 2023-03-25 MED ORDER — GLIPIZIDE ER 10 MG PO TB24
20.0000 mg | ORAL_TABLET | Freq: Every day | ORAL | 0 refills | Status: DC
Start: 2023-03-25 — End: 2023-05-01
  Filled 2023-03-25 – 2023-04-20 (×4): qty 180, 90d supply, fill #0

## 2023-03-25 MED ORDER — METFORMIN HCL 1000 MG PO TABS
1000.0000 mg | ORAL_TABLET | Freq: Two times a day (BID) | ORAL | 0 refills | Status: DC
Start: 2023-03-25 — End: 2023-05-01
  Filled 2023-03-25 – 2023-04-20 (×4): qty 180, 90d supply, fill #0

## 2023-03-26 ENCOUNTER — Encounter: Payer: Self-pay | Admitting: Cardiology

## 2023-03-26 ENCOUNTER — Other Ambulatory Visit: Payer: Self-pay

## 2023-03-26 NOTE — Telephone Encounter (Signed)
Error

## 2023-03-27 ENCOUNTER — Other Ambulatory Visit: Payer: Self-pay

## 2023-03-27 ENCOUNTER — Telehealth: Payer: Self-pay | Admitting: Cardiology

## 2023-03-27 NOTE — Telephone Encounter (Signed)
STAT if patient feels like he/she is going to faint   1. Are you feeling dizzy, lightheaded, or faint right now? Yes I have been feeling lightheaded and dizzy lately.     2. Have you passed out?  I have not passed out.     3. Do you have any other symptoms? N/A   4. Have you checked your HR and BP (record if available)?  My heart rate drops to about 40-42 beats per minute when resting or lying down, my blood pressure runs normal between 120-125/78-86.    Answered received via patient schedule. Please advise.

## 2023-03-27 NOTE — Telephone Encounter (Signed)
LVM to call office.

## 2023-03-27 NOTE — Telephone Encounter (Signed)
Patient states continued lightheaded and dizziness with low HR.  He states it is not worse than it was, but mentions "I would just be standing there and feel dizzy". He wonders the cause of this.  Noted it could be due to low HR and that the doctor is aware but will see if any new recommendations that can help with this.  Please advise

## 2023-03-27 NOTE — Telephone Encounter (Signed)
Covering for Dr. Herbie Baltimore I went ahead and added Dr. Nelly Laurence on this message since it seems there is a plan for ablation for frequent PVCs.   Do not want to change the dose of diltiazem without knowing more about the mechanism of the slow heart rate.   Can you please ask the patient which method he is using to record the heart rates of 40-42 bpm?   Would like him to use a device that actually records the electrical rhythm such as a smart watch with ECG capability or a Kardia device or auscultate his heart with a's at the scope.  When the rhythm is irregular such as it can be with frequent PVCs, using a mechanical means of checking the heart rate (finger on his pulse, automatic blood pressure cuff, pulse oximeter, etc.) we may be getting a significant "under-count" of his true heart rate. If the patient does have an electrical device such as a smart watch or Kardia, please have them send a sample of his rhythm when the heartbeat is slow, via MyChart.

## 2023-03-28 NOTE — Telephone Encounter (Signed)
Spoke with patient regarding all information. He is taking the pulse via BP machine so unsure of the accuracy.  He will get a Kardia mobile device and check with this and then send readings once obtained.  He will call if further issues prior to these tracings.

## 2023-04-01 ENCOUNTER — Other Ambulatory Visit: Payer: Self-pay

## 2023-04-02 ENCOUNTER — Other Ambulatory Visit: Payer: Self-pay

## 2023-04-02 ENCOUNTER — Other Ambulatory Visit: Payer: Self-pay | Admitting: Urology

## 2023-04-02 ENCOUNTER — Encounter: Payer: Self-pay | Admitting: Urology

## 2023-04-02 ENCOUNTER — Ambulatory Visit: Payer: Medicare Other | Admitting: Urology

## 2023-04-02 VITALS — BP 147/85 | HR 44 | Ht 67.0 in | Wt 199.0 lb

## 2023-04-02 DIAGNOSIS — N529 Male erectile dysfunction, unspecified: Secondary | ICD-10-CM | POA: Diagnosis not present

## 2023-04-02 LAB — URINALYSIS, ROUTINE W REFLEX MICROSCOPIC
Bilirubin, UA: NEGATIVE
Glucose, UA: NEGATIVE
Ketones, UA: NEGATIVE
Leukocytes,UA: NEGATIVE
Nitrite, UA: NEGATIVE
RBC, UA: NEGATIVE
Specific Gravity, UA: >1.03 — ABNORMAL HIGH (ref 1.005–1.030)
Urobilinogen, Ur: 0.2 mg/dL (ref 0.2–1.0)
pH, UA: 5.5 (ref 5.0–7.5)

## 2023-04-02 LAB — MICROSCOPIC EXAMINATION
Bacteria, UA: NONE SEEN
RBC, Urine: NONE SEEN /hpf (ref 0–2)

## 2023-04-02 MED ORDER — SILDENAFIL CITRATE 100 MG PO TABS
100.0000 mg | ORAL_TABLET | Freq: Every day | ORAL | 11 refills | Status: DC | PRN
Start: 2023-04-02 — End: 2023-04-02
  Filled 2023-04-02: qty 8, 60d supply, fill #0

## 2023-04-02 MED ORDER — SILDENAFIL CITRATE 100 MG PO TABS
100.0000 mg | ORAL_TABLET | Freq: Every day | ORAL | 11 refills | Status: DC | PRN
Start: 2023-04-02 — End: 2023-12-09

## 2023-04-02 NOTE — Progress Notes (Signed)
Assessment: 1. Organic impotence     Plan: Trial of sildenafil 100 mg prn. Rx sent. Will call with results I will discussed additional options for management of his erectile dysfunction including vacuum erection device, penile injections, intraurethral suppositories, and penile prosthesis. Information on penile injection procedure provided   Chief Complaint:  Chief Complaint  Patient presents with   Erectile Dysfunction    History of Present Illness:  Todd Greer is a 67 y.o. male who is seen for continued evaluation of erectile dysfunction. He reported gradual onset of erectile dysfunction for approximately 2 years.  He has been able to achieve only a partial erection with 30 to 40% rigidity and difficulty maintaining his erections as well.  He has been unable to engage in intercourse due to the poor rigidity.  No pain or curvature with erections.  No nocturnal or early morning erections.  No decrease in his libido.  No prior treatment.  He has urinary symptoms including frequency, urgency, nocturia, intermittent stream, and hesitancy.  The symptoms have been present for 5-6 years.  No dysuria or gross hematuria. IPSS = 16.  He was given a trial of tadalafil 5 mg daily at his visit in March 2024.  He had not seen a significant improvement in his erectile dysfunction with the daily tadalafil.  He continued to have partial erections which are not adequate for intercourse.  He also noted rapid detumescence.  No pain or curvature with erections.  He reported better results with the tadalafil 20 mg as needed.  He would like to try this again. His lower urinary tract symptoms were unchanged.  He continued to have frequency urgency, and sensation of incomplete emptying.  No dysuria or gross hematuria. IPSS = 20.  PSA 5/24: 1.3  He today for follow-up.  He did not see any improvement in his erectile dysfunction with the tadalafil 20 mg as needed.  His lower urinary tract symptoms  remain stable.  He continues with some decreased stream and urgency.  No dysuria or gross hematuria. IPSS = 23 today.  Portions of the above documentation were copied from a prior visit for review purposes only.  Past Medical History:  Past Medical History:  Diagnosis Date   Anxiety    Chronic combined systolic and diastolic congestive heart failure, NYHA class 2 (HCC) 09/2017   Has been maintained on excellent medications: Carvedilol 12.5 mg BID, Entresto 49 -51 mg p.o. BID, empagliflozin 10 mg daily, spironolactone 25 mg daily.   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  Mild RV dilation.  Mild MR.  Catheterization showed EF 25% normal EDP and normal coronaries (has been followed by Dr. Sharyn Lull)   Obesity, Class II, BMI 35-39.9, with comorbidity    Obesity, diabetes, and hypertension syndrome (HCC)    Substance abuse (HCC)     Past Surgical History:  Past Surgical History:  Procedure Laterality Date   LEFT HEART CATH AND CORONARY ANGIOGRAPHY N/A 09/09/2017   Procedure: LEFT HEART CATH AND CORONARY ANGIOGRAPHY;  Surgeon: Rinaldo Cloud, MD;  Location: [facility];  Service: Cardiovascular; EF 15 to 20%.  Diffuse HK.  Normal LVEDP.  Normal coronaries.   NM  EXERCISE MYOVIEW LTD  06/02/2013   Good exercise capacity.  Hypertensive response to exercise.  Dyspneic  on exercise but no significant ST changes to suggest ischemia.  Normal stress images.  No ischemia or infarction   TRANSTHORACIC ECHOCARDIOGRAM  09/06/2017   EF 15 to 20%.  Diffuse HK.  Severe LA dilation.  Mild RV dilation.  Mild MR.    Allergies:  Allergies  Allergen Reactions   Ace Inhibitors Cough    Tolerates ARB   Amlodipine Other (See Comments)    Fatigue   Lisinopril     Family History:  Family History   Problem Relation Age of Onset   Stroke Father    Diabetes Father    Hypertension Father    Cancer Sister    Cerebral aneurysm Mother        Died at a young age.   Diabetes Paternal Grandmother    Hypertension Paternal Grandmother    Stroke Paternal Grandmother     Social History:  Social History   Tobacco Use   Smoking status: Former    Current packs/day: 0.00    Average packs/day: 0.5 packs/day for 10.0 years (5.0 ttl pk-yrs)    Types: Cigarettes    Start date: 10/03/1973    Quit date: 10/04/1983    Years since quitting: 39.5    Passive exposure: Past   Smokeless tobacco: Never  Vaping Use   Vaping status: Never Used  Substance Use Topics   Alcohol use: Yes    Alcohol/week: 6.0 standard drinks of alcohol    Types: 6 Standard drinks or equivalent per week   Drug use: No    ROS: Constitutional:  Negative for fever, chills, weight loss CV: Negative for chest pain, previous MI, hypertension Respiratory:  Negative for shortness of breath, wheezing, sleep apnea, frequent cough GI:  Negative for nausea, vomiting, bloody stool, GERD  Physical exam: BP (!) 147/85   Pulse (!) 44   Ht 5\' 7"  (1.702 m)   Wt 199 lb (90.3 kg)   BMI 31.17 kg/m  GENERAL APPEARANCE:  Well appearing, well developed, well nourished, NAD HEENT:  Atraumatic, normocephalic, oropharynx clear NECK:  Supple without lymphadenopathy or thyromegaly ABDOMEN:  Soft, non-tender, no masses EXTREMITIES:  Moves all extremities well, without clubbing, cyanosis, or edema NEUROLOGIC:  Alert and oriented x 3, normal gait, CN II-XII grossly intact MENTAL STATUS:  appropriate BACK:  Non-tender to palpation, No CVAT SKIN:  Warm, dry, and intact   Results: U/A:  0-5 WBC, 0 RBC

## 2023-04-02 NOTE — Addendum Note (Signed)
Addended by: Lizbeth Bark on: 04/02/2023 11:56 AM   Modules accepted: Orders

## 2023-04-18 ENCOUNTER — Other Ambulatory Visit: Payer: Self-pay

## 2023-04-21 ENCOUNTER — Other Ambulatory Visit: Payer: Self-pay

## 2023-05-01 ENCOUNTER — Ambulatory Visit (INDEPENDENT_AMBULATORY_CARE_PROVIDER_SITE_OTHER): Payer: Medicare Other | Admitting: Family

## 2023-05-01 ENCOUNTER — Other Ambulatory Visit: Payer: Self-pay

## 2023-05-01 VITALS — BP 109/74 | HR 85 | Temp 98.8°F | Ht 67.0 in | Wt 200.0 lb

## 2023-05-01 DIAGNOSIS — Z7984 Long term (current) use of oral hypoglycemic drugs: Secondary | ICD-10-CM | POA: Diagnosis not present

## 2023-05-01 DIAGNOSIS — E1165 Type 2 diabetes mellitus with hyperglycemia: Secondary | ICD-10-CM | POA: Diagnosis not present

## 2023-05-01 LAB — POCT GLYCOSYLATED HEMOGLOBIN (HGB A1C): HbA1c, POC (controlled diabetic range): 6.5 % (ref 0.0–7.0)

## 2023-05-01 MED ORDER — ONETOUCH ULTRA VI STRP
ORAL_STRIP | 12 refills | Status: DC
  Filled 2023-05-01: qty 100, 25d supply, fill #0
  Filled 2023-06-09: qty 100, 25d supply, fill #1
  Filled 2023-07-04 (×2): qty 100, 25d supply, fill #2
  Filled 2023-10-08: qty 100, 25d supply, fill #3
  Filled 2023-11-04: qty 100, 25d supply, fill #4
  Filled 2023-11-27: qty 100, 25d supply, fill #5
  Filled 2023-12-22: qty 100, 25d supply, fill #6
  Filled 2024-01-05 – 2024-01-20 (×2): qty 100, 25d supply, fill #7
  Filled 2024-03-02: qty 100, 25d supply, fill #8
  Filled 2024-04-05 – 2024-04-21 (×3): qty 100, 25d supply, fill #9

## 2023-05-01 MED ORDER — GLIPIZIDE ER 10 MG PO TB24
20.0000 mg | ORAL_TABLET | Freq: Every day | ORAL | 0 refills | Status: DC
Start: 2023-05-01 — End: 2023-08-04
  Filled 2023-05-01 – 2023-07-04 (×2): qty 180, 90d supply, fill #0

## 2023-05-01 MED ORDER — METFORMIN HCL 1000 MG PO TABS
1000.0000 mg | ORAL_TABLET | Freq: Two times a day (BID) | ORAL | 0 refills | Status: DC
Start: 2023-05-01 — End: 2023-08-04
  Filled 2023-05-01 – 2023-07-04 (×2): qty 180, 90d supply, fill #0

## 2023-05-01 NOTE — Progress Notes (Signed)
Pt states nothing in specific to talk about, states his original prescription for monitoring blood was checl 4x a day, and he said he only does it twice a day and 4x was to much.

## 2023-05-01 NOTE — Progress Notes (Signed)
Patient ID: Todd Greer, male    DOB: 03/28/1956  MRN: [id number]  CC: Chronic Conditions Follow-Up  Subjective: Todd Greer is a 67 y.o. male who presents for chronic conditions follow-up.  His concerns today include:  Doing well on Metformin and Glipizide, no issues/concerns. Home blood sugars 70's - 100's. Needs test strips refilled. He denies red flag symptoms associated with diabetes.   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     atorvastatin (LIPITOR) 20 MG tablet Take 1 tablet (20 mg total) by mouth daily. 90 tablet 0   Blood Glucose Monitoring Suppl (ONE TOUCH ULTRA 2) w/Device KIT 1 Device by Does not apply route 4 (four) times daily -  before meals and at bedtime. 1 kit 0   diltiazem (CARDIZEM CD) 240 MG 24 hr capsule Take 1 capsule (240 mg total) by mouth daily. 90 capsule 3   ENTRESTO 49-51 MG Take 1  tablet by mouth 2 (two) times daily. 180 tablet 2   Magnesium 400 MG CAPS Take 400 mg by mouth 2 (two) times daily. 90 capsule 3   Multiple Vitamin (ONE-A-DAY MENS PO) Take 1 tablet by mouth daily.     OneTouch Delica Lancets 33G MISC Check blood sugars 4 times a day before and after meals 100 each 12   sildenafil (VIAGRA) 100 MG tablet Take 1 tablet (100 mg total) by mouth daily as needed for erectile dysfunction. 8 tablet 11   spironolactone (ALDACTONE) 25 MG tablet Take 1 tablet (25 mg total) by mouth daily. 90 tablet 0   No current facility-administered medications on file prior to visit.    Allergies  Allergen Reactions   Ace Inhibitors Cough    Tolerates ARB   Amlodipine Other (See Comments)    Fatigue   Lisinopril     Social History   Socioeconomic History   Marital status: Married    Spouse name: Not on file   Number of children: Not on file   Years of education: Not on file   Highest education level: Not on file  Occupational History   Not on file  Tobacco Use   Smoking status: Former    Current packs/day: 0.00    Average packs/day: 0.5 packs/day for 10.0 years (5.0 ttl pk-yrs)    Types: Cigarettes    Start date: 10/03/1973    Quit date: 10/04/1983    Years since quitting: 39.6    Passive exposure: Past  Smokeless tobacco: Never  Vaping Use   Vaping status: Never Used  Substance and Sexual Activity   Alcohol use: Yes    Alcohol/week: 6.0 standard drinks of alcohol    Types: 6 Standard drinks or equivalent per week   Drug use: No   Sexual activity: Yes  Other Topics Concern   Not on file  Social History Narrative   His married father of 4.  His married for 28 years.  He lives with his wife and daughters.  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  Education: [employer].   He quit smoking in 1985.  He drinks 3 beers a week.   Prior to the onset of his current symptoms, used to work out with weights for at least 40  minutes a day for 3 times a week.  He usually did light weights for many occasions as opposed to heavy weights.      Contacts: Wife [name]; daughter [name]   Social Determinants of Health   Financial Resource Strain: Not on file  Food Insecurity: Not on file  Transportation Needs: Not on file  Physical Activity: Not on file  Stress: Not on file  Social Connections: Not on file  Intimate Partner Violence: Not on file    Family History  Problem Relation Age of Onset   Stroke Father    Diabetes Father    Hypertension Father    Cancer Sister    Cerebral aneurysm Mother        Died at a young age.   Diabetes Paternal Grandmother    Hypertension Paternal Grandmother    Stroke Paternal Grandmother     Past Surgical History:  Procedure Laterality Date   LEFT HEART CATH AND CORONARY ANGIOGRAPHY N/A 09/09/2017   Procedure: LEFT HEART CATH AND CORONARY ANGIOGRAPHY;  Surgeon: Rinaldo Cloud, MD;  Location: [facility];  Service: Cardiovascular; EF 15 to 20%.  Diffuse HK.  Normal LVEDP.  Normal coronaries.   NM  EXERCISE MYOVIEW LTD  06/02/2013   Good exercise capacity.  Hypertensive response to exercise.  Dyspneic on exercise but no significant ST changes to suggest ischemia.  Normal stress images.  No ischemia or infarction   TRANSTHORACIC ECHOCARDIOGRAM  09/06/2017   EF 15 to 20%.  Diffuse HK.  Severe LA dilation.  Mild RV dilation.  Mild MR.    ROS: Review of Systems Negative except as stated above  PHYSICAL EXAM: BP 109/74   Pulse 85   Temp 98.8 F (37.1 C) (Oral)   Ht 5\' 7"  (1.702 m)   Wt 200 lb (90.7 kg)   SpO2 94%   BMI 31.32 kg/m   Physical Exam HENT:     Head: Normocephalic and atraumatic.     Nose: Nose normal.     Mouth/Throat:     Mouth: Mucous membranes are moist.     Pharynx: Oropharynx is clear.  Eyes:     Extraocular Movements: Extraocular movements intact.     Conjunctiva/sclera: Conjunctivae normal.     Pupils: Pupils are equal,  round, and reactive to light.  Cardiovascular:     Rate and Rhythm: Normal rate and regular rhythm.     Pulses: Normal pulses.     Heart sounds: Normal heart sounds.  Pulmonary:     Effort: Pulmonary effort is normal.     Breath sounds: Normal breath sounds.  Musculoskeletal:        General: Normal range of motion.  Cervical back: Normal range of motion and neck supple.  Neurological:     General: No focal deficit present.     Mental Status: He is alert and oriented to person, place, and time.  Psychiatric:        Mood and Affect: Mood normal.        Behavior: Behavior normal.    Results for orders placed or performed in visit on 05/01/23  POCT glycosylated hemoglobin (Hb A1C)  Result Value Ref Range   Hemoglobin A1C     HbA1c POC (<> result, manual entry)     HbA1c, POC (prediabetic range)     HbA1c, POC (controlled diabetic range) 6.5 0.0 - 7.0 %    ASSESSMENT AND PLAN: 1. Type 2 diabetes mellitus with hyperglycemia, without long-term current use of insulin (HCC) - Hemoglobin A1c at goal at 6.5%, goal 7%.  - Continue Metformin and Glipizide as prescribed.  - Glucose blood test strips refilled.  - Routine screening.  - Discussed the importance of healthy eating habits, low-carbohydrate diet, low-sugar diet, regular aerobic exercise (at least 150 minutes a week as tolerated) and medication compliance to achieve or maintain control of diabetes. - Follow-up with primary provider in 3 months or sooner if needed.  - POCT glycosylated hemoglobin (Hb A1C) - Basic Metabolic Panel - glipiZIDE (GLIPIZIDE XL) 10 MG 24 hr tablet; Take 2 tablets (20 mg total) by mouth daily.  Dispense: 180 tablet; Refill: 0 - metFORMIN (GLUCOPHAGE) 1000 MG tablet; Take 1 tablet (1,000 mg total) by mouth 2 (two) times daily with a meal.  Dispense: 180 tablet; Refill: 0 - glucose blood (ONETOUCH ULTRA) test strip; Check blood sugars 4 times a day before and after meals  Dispense: 100 each; Refill:  12   Patient was given the opportunity to ask questions.  Patient verbalized understanding of the plan and was able to repeat key elements of the plan. Patient was given clear instructions to go to Emergency Department or return to medical center if symptoms don't improve, worsen, or new problems develop.The patient verbalized understanding.   Orders Placed This Encounter  Procedures   Basic Metabolic Panel   POCT glycosylated hemoglobin (Hb A1C)     Requested Prescriptions   Signed Prescriptions Disp Refills   glipiZIDE (GLIPIZIDE XL) 10 MG 24 hr tablet 180 tablet 0    Sig: Take 2 tablets (20 mg total) by mouth daily.   metFORMIN (GLUCOPHAGE) 1000 MG tablet 180 tablet 0    Sig: Take 1 tablet (1,000 mg total) by mouth 2 (two) times daily with a meal.   glucose blood (ONETOUCH ULTRA) test strip 100 each 12    Sig: Check blood sugars 4 times a day before and after meals    Return in about 3 months (around 08/01/2023) for Follow-Up or next available chronic conditions .  Rema Fendt, NP

## 2023-05-02 ENCOUNTER — Other Ambulatory Visit: Payer: Self-pay

## 2023-05-02 LAB — BASIC METABOLIC PANEL
BUN/Creatinine Ratio: 16 (ref 10–24)
BUN: 20 mg/dL (ref 8–27)
CO2: 19 mmol/L — ABNORMAL LOW (ref 20–29)
Calcium: 8.9 mg/dL (ref 8.6–10.2)
Chloride: 100 mmol/L (ref 96–106)
Creatinine, Ser: 1.25 mg/dL (ref 0.76–1.27)
Glucose: 151 mg/dL — ABNORMAL HIGH (ref 70–99)
Potassium: 4.2 mmol/L (ref 3.5–5.2)
Sodium: 135 mmol/L (ref 134–144)
eGFR: 64 mL/min/{1.73_m2} (ref >59)

## 2023-06-09 ENCOUNTER — Other Ambulatory Visit: Payer: Self-pay
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
## 2023-06-13 ENCOUNTER — Other Ambulatory Visit: Payer: Self-pay

## 2023-06-17 ENCOUNTER — Ambulatory Visit: Payer: Medicare Other | Admitting: Cardiovascular Disease

## 2023-06-30 ENCOUNTER — Encounter: Payer: Self-pay | Admitting: Cardiovascular Disease

## 2023-06-30 ENCOUNTER — Ambulatory Visit: Payer: Medicare Other | Attending: Cardiovascular Disease | Admitting: Cardiovascular Disease

## 2023-06-30 VITALS — BP 114/68 | HR 74 | Ht 67.0 in | Wt 177.0 lb

## 2023-06-30 DIAGNOSIS — R001 Bradycardia, unspecified: Secondary | ICD-10-CM

## 2023-06-30 DIAGNOSIS — I5032 Chronic diastolic (congestive) heart failure: Secondary | ICD-10-CM | POA: Diagnosis not present

## 2023-06-30 DIAGNOSIS — R008 Other abnormalities of heart beat: Secondary | ICD-10-CM

## 2023-06-30 DIAGNOSIS — I493 Ventricular premature depolarization: Secondary | ICD-10-CM | POA: Diagnosis not present

## 2023-06-30 DIAGNOSIS — I428 Other cardiomyopathies: Secondary | ICD-10-CM | POA: Diagnosis not present

## 2023-06-30 NOTE — Patient Instructions (Signed)
Medication Instructions:  Your physician recommends that you continue on your current medications as directed. Please refer to the Current Medication list given to you today. *If you need a refill on your cardiac medications before your next appointment, please call your pharmacy*   Testing/Procedures: Postpone ablation until updated echo can be obtained  Echocardiogram Your physician has requested that you have an echocardiogram. Echocardiography is a painless test that uses sound waves to create images of your heart. It provides your doctor with information about the size and shape of your heart and how well your heart's chambers and valves are working. This procedure takes approximately one hour. There are no restrictions for this procedure. Please do NOT wear cologne, perfume, aftershave, or lotions (deodorant is allowed). Please arrive 15 minutes prior to your appointment time.   Follow-Up: At [facility], you and your health needs are our priority.  As part of our continuing mission to provide you with exceptional heart care, we have created designated Provider Care Teams.  These Care Teams include your primary Cardiologist (physician) and Advanced Practice Providers (APPs -  Physician Assistants and Nurse Practitioners) who all work together to provide you with the care you need, when you need it.  We recommend signing up for the patient portal called "MyChart".  Sign up information is provided on this After Visit Summary.  MyChart is used to connect with patients for Virtual Visits (Telemedicine).  Patients are able to view lab/test results, encounter notes, upcoming appointments, etc.  Non-urgent messages can be sent to your provider as well.   To learn more about what you can do with MyChart, go to [email or web address].    Your next appointment:   6 month(s)  Provider:   York Pellant, MD

## 2023-06-30 NOTE — Progress Notes (Signed)
  Electrophysiology Office Note:    Date:  06/30/2023   ID:  Todd Greer, DOB 1956-07-04, MRN [id number]  PCP:  Rema Fendt, NP   [facility] Providers Cardiologist:  Bryan Lemma, MD Electrophysiologist:  Maurice Small, MD     Referring MD: Rema Fendt, NP   History of Present Illness:    Todd Greer is a 67 y.o. male with a hx listed below, significant for CHF with recovered EF, hepatitis C, frequent PVCs referred for arrhythmia management.  January 2019, he had heart failure with severely reduced EF (20%).  His LV function recovered with GDMT.  He noticed decreased pulse rates in November 2023.  Specifically, heart rates were at lowest 30s at nighttime.  Zio patch was placed in January that showed 7% PVC burden.  01/10/2023  At his last visit, we stopped his beta-blocker and started diltiazem and magnesium.  He reports that he no longer has muscle soreness with the diltiazem and magnesium.  His fatigue is somewhat improved but not resolved.    EKGs/Labs/Other Studies Reviewed Today:    Echocardiogram:  TTE 09/10/2022   EF 60-65%, moderate LVH   Monitors:  09/2022 Zio monitor 27% PVCs, essentially monomorphic  Stress testing:  2014 - negative myoview    Advanced imaging:    EKG:  Last EKG results: today - sinus rhythm. PVCs in trigeminy. Single PVC morphology. PAC   Recent Labs: 05/01/2023: BUN 20; Creatinine, Ser 1.25; Potassium 4.2; Sodium 135     Physical Exam:    VS:  BP 114/68 (BP Location: Left Arm, Patient Position: Sitting, Cuff Size: Large)   Pulse 74   Ht 5\' 7"  (1.702 m)   Wt 177 lb (80.3 kg)   SpO2 96%   BMI 27.72 kg/m     Wt Readings from Last 3 Encounters:  06/30/23 177 lb (80.3 kg)  05/01/23 200 lb (90.7 kg)  04/02/23 199 lb (90.3 kg)     GEN: Well nourished, well developed in no acute distress CARDIAC: RRR, no murmurs, rubs, gallops RESPIRATORY:  Normal work of breathing MUSCULOSKELETAL: no  edema    ASSESSMENT & PLAN:    Frequent PVCs Appear to have been present no later than 2014 Occasionally in bigeminy Today, he reports that he is doing great -- fatigue he had previously resolved with discontinuation of betablocker Two morphologies on ECG  Dominant PVC during prior visit: - V1,V2, abrupt transition V3. Superiorly-directed axis (-III, aVF; +II); ++I, aVL; -aVR; Today, his PVC is + in V1-V6, + II, III, aVF Continue magnesium, diltiazem 240 mg Ablation may be difficult -- he has had multiple morphologies. I am going to recheck limited TTE for EF. If EF remains normal, will cancel the ablation given low benefit given absence of symptoms and increased risk of failure with multiple morphologies    Heart failure with recovered EF EF 15-20%  in 2019, normalized on TTE 09/2022 Etiology -- HTN? EtOH/substance abuse? PVCs? -- PVCs seem unlikely since he continued to have PVCs after EF normalized.      Medication Adjustments/Labs and Tests Ordered: Current medicines are reviewed at length with the patient today.  Concerns regarding medicines are outlined above.  Orders Placed This Encounter  Procedures   EKG 12-Lead   No orders of the defined types were placed in this encounter.    Signed, Maurice Small, MD  06/30/2023 2:54 PM    [facility]

## 2023-07-03 ENCOUNTER — Other Ambulatory Visit (HOSPITAL_COMMUNITY): Payer: Self-pay

## 2023-07-04 ENCOUNTER — Other Ambulatory Visit: Payer: Self-pay

## 2023-07-04 ENCOUNTER — Other Ambulatory Visit: Payer: Self-pay | Admitting: Family

## 2023-07-04 DIAGNOSIS — I5022 Chronic systolic (congestive) heart failure: Secondary | ICD-10-CM

## 2023-07-04 DIAGNOSIS — I10 Essential (primary) hypertension: Secondary | ICD-10-CM

## 2023-07-04 MED ORDER — SPIRONOLACTONE 25 MG PO TABS
25.0000 mg | ORAL_TABLET | Freq: Every day | ORAL | 0 refills | Status: DC
Start: 2023-07-04 — End: 2023-10-09
  Filled 2023-07-04: qty 90, 90d supply, fill #0

## 2023-07-04 NOTE — Telephone Encounter (Signed)
Requested medication (s) are due for refill today: yes  Requested medication (s) are on the active medication list: yes  Last refill:  01/24/23 #90/0  Future visit scheduled: yes  Notes to clinic:  Unable to refill per protocol due to failed labs, no updated results.    Requested Prescriptions  Pending Prescriptions Disp Refills   atorvastatin (LIPITOR) 20 MG tablet 90 tablet 0    Sig: Take 1 tablet (20 mg total) by mouth daily.     Cardiovascular:  Antilipid - Statins Failed - 07/04/2023  4:55 PM      Failed - Lipid Panel in normal range within the last 12 months    Cholesterol, Total  Date Value Ref Range Status  10/25/2020 141 100 - 199 mg/dL Final   LDL Chol Calc (NIH)  Date Value Ref Range Status  10/25/2020 73 0 - 99 mg/dL Final   HDL  Date Value Ref Range Status  10/25/2020 53 >39 mg/dL Final   Triglycerides  Date Value Ref Range Status  10/25/2020 76 0 - 149 mg/dL Final         Passed - Patient is not pregnant      Passed - Valid encounter within last 12 months    Recent Outpatient Visits           2 months ago Type 2 diabetes mellitus with hyperglycemia, without long-term current use of insulin (HCC)   [place] [facility], Amy J, NP   5 months ago Type 2 diabetes mellitus with hyperglycemia, without long-term current use of insulin (HCC)   [place] [facility], [place], NP   8 months ago Encounter for [employer] annual wellness exam   [facility], Amy J, NP   11 months ago Type 2 diabetes mellitus with diabetic polyneuropathy, without long-term current use of insulin (HCC)   [place] [facility], Amy J, NP   1 year ago Type 2 diabetes mellitus with diabetic polyneuropathy, without long-term current use of insulin (HCC)   [place] [facility], [place], NP       Future Appointments             In  1 month Rema Fendt, NP [facility]   In 4 months Mealor, Roberts Gaudy, MD [facility]            Signed Prescriptions Disp Refills   spironolactone (ALDACTONE) 25 MG tablet 90 tablet 0    Sig: Take 1 tablet (25 mg total) by mouth daily.     Cardiovascular: Diuretics - Aldosterone Antagonist Passed - 07/04/2023  4:55 PM      Passed - Cr in normal range and within 180 days    Creat  Date Value Ref Range Status  06/03/2014 0.86 0.50 - 1.35 mg/dL Final   Creatinine, Ser  Date Value Ref Range Status  05/01/2023 1.25 0.76 - 1.27 mg/dL Final         Passed - K in normal range and within 180 days    Potassium  Date Value Ref Range Status  05/01/2023 4.2 3.5 - 5.2 mmol/L Final         Passed - Na in normal range and within 180 days    Sodium  Date Value Ref Range Status  05/01/2023 135 134 - 144  mmol/L Final         Passed - eGFR is 30 or above and within 180 days    GFR, Est African American  Date Value Ref Range Status  06/03/2014 >89 mL/min Final   GFR calc Af Amer  Date Value Ref Range Status  10/25/2020 77 >59 mL/min/1.73 Final    Comment:    **In accordance with recommendations from the NKF-ASN Task force,**   [facility] is in the process of updating its eGFR calculation to the   2021 CKD-EPI creatinine equation that estimates kidney function   without a race variable.    GFR, Est Non African American  Date Value Ref Range Status  06/03/2014 >89 mL/min Final    Comment:      The estimated GFR is a calculation valid for adults (>=74 years old) that uses the CKD-EPI algorithm to adjust for age and sex. It is   not to be used for children, pregnant women, hospitalized patients,    patients on dialysis, or with rapidly changing kidney function. According to the NKDEP, eGFR >89 is normal, 60-89 shows mild impairment, 30-59 shows moderate impairment, 15-29 shows severe impairment and <15 is  ESRD.     GFR, Estimated  Date Value Ref Range Status  07/06/2021 >60 >60 mL/min Final    Comment:    (NOTE) Calculated using the CKD-EPI Creatinine Equation (2021)    eGFR  Date Value Ref Range Status  05/01/2023 64 >59 mL/min/1.73 Final         Passed - Last BP in normal range    BP Readings from Last 1 Encounters:  06/30/23 114/68         Passed - Valid encounter within last 6 months    Recent Outpatient Visits           2 months ago Type 2 diabetes mellitus with hyperglycemia, without long-term current use of insulin (HCC)   [place] [facility], Amy J, NP   5 months ago Type 2 diabetes mellitus with hyperglycemia, without long-term current use of insulin (HCC)   [place] [facility], [place], NP   8 months ago Encounter for [employer] annual wellness exam   [facility] at [facility], Amy J, NP   11 months ago Type 2 diabetes mellitus with diabetic polyneuropathy, without long-term current use of insulin (HCC)   [place] [facility] at [facility], Amy J, NP   1 year ago Type 2 diabetes mellitus with diabetic polyneuropathy, without long-term current use of insulin [facility])   [place] [facility] at [facility], [place], NP       Future Appointments             In 1 month Rema Fendt, NP [facility] at [facility]   In 4 months Mealor, Roberts Gaudy, MD [facility] at [facility]

## 2023-07-04 NOTE — Telephone Encounter (Signed)
Requested Prescriptions  Pending Prescriptions Disp Refills   atorvastatin (LIPITOR) 20 MG tablet 90 tablet 0    Sig: Take 1 tablet (20 mg total) by mouth daily.     Cardiovascular:  Antilipid - Statins Failed - 07/04/2023  4:55 PM      Failed - Lipid Panel in normal range within the last 12 months    Cholesterol, Total  Date Value Ref Range Status  10/25/2020 141 100 - 199 mg/dL Final   LDL Chol Calc (NIH)  Date Value Ref Range Status  10/25/2020 73 0 - 99 mg/dL Final   HDL  Date Value Ref Range Status  10/25/2020 53 >39 mg/dL Final   Triglycerides  Date Value Ref Range Status  10/25/2020 76 0 - 149 mg/dL Final         Passed - Patient is not pregnant      Passed - Valid encounter within last 12 months    Recent Outpatient Visits           2 months ago Type 2 diabetes mellitus with hyperglycemia, without long-term current use of insulin (HCC)   [place] [facility], Amy J, NP   5 months ago Type 2 diabetes mellitus with hyperglycemia, without long-term current use of insulin (HCC)   [place] [facility], [place], NP   8 months ago Encounter for [employer] annual wellness exam   [facility] at [facility], Amy J, NP   11 months ago Type 2 diabetes mellitus with diabetic polyneuropathy, without long-term current use of insulin (HCC)   [place] [facility], Amy J, NP   1 year ago Type 2 diabetes mellitus with diabetic polyneuropathy, without long-term current use of insulin (HCC)   [place] [facility] at [facility], [place], NP       Future Appointments             In 1 month Rema Fendt, NP  Primary Care at [facility]   In 4 months Mealor, Roberts Gaudy, MD [facility] at [facility]             spironolactone (ALDACTONE) 25 MG tablet 90 tablet 0    Sig: Take 1 tablet (25 mg total) by mouth  daily.     Cardiovascular: Diuretics - Aldosterone Antagonist Passed - 07/04/2023  4:55 PM      Passed - Cr in normal range and within 180 days    Creat  Date Value Ref Range Status  06/03/2014 0.86 0.50 - 1.35 mg/dL Final   Creatinine, Ser  Date Value Ref Range Status  05/01/2023 1.25 0.76 - 1.27 mg/dL Final         Passed - K in normal range and within 180 days    Potassium  Date Value Ref Range Status  05/01/2023 4.2 3.5 - 5.2 mmol/L Final         Passed - Na in normal range and within 180 days    Sodium  Date Value Ref Range Status  05/01/2023 135 134 - 144 mmol/L Final         Passed - eGFR is 30 or above and within 180 days    GFR, Est African American  Date Value Ref Range Status  06/03/2014 >89 mL/min Final   GFR calc Af Amer  Date Value Ref Range Status  10/25/2020 77 >59 mL/min/1.73  Final    Comment:    **In accordance with recommendations from the NKF-ASN Task force,**   [facility] is in the process of updating its eGFR calculation to the   2021 CKD-EPI creatinine equation that estimates kidney function   without a race variable.    GFR, Est Non African American  Date Value Ref Range Status  06/03/2014 >89 mL/min Final    Comment:      The estimated GFR is a calculation valid for adults (>=70 years old) that uses the CKD-EPI algorithm to adjust for age and sex. It is   not to be used for children, pregnant women, hospitalized patients,    patients on dialysis, or with rapidly changing kidney function. According to the NKDEP, eGFR >89 is normal, 60-89 shows mild impairment, 30-59 shows moderate impairment, 15-29 shows severe impairment and <15 is ESRD.     GFR, Estimated  Date Value Ref Range Status  07/06/2021 >60 >60 mL/min Final    Comment:    (NOTE) Calculated using the CKD-EPI Creatinine Equation (2021)    eGFR  Date Value Ref Range Status  05/01/2023 64 >59 mL/min/1.73 Final         Passed - Last BP in normal range    BP Readings from  Last 1 Encounters:  06/30/23 114/68         Passed - Valid encounter within last 6 months    Recent Outpatient Visits           2 months ago Type 2 diabetes mellitus with hyperglycemia, without long-term current use of insulin (HCC)   [place] [facility], Amy J, NP   5 months ago Type 2 diabetes mellitus with hyperglycemia, without long-term current use of insulin (HCC)   [place] [facility] at [facility], [place], NP   8 months ago Encounter for [employer] annual wellness exam   [facility], Amy J, NP   11 months ago Type 2 diabetes mellitus with diabetic polyneuropathy, without long-term current use of insulin (HCC)   [place] [facility] at [facility], Amy J, NP   1 year ago Type 2 diabetes mellitus with diabetic polyneuropathy, without long-term current use of insulin [facility])   [place] [facility] at [facility], [place], NP       Future Appointments             In 1 month Rema Fendt, NP [facility]   In 4 months Mealor, Roberts Gaudy, MD [facility] at [facility], [facility]

## 2023-07-07 ENCOUNTER — Other Ambulatory Visit: Payer: Self-pay

## 2023-07-07 ENCOUNTER — Ambulatory Visit (HOSPITAL_COMMUNITY): Payer: Medicare Other | Attending: Cardiovascular Disease

## 2023-07-07 DIAGNOSIS — I428 Other cardiomyopathies: Secondary | ICD-10-CM

## 2023-07-07 DIAGNOSIS — R008 Other abnormalities of heart beat: Secondary | ICD-10-CM | POA: Diagnosis not present

## 2023-07-07 DIAGNOSIS — I5032 Chronic diastolic (congestive) heart failure: Secondary | ICD-10-CM | POA: Diagnosis not present

## 2023-07-07 DIAGNOSIS — I493 Ventricular premature depolarization: Secondary | ICD-10-CM

## 2023-07-07 DIAGNOSIS — R001 Bradycardia, unspecified: Secondary | ICD-10-CM

## 2023-07-07 LAB — ECHOCARDIOGRAM LIMITED
Area-P 1/2: 3.17 cm2
S' Lateral: 2.9 cm

## 2023-07-08 ENCOUNTER — Ambulatory Visit (HOSPITAL_COMMUNITY): Admit: 2023-07-08 | Payer: Medicare Other | Admitting: Cardiovascular Disease

## 2023-07-08 ENCOUNTER — Encounter (HOSPITAL_COMMUNITY): Payer: Medicare Other

## 2023-07-08 ENCOUNTER — Other Ambulatory Visit: Payer: Self-pay

## 2023-07-08 SURGERY — PVC ABLATION
Anesthesia: General

## 2023-07-14 ENCOUNTER — Telehealth: Payer: Self-pay | Admitting: Family

## 2023-07-30 DIAGNOSIS — H40013 Open angle with borderline findings, low risk, bilateral: Secondary | ICD-10-CM | POA: Diagnosis not present

## 2023-08-04 ENCOUNTER — Other Ambulatory Visit: Payer: Self-pay

## 2023-08-04 ENCOUNTER — Ambulatory Visit (INDEPENDENT_AMBULATORY_CARE_PROVIDER_SITE_OTHER): Payer: Medicare Other | Admitting: Family

## 2023-08-04 ENCOUNTER — Encounter: Payer: Self-pay | Admitting: Family

## 2023-08-04 VITALS — BP 129/82 | HR 57 | Temp 98.6°F | Ht 67.0 in | Wt 170.6 lb

## 2023-08-04 DIAGNOSIS — E1165 Type 2 diabetes mellitus with hyperglycemia: Secondary | ICD-10-CM

## 2023-08-04 DIAGNOSIS — E119 Type 2 diabetes mellitus without complications: Secondary | ICD-10-CM | POA: Diagnosis not present

## 2023-08-04 DIAGNOSIS — Z7984 Long term (current) use of oral hypoglycemic drugs: Secondary | ICD-10-CM | POA: Diagnosis not present

## 2023-08-04 LAB — POCT GLYCOSYLATED HEMOGLOBIN (HGB A1C): HbA1c, POC (controlled diabetic range): 6 % (ref 0.0–7.0)

## 2023-08-04 MED ORDER — METFORMIN HCL 1000 MG PO TABS
1000.0000 mg | ORAL_TABLET | Freq: Every day | ORAL | 0 refills | Status: DC
Start: 2023-08-04 — End: 2023-09-01
  Filled 2023-08-04: qty 90, 90d supply, fill #0

## 2023-08-04 MED ORDER — GLIPIZIDE ER 10 MG PO TB24
10.0000 mg | ORAL_TABLET | Freq: Every day | ORAL | 0 refills | Status: DC
Start: 2023-08-04 — End: 2023-09-01
  Filled 2023-08-04: qty 90, 90d supply, fill #0

## 2023-08-04 NOTE — Progress Notes (Signed)
Patient ID: Todd Greer, male    DOB: May 27, 1956  MRN: [id number]  CC: Chronic Conditions Follow-Up  Subjective: Todd Greer is a 67 y.o. male who presents for chronic conditions follow-up.   His concerns today include:  Doing well on Metformin and Glipizide, no issues/concerns. States he would like to decrease the dose of Metformin and Glipizide. Reports up to date with diabetic eye exam. He denies red flag symptoms associated with diabetes.   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     atorvastatin (LIPITOR) 20 MG tablet Take 1 tablet (20 mg total) by mouth daily. 90 tablet 0   Blood Glucose Monitoring Suppl (ONE TOUCH ULTRA 2) w/Device KIT 1 Device by Does not apply route 4 (four) times daily -  before meals and at bedtime. 1 kit 0   diltiazem (CARDIZEM CD) 240 MG 24 hr capsule Take 1 capsule (240 mg total) by mouth  daily. 90 capsule 3   ENTRESTO 49-51 MG Take 1 tablet by mouth 2 (two) times daily. 180 tablet 2   glucose blood (ONETOUCH ULTRA) test strip Check blood sugars 4 times a day before and after meals 100 each 12   Magnesium 400 MG CAPS Take 400 mg by mouth 2 (two) times daily. 90 capsule 3   Multiple Vitamin (ONE-A-DAY MENS PO) Take 1 tablet by mouth daily.     OneTouch Delica Lancets 33G MISC Check blood sugars 4 times a day before and after meals 100 each 12   sildenafil (VIAGRA) 100 MG tablet Take 1 tablet (100 mg total) by mouth daily as needed for erectile dysfunction. 8 tablet 11   spironolactone (ALDACTONE) 25 MG tablet Take 1 tablet (25 mg total) by mouth daily. 90 tablet 0   tadalafil (CIALIS) 20 MG tablet Take 20 mg by mouth daily as needed.     No current facility-administered medications on file prior to visit.    Allergies  Allergen Reactions   Ace Inhibitors Cough    Tolerates ARB   Amlodipine Other (See Comments)    Fatigue   Lisinopril     Social History   Socioeconomic History   Marital status: Married    Spouse name: Not on file   Number of children: Not on file   Years of education: Not on file   Highest education level: Not on file  Occupational History   Not on file  Tobacco Use   Smoking status: Former  Current packs/day: 0.00    Average packs/day: 0.5 packs/day for 10.0 years (5.0 ttl pk-yrs)    Types: Cigarettes    Start date: 10/03/1973    Quit date: 10/04/1983    Years since quitting: 39.8    Passive exposure: Past   Smokeless tobacco: Never  Vaping Use   Vaping status: Never Used  Substance and Sexual Activity   Alcohol use: Yes    Alcohol/week: 6.0 standard drinks of alcohol    Types: 6 Standard drinks or equivalent per week   Drug use: No   Sexual activity: Yes  Other Topics Concern   Not on file  Social History Narrative   His married father of 4.  His married for 28 years.  He lives with his wife and daughters.  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  Education: [employer].   He quit smoking in 1985.  He drinks 3 beers a week.   Prior to the onset of his current symptoms, used to work out with weights for at least 40 minutes a day for 3 times a week.  He usually did light weights for many occasions as opposed to heavy weights.      Contacts: Wife [name]; daughter [name]   Social Determinants of Health   Financial Resource Strain: Not on file  Food Insecurity: Not on file  Transportation Needs: Not on file  Physical Activity: Not on file  Stress: Not on file  Social Connections: Not on file  Intimate Partner Violence: Not on file    Family History  Problem Relation Age of Onset   Stroke Father    Diabetes Father    Hypertension Father    Cancer Sister    Cerebral aneurysm Mother        Died at a young age.   Diabetes Paternal Grandmother    Hypertension Paternal Grandmother    Stroke Paternal Grandmother     Past Surgical History:  Procedure Laterality Date   LEFT HEART CATH AND CORONARY ANGIOGRAPHY N/A 09/09/2017   Procedure: LEFT HEART CATH AND CORONARY ANGIOGRAPHY;  Surgeon: Rinaldo Cloud, MD;  Location: [facility];  Service: Cardiovascular; EF 15 to 20%.  Diffuse HK.  Normal LVEDP.  Normal coronaries.   NM  EXERCISE MYOVIEW LTD  06/02/2013   Good exercise capacity.  Hypertensive response to exercise.  Dyspneic on exercise but no significant ST changes to suggest ischemia.  Normal stress images.  No ischemia or infarction   TRANSTHORACIC ECHOCARDIOGRAM  09/06/2017   EF 15 to 20%.  Diffuse HK.  Severe LA dilation.  Mild RV dilation.  Mild MR.    ROS: Review of Systems Negative except as stated above  PHYSICAL EXAM: BP 129/82   Pulse (!) 57   Temp 98.6 F (37 C) (Oral)   Ht 5\' 7"  (1.702 m)   Wt 170 lb 9.6 oz (77.4 kg)   SpO2 97%   BMI 26.72 kg/m   Physical Exam HENT:     Head: Normocephalic and atraumatic.     Nose: Nose normal.      Mouth/Throat:     Mouth: Mucous membranes are moist.     Pharynx: Oropharynx is clear.  Eyes:     Extraocular Movements: Extraocular movements intact.     Conjunctiva/sclera: Conjunctivae normal.     Pupils: Pupils are equal, round, and reactive to light.  Cardiovascular:     Rate and Rhythm: Bradycardia present.  Pulses: Normal pulses.     Heart sounds: Normal heart sounds.  Pulmonary:     Effort: Pulmonary effort is normal.     Breath sounds: Normal breath sounds.  Musculoskeletal:        General: Normal range of motion.     Cervical back: Normal range of motion and neck supple.  Neurological:     General: No focal deficit present.     Mental Status: He is alert and oriented to person, place, and time.  Psychiatric:        Mood and Affect: Mood normal.        Behavior: Behavior normal.     ASSESSMENT AND PLAN: 1. Type 2 diabetes mellitus with hyperglycemia, without long-term current use of insulin (HCC) - Hemoglobin A1c at goal at 6.0%, goal 8%. This is improved compared to previous 6.5%.  - Patient states he has goal of decreasing Metformin and Glipizide doses.  - Decrease Metformin from 1000 mg twice daily to 1000 mg once daily.  - Decrease Glipizide from 20 mg daily to 10 mg daily. - Discussed the importance of healthy eating habits, low-carbohydrate diet, low-sugar diet, regular aerobic exercise (at least 150 minutes a week as tolerated) and medication compliance to achieve or maintain control of diabetes. - Follow-up with primary provider in 4 weeks or sooner if needed.  - POCT glycosylated hemoglobin (Hb A1C) - metFORMIN (GLUCOPHAGE) 1000 MG tablet; Take 1 tablet (1,000 mg total) by mouth daily with breakfast.  Dispense: 90 tablet; Refill: 0 - glipiZIDE (GLIPIZIDE XL) 10 MG 24 hr tablet; Take 1 tablet (10 mg total) by mouth daily.  Dispense: 90 tablet; Refill: 0  2. Encounter for diabetic foot exam [facility]) - Referral to Podiatry for evaluation/management.  -  Ambulatory referral to Podiatry  Patient was given the opportunity to ask questions.  Patient verbalized understanding of the plan and was able to repeat key elements of the plan. Patient was given clear instructions to go to Emergency Department or return to medical center if symptoms don't improve, worsen, or new problems develop.The patient verbalized understanding.   Orders Placed This Encounter  Procedures   POCT glycosylated hemoglobin (Hb A1C)     Requested Prescriptions   Signed Prescriptions Disp Refills   metFORMIN (GLUCOPHAGE) 1000 MG tablet 90 tablet 0    Sig: Take 1 tablet (1,000 mg total) by mouth daily with breakfast.   glipiZIDE (GLIPIZIDE XL) 10 MG 24 hr tablet 90 tablet 0    Sig: Take 1 tablet (10 mg total) by mouth daily.    Return in about 4 weeks (around 09/01/2023) for Follow-Up or next available chronic conditions.  Rema Fendt, NP

## 2023-08-04 NOTE — Progress Notes (Signed)
Patient states he wants to have his glipizide and metformin reduced.

## 2023-08-20 ENCOUNTER — Ambulatory Visit: Payer: Medicare Other | Admitting: Podiatry

## 2023-08-20 ENCOUNTER — Encounter: Payer: Self-pay | Admitting: Podiatry

## 2023-08-20 DIAGNOSIS — E119 Type 2 diabetes mellitus without complications: Secondary | ICD-10-CM | POA: Diagnosis not present

## 2023-08-20 NOTE — Progress Notes (Signed)
Subjective: Todd Greer presents today referred by Rema Fendt, NP for diabetic foot evaluation.  Patient relates many year history of diabetes.  Patient denies any history of foot wounds.  Patient denies any history of numbness, tingling, burning, pins/needles sensations.  Past Medical History:  Diagnosis Date   Anxiety    Chronic combined systolic and diastolic congestive heart failure, NYHA class 2 (HCC) 09/2017   Has been maintained on excellent medications: Carvedilol 12.5 mg BID, Entresto 49 -51 mg p.o. BID, empagliflozin 10 mg daily, spironolactone 25 mg daily.   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  Mild RV dilation.  Mild MR.  Catheterization showed EF 25% normal EDP and normal coronaries (has been followed by Dr. 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/23/2015   Hypertrophy of prostate without urinary obstruction and other lower urinary tract symptoms (LUTS) 12/03/2013   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   History of colonic polyps 07/20/2013   Shortness of breath 06/01/2013    Class: Acute   Obesity (BMI 30-39.9) 06/01/2013   Abnormal resting ECG findings - bifascicular block with PVCs 06/01/2013   DM II  (diabetes mellitus, type II), controlled (HCC) 05/28/2013   DYSLIPIDEMIA 02/11/2007   ANXIETY DISORDER 02/11/2007   Essential hypertension 02/11/2007    Past Surgical History:  Procedure Laterality Date   LEFT HEART CATH AND CORONARY ANGIOGRAPHY N/A 09/09/2017   Procedure: LEFT HEART CATH AND CORONARY ANGIOGRAPHY;  Surgeon: Rinaldo Cloud, MD;  Location: [facility];  Service: Cardiovascular; EF 15 to 20%.  Diffuse HK.  Normal LVEDP.  Normal coronaries.   NM  EXERCISE MYOVIEW LTD  06/02/2013   Good exercise capacity.  Hypertensive response to exercise.  Dyspneic on exercise but no significant ST changes to suggest ischemia.  Normal stress images.  No ischemia or infarction   TRANSTHORACIC ECHOCARDIOGRAM  09/06/2017   EF 15 to 20%.  Diffuse HK.  Severe LA dilation.  Mild RV dilation.  Mild MR.    Current Outpatient Medications on File Prior to Visit  Medication Sig Dispense Refill   aspirin 81 MG tablet Take 81 mg by mouth daily.     atorvastatin (LIPITOR) 20 MG tablet Take 1 tablet (20 mg total) by mouth daily. 90 tablet 0   Blood Glucose Monitoring Suppl (ONE TOUCH ULTRA 2) w/Device KIT 1 Device by Does not apply route 4 (four) times daily -  before meals and at bedtime. 1 kit 0   diltiazem (CARDIZEM CD) 240 MG 24 hr capsule Take 1 capsule (240 mg total) by mouth daily. 90 capsule 3   ENTRESTO 49-51 MG Take 1 tablet by mouth 2 (two) times daily. 180 tablet 2   glipiZIDE (GLIPIZIDE XL) 10 MG 24 hr tablet  Take 1 tablet (10 mg total) by mouth daily. 90 tablet 0   glucose blood (ONETOUCH ULTRA) test strip Check blood sugars 4 times a day before and after meals 100 each 12   Magnesium 400 MG CAPS Take 400 mg by mouth 2 (two) times daily. 90 capsule 3   metFORMIN (GLUCOPHAGE) 1000 MG tablet Take 1 tablet (1,000 mg total) by mouth daily with breakfast. 90 tablet 0   Multiple Vitamin (ONE-A-DAY MENS PO) Take 1 tablet by mouth daily.     OneTouch Delica Lancets 33G MISC Check blood sugars  4 times a day before and after meals 100 each 12   sildenafil (VIAGRA) 100 MG tablet Take 1 tablet (100 mg total) by mouth daily as needed for erectile dysfunction. 8 tablet 11   spironolactone (ALDACTONE) 25 MG tablet Take 1 tablet (25 mg total) by mouth daily. 90 tablet 0   tadalafil (CIALIS) 20 MG tablet Take 20 mg by mouth daily as needed.     No current facility-administered medications on file prior to visit.     Allergies  Allergen Reactions   Ace Inhibitors Cough    Tolerates ARB   Amlodipine Other (See Comments)    Fatigue   Lisinopril     Social History   Occupational History   Not on file  Tobacco Use   Smoking status: Former    Current packs/day: 0.00    Average packs/day: 0.5 packs/day for 10.0 years (5.0 ttl pk-yrs)    Types: Cigarettes    Start date: 10/03/1973    Quit date: 10/04/1983    Years since quitting: 39.9    Passive exposure: Past   Smokeless tobacco: Never  Vaping Use   Vaping status: Never Used  Substance and Sexual Activity   Alcohol use: Yes    Alcohol/week: 6.0 standard drinks of alcohol    Types: 6 Standard drinks or equivalent per week   Drug use: No   Sexual activity: Yes    Family History  Problem Relation Age of Onset   Stroke Father    Diabetes Father    Hypertension Father    Cancer Sister    Cerebral aneurysm Mother        Died at a young age.   Diabetes Paternal Grandmother    Hypertension Paternal Grandmother    Stroke Paternal Grandmother     Immunization History  Administered Date(s) Administered   Fluad Quad(high Dose 65+) 07/29/2022   Hepatitis B 09/02/2005   Influenza,inj,Quad PF,6+ Mos 05/28/2013, 06/03/2014, 06/29/2015, 06/08/2021   Influenza-Unspecified 08/18/2020   PFIZER(Purple Top)SARS-COV-2 Vaccination 09/23/2019, 10/14/2019, 08/18/2020   PNEUMOCOCCAL CONJUGATE-20 06/08/2021   Pneumococcal Polysaccharide-23 05/28/2013   Tdap 02/25/2013    Review of systems: Positive Findings in bold  print.  Constitutional:  chills, fatigue, fever, sweats, weight change Communication: Nurse, learning disability, sign Presenter, broadcasting, hand writing, iPad/Android device Head: headaches, head injury Eyes: changes in vision, eye pain, glaucoma, cataracts, macular degeneration, diplopia, glare,  light sensitivity, eyeglasses or contacts, blindness Ears nose mouth throat: hearing impaired, hearing aids,  ringing in ears, deaf, sign language,  vertigo, nosebleeds,  rhinitis,  cold sores, snoring, swollen glands Cardiovascular: HTN, edema, arrhythmia, pacemaker in place, defibrillator in place, chest pain/tightness, chronic anticoagulation, blood clot, heart failure, MI Peripheral Vascular: leg cramps, varicose veins, blood clots, lymphedema, varicosities Respiratory:  asthma, difficulty breathing, denies congestion, SOB, wheezing, cough, emphysema Gastrointestinal: change in appetite or weight, abdominal pain, constipation, diarrhea, nausea, vomiting, vomiting blood, change in bowel habits, abdominal pain, jaundice,  rectal bleeding, hemorrhoids, GERD Genitourinary:  nocturia,  pain on urination, polyuria,  blood in urine, Foley catheter, urinary urgency, ESRD on hemodialysis Musculoskeletal: amputation, cramping, stiff joints, painful joints, decreased joint motion, fractures, OA, gout, hemiplegia, paraplegia, uses cane, wheelchair bound, uses walker, uses rollator Skin: +changes in toenails, color change, dryness, itching, mole changes,  rash, wound(s) Neurological: headaches, numbness in feet, paresthesias in feet, burning in feet, fainting,  seizures, change in speech, migraines, memory problems/poor historian, cerebral palsy, weakness, paralysis, CVA, TIA Endocrine: diabetes, hypothyroidism, hyperthyroidism,  goiter, dry mouth, flushing, heat intolerance, cold intolerance,  excessive thirst, denies polyuria,  nocturia Hematological:  easy bleeding, excessive bleeding, easy bruising, enlarged lymph nodes, on long  term blood thinner, history of past transusions Allergy/immunological:  hives, eczema, frequent infections, multiple drug allergies, seasonal allergies, transplant recipient, multiple food allergies Psychiatric:  anxiety, depression, mood disorder, suicidal ideations, hallucinations, insomnia  Objective: There were no vitals filed for this visit. Vascular Examination: Capillary refill time less than 3 seconds x 10 digits.  Dorsalis pedis pulses palpable 2 out of 4.  Posterior tibial pulses palpable 2 out of 4.  Digital hair not present x 10 digits.  Skin temperature gradient WNL b/l.  Dermatological Examination: Skin with normal turgor, texture and tone b/l  Toenails 1-5 b/l discolored, thick, dystrophic with subungual debris and pain with palpation to nailbeds due to thickness of nails.  Musculoskeletal: Muscle strength 5/5 to all LE muscle groups.  Neurological: Sensation intact with 10 gram monofilament.  Vibratory sensation intact.  Assessment: NIDDM Encounter for diabetic foot examination  Plan: Discussed diabetic foot care principles. Literature dispensed on today. Patient to continue soft, supportive shoe gear daily. Patient to report any pedal injuries to medical professional immediately. Follow up one year. Patient/POA to call should there be a concern in the interim.

## 2023-08-28 ENCOUNTER — Other Ambulatory Visit: Payer: Self-pay

## 2023-08-28 ENCOUNTER — Other Ambulatory Visit: Payer: Self-pay | Admitting: Family

## 2023-08-28 MED ORDER — ATORVASTATIN CALCIUM 20 MG PO TABS
20.0000 mg | ORAL_TABLET | Freq: Every day | ORAL | 0 refills | Status: DC
  Filled 2023-08-28 – 2023-10-08 (×2): qty 90, 90d supply, fill #0

## 2023-09-01 ENCOUNTER — Other Ambulatory Visit: Payer: Self-pay

## 2023-09-01 ENCOUNTER — Encounter: Payer: Self-pay | Admitting: Family

## 2023-09-01 ENCOUNTER — Ambulatory Visit (INDEPENDENT_AMBULATORY_CARE_PROVIDER_SITE_OTHER): Payer: Medicare Other | Admitting: Family

## 2023-09-01 VITALS — BP 122/73 | HR 49 | Temp 98.2°F | Ht 67.0 in | Wt 166.4 lb

## 2023-09-01 DIAGNOSIS — E1165 Type 2 diabetes mellitus with hyperglycemia: Secondary | ICD-10-CM

## 2023-09-01 DIAGNOSIS — Z7984 Long term (current) use of oral hypoglycemic drugs: Secondary | ICD-10-CM

## 2023-09-01 LAB — POCT GLYCOSYLATED HEMOGLOBIN (HGB A1C): HbA1c, POC (controlled diabetic range): 5.9 % (ref 0.0–7.0)

## 2023-09-01 MED ORDER — METFORMIN HCL 1000 MG PO TABS
500.0000 mg | ORAL_TABLET | Freq: Every day | ORAL | 1 refills | Status: DC
Start: 2023-09-01 — End: 2024-04-21
  Filled 2023-09-01 – 2023-11-04 (×2): qty 30, 60d supply, fill #0
  Filled 2024-01-05: qty 30, 60d supply, fill #1

## 2023-09-01 MED ORDER — GLIPIZIDE ER 5 MG PO TB24
5.0000 mg | ORAL_TABLET | Freq: Every day | ORAL | 1 refills | Status: DC
Start: 2023-09-01 — End: 2023-10-01
  Filled 2023-09-01: qty 30, 30d supply, fill #0

## 2023-09-01 NOTE — Progress Notes (Signed)
Patient ID: Todd Greer, male    DOB: [birth year]  MRN: [id number]  CC: Chronic Conditions Follow-Up  Subjective: Todd Greer is a 67 y.o. male who presents for chronic conditions follow-up.   His concerns today include:  Doing well on Metformin and Glipizide, no issues/concerns. Denies red flag symptoms associated with diabetes.   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     atorvastatin (LIPITOR) 20 MG tablet Take 1 tablet (20 mg total) by mouth daily. 90 tablet 0   Blood Glucose Monitoring Suppl (ONE TOUCH ULTRA 2) w/Device KIT 1 Device by Does not apply route 4 (four) times daily -  before meals and at bedtime. 1 kit 0   diltiazem (CARDIZEM CD) 240 MG 24 hr capsule Take 1 capsule (240 mg total) by mouth daily. 90 capsule 3   ENTRESTO 49-51 MG Take 1 tablet by mouth 2 (two) times daily. 180 tablet 2   glucose blood  (ONETOUCH ULTRA) test strip Check blood sugars 4 times a day before and after meals 100 each 12   Magnesium 400 MG CAPS Take 400 mg by mouth 2 (two) times daily. 90 capsule 3   Multiple Vitamin (ONE-A-DAY MENS PO) Take 1 tablet by mouth daily.     OneTouch Delica Lancets 33G MISC Check blood sugars 4 times a day before and after meals 100 each 12   sildenafil (VIAGRA) 100 MG tablet Take 1 tablet (100 mg total) by mouth daily as needed for erectile dysfunction. 8 tablet 11   spironolactone (ALDACTONE) 25 MG tablet Take 1 tablet (25 mg total) by mouth daily. 90 tablet 0   tadalafil (CIALIS) 20 MG tablet Take 20 mg by mouth daily as needed.     No current facility-administered medications on file prior to visit.    Allergies  Allergen Reactions   Ace Inhibitors Cough    Tolerates ARB   Amlodipine Other (See Comments)    Fatigue   Lisinopril     Social History   Socioeconomic History   Marital status: Married    Spouse name: Not on file   Number of children: Not on file   Years of education: Not on file   Highest education level: Not on file  Occupational History   Not on file  Tobacco Use   Smoking status: Former    Current packs/day: 0.00    Average packs/day: 0.5 packs/day for 10.0 years (5.0 ttl pk-yrs)  Types: Cigarettes    Start date: 10/03/1973    Quit date: 10/04/1983    Years since quitting: 39.9    Passive exposure: Past   Smokeless tobacco: Never  Vaping Use   Vaping status: Never Used  Substance and Sexual Activity   Alcohol use: Yes    Alcohol/week: 6.0 standard drinks of alcohol    Types: 6 Standard drinks or equivalent per week   Drug use: No   Sexual activity: Yes  Other Topics Concern   Not on file  Social History Narrative   His married father of 4.  His married for 28 years.  He lives with his wife and daughters.  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  Education: [employer].   He quit smoking in 1985.  He  drinks 3 beers a week.   Prior to the onset of his current symptoms, used to work out with weights for at least 40 minutes a day for 3 times a week.  He usually did light weights for many occasions as opposed to heavy weights.      Contacts: Wife [name]; daughter [name]   Social Drivers of Health   Financial Resource Strain: Low Risk  (08/04/2023)   Overall Financial Resource Strain (CARDIA)    Difficulty of Paying Living Expenses: Not hard at all  Food Insecurity: No Food Insecurity (08/04/2023)   Hunger Vital Sign    Worried About Running Out of Food in the Last Year: Never true    Ran Out of Food in the Last Year: Never true  Transportation Needs: No Transportation Needs (08/04/2023)   PRAPARE - Administrator, Civil Service (Medical): No    Lack of Transportation (Non-Medical): No  Physical Activity: Not on file  Stress: No Stress Concern Present (08/04/2023)   [employer] of Occupational Health - Occupational Stress Questionnaire    Feeling of Stress : Not at all  Social Connections: Not on file  Intimate Partner Violence: Not At Risk (08/04/2023)   Humiliation, Afraid, Rape, and Kick questionnaire    Fear of Current or Ex-Partner: No    Emotionally Abused: No    Physically Abused: No    Sexually Abused: No    Family History  Problem Relation Age of Onset   Stroke Father    Diabetes Father    Hypertension Father    Cancer Sister    Cerebral aneurysm Mother        Died at a young age.   Diabetes Paternal Grandmother    Hypertension Paternal Grandmother    Stroke Paternal Grandmother     Past Surgical History:  Procedure Laterality Date   LEFT HEART CATH AND CORONARY ANGIOGRAPHY N/A 09/09/2017   Procedure: LEFT HEART CATH AND CORONARY ANGIOGRAPHY;  Surgeon: Rinaldo Cloud, MD;  Location: [facility];  Service: Cardiovascular; EF 15 to 20%.  Diffuse HK.  Normal LVEDP.  Normal coronaries.   NM  EXERCISE MYOVIEW LTD  06/02/2013   Good  exercise capacity.  Hypertensive response to exercise.  Dyspneic on exercise but no significant ST changes to suggest ischemia.  Normal stress images.  No ischemia or infarction   TRANSTHORACIC ECHOCARDIOGRAM  09/06/2017   EF 15 to 20%.  Diffuse HK.  Severe LA dilation.  Mild RV dilation.  Mild MR.    ROS: Review of Systems Negative except as stated above  PHYSICAL EXAM: BP 122/73   Pulse (!) 49   Temp  98.2 F (36.8 C) (Oral)   Ht 5\' 7"  (1.702 m)   Wt 166 lb 6.4 oz (75.5 kg)   SpO2 98%   BMI 26.06 kg/m   Physical Exam HENT:     Head: Normocephalic and atraumatic.     Nose: Nose normal.     Mouth/Throat:     Mouth: Mucous membranes are moist.     Pharynx: Oropharynx is clear.  Eyes:     Extraocular Movements: Extraocular movements intact.     Conjunctiva/sclera: Conjunctivae normal.     Pupils: Pupils are equal, round, and reactive to light.  Cardiovascular:     Rate and Rhythm: Bradycardia present.     Pulses: Normal pulses.     Heart sounds: Normal heart sounds.  Pulmonary:     Effort: Pulmonary effort is normal.     Breath sounds: Normal breath sounds.  Musculoskeletal:        General: Normal range of motion.     Cervical back: Normal range of motion and neck supple.  Neurological:     General: No focal deficit present.     Mental Status: He is alert and oriented to person, place, and time.  Psychiatric:        Mood and Affect: Mood normal.        Behavior: Behavior normal.    Results for orders placed or performed in visit on 09/01/23  POCT glycosylated hemoglobin (Hb A1C)  Result Value Ref Range   Hemoglobin A1C     HbA1c POC (<> result, manual entry)     HbA1c, POC (prediabetic range)     HbA1c, POC (controlled diabetic range) 5.9 0.0 - 7.0 %    ASSESSMENT AND PLAN: 1. Type 2 diabetes mellitus with hyperglycemia, without long-term current use of insulin (HCC) (Primary) - Hemoglobin A1c at goal at 5.9%, goal 8%. This is improved compared to previous  6.0%. - Patient has goal of decreasing Metformin and Glipizide doses.  - Decrease Metformin from 1000 mg daily to 500 mg daily.  - Decrease Glipizide from 10 mg daily to 5 mg daily.  - Discussed the importance of healthy eating habits, low-carbohydrate diet, low-sugar diet, regular aerobic exercise (at least 150 minutes a week as tolerated) and medication compliance to achieve or maintain control of diabetes. - Follow-up with primary provider in 4 weeks or sooner if needed.  - POCT glycosylated hemoglobin (Hb A1C) - metFORMIN (GLUCOPHAGE) 1000 MG tablet; Take 0.5 tablets (500 mg total) by mouth daily with breakfast.  Dispense: 30 tablet; Refill: 1 - glipiZIDE (GLIPIZIDE XL) 5 MG 24 hr tablet; Take 1 tablet (5 mg total) by mouth daily.  Dispense: 30 tablet; Refill: 1    Patient was given the opportunity to ask questions.  Patient verbalized understanding of the plan and was able to repeat key elements of the plan. Patient was given clear instructions to go to Emergency Department or return to medical center if symptoms don't improve, worsen, or new problems develop.The patient verbalized understanding.   Orders Placed This Encounter  Procedures   POCT glycosylated hemoglobin (Hb A1C)     Requested Prescriptions   Signed Prescriptions Disp Refills   metFORMIN (GLUCOPHAGE) 1000 MG tablet 30 tablet 1    Sig: Take 0.5 tablets (500 mg total) by mouth daily with breakfast.   glipiZIDE (GLIPIZIDE XL) 5 MG 24 hr tablet 30 tablet 1    Sig: Take 1 tablet (5 mg total) by mouth daily.    Return in about 4  weeks (around 09/29/2023) for Follow-Up or next available chronic conditions.  Rema Fendt, NP

## 2023-09-01 NOTE — Progress Notes (Signed)
ASPatient states no other concerns to discuss.

## 2023-09-09 ENCOUNTER — Other Ambulatory Visit: Payer: Self-pay

## 2023-09-10 ENCOUNTER — Other Ambulatory Visit: Payer: Self-pay

## 2023-10-01 ENCOUNTER — Ambulatory Visit (INDEPENDENT_AMBULATORY_CARE_PROVIDER_SITE_OTHER): Payer: Medicare Other | Admitting: Family

## 2023-10-01 ENCOUNTER — Encounter: Payer: Self-pay | Admitting: Family

## 2023-10-01 ENCOUNTER — Other Ambulatory Visit: Payer: Self-pay

## 2023-10-01 VITALS — BP 135/71 | HR 52 | Temp 98.2°F

## 2023-10-01 DIAGNOSIS — E1165 Type 2 diabetes mellitus with hyperglycemia: Secondary | ICD-10-CM | POA: Diagnosis not present

## 2023-10-01 DIAGNOSIS — Z7984 Long term (current) use of oral hypoglycemic drugs: Secondary | ICD-10-CM | POA: Diagnosis not present

## 2023-10-01 DIAGNOSIS — E119 Type 2 diabetes mellitus without complications: Secondary | ICD-10-CM

## 2023-10-01 LAB — POCT GLYCOSYLATED HEMOGLOBIN (HGB A1C): HbA1c, POC (controlled diabetic range): 5.9 % (ref 0.0–7.0)

## 2023-10-01 MED ORDER — GLIPIZIDE ER 2.5 MG PO TB24
2.5000 mg | ORAL_TABLET | Freq: Every day | ORAL | 1 refills | Status: DC
  Filled 2023-10-01: qty 30, 30d supply, fill #0

## 2023-10-01 NOTE — Progress Notes (Signed)
Patient ID: Todd Greer, male    DOB: Sep 06, 1955  MRN: [id number]  CC: Chronic Conditions Follow-Up  Subjective: Alik Mawson is a 68 y.o. male who presents for chronic conditions follow-up.   His concerns today include:  - Doing well on Metformin and Glipizide, no issues/concerns. Denies red flag symptoms associated with diabetes. States would like referral to nutritionist.    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     atorvastatin (LIPITOR) 20 MG tablet Take 1 tablet (20 mg total) by mouth daily. 90 tablet 0   Blood Glucose Monitoring Suppl (ONE TOUCH ULTRA 2) w/Device KIT 1 Device by Does not apply route 4 (four) times daily -  before meals and at bedtime. 1 kit 0   diltiazem (CARDIZEM CD) 240 MG 24 hr capsule Take 1 capsule (240 mg total) by mouth daily. 90 capsule 3   ENTRESTO 49-51 MG Take 1 tablet by mouth 2 (two)  times daily. 180 tablet 2   glucose blood (ONETOUCH ULTRA) test strip Check blood sugars 4 times a day before and after meals 100 each 12   Magnesium 400 MG CAPS Take 400 mg by mouth 2 (two) times daily. 90 capsule 3   metFORMIN (GLUCOPHAGE) 1000 MG tablet Take 0.5 tablets (500 mg total) by mouth daily with breakfast. 30 tablet 1   Multiple Vitamin (ONE-A-DAY MENS PO) Take 1 tablet by mouth daily.     OneTouch Delica Lancets 33G MISC Check blood sugars 4 times a day before and after meals 100 each 12   sildenafil (VIAGRA) 100 MG tablet Take 1 tablet (100 mg total) by mouth daily as needed for erectile dysfunction. 8 tablet 11   spironolactone (ALDACTONE) 25 MG tablet Take 1 tablet (25 mg total) by mouth daily. 90 tablet 0   tadalafil (CIALIS) 20 MG tablet Take 20 mg by mouth daily as needed.     No current facility-administered medications on file prior to visit.    Allergies  Allergen Reactions   Ace Inhibitors Cough    Tolerates ARB   Amlodipine Other (See Comments)    Fatigue   Lisinopril     Social History   Socioeconomic History   Marital status: Married    Spouse name: Not on file   Number of children: Not on file   Years of education: Not on file   Highest education level: Not on file  Occupational History   Not on file  Tobacco Use   Smoking status: Former    Current packs/day: 0.00    Average packs/day: 0.5 packs/day for 10.0 years (5.0 ttl pk-yrs)    Types: Cigarettes    Start date: 10/03/1973    Quit date: 10/04/1983    Years since quitting: 40.0    Passive exposure: Past   Smokeless tobacco: Never  Vaping Use   Vaping status: Never Used  Substance and Sexual Activity   Alcohol use: Yes    Alcohol/week: 6.0 standard drinks of alcohol    Types: 6 Standard drinks or equivalent per week   Drug use: No   Sexual activity: Yes  Other Topics Concern   Not on file  Social History Narrative   His married father of 4.  His married for 28 years.  He lives with his  wife and daughters.  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  Education: [employer].   He quit smoking in 1985.  He drinks 3 beers a week.   Prior to the onset of his current symptoms, used to work out with weights for at least 40 minutes a day for 3 times a week.  He usually did light weights for many occasions as opposed to heavy weights.      Contacts: Wife [name]; daughter [name]   Social Drivers of Health   Financial Resource Strain: Low Risk  (08/04/2023)   Overall Financial Resource Strain (CARDIA)    Difficulty of Paying Living Expenses: Not hard at all  Food Insecurity: No Food Insecurity (08/04/2023)   Hunger Vital Sign    Worried About Running Out of Food in the Last Year: Never true    Ran Out of Food in the Last Year: Never true  Transportation Needs: No Transportation Needs (08/04/2023)   PRAPARE - Administrator, Civil Service (Medical): No    Lack of Transportation (Non-Medical): No  Physical Activity: Not on file  Stress: No Stress Concern Present (08/04/2023)   [employer] of Occupational Health - Occupational Stress Questionnaire    Feeling of Stress : Not at all  Social Connections: Not on file  Intimate Partner Violence: Not At Risk (08/04/2023)   Humiliation, Afraid, Rape, and Kick questionnaire    Fear of Current or Ex-Partner: No    Emotionally Abused: No    Physically Abused: No    Sexually Abused: No    Family History  Problem Relation Age of Onset   Stroke Father    Diabetes Father    Hypertension Father    Cancer Sister    Cerebral aneurysm Mother        Died at a young age.   Diabetes Paternal Grandmother    Hypertension Paternal Grandmother    Stroke Paternal Grandmother     Past Surgical History:  Procedure Laterality Date   LEFT HEART CATH AND CORONARY ANGIOGRAPHY N/A 09/09/2017   Procedure: LEFT HEART CATH AND CORONARY ANGIOGRAPHY;  Surgeon: Rinaldo Cloud, MD;  Location:  [facility];  Service: Cardiovascular; EF 15 to 20%.  Diffuse HK.  Normal LVEDP.  Normal coronaries.   NM  EXERCISE MYOVIEW LTD  06/02/2013   Good exercise capacity.  Hypertensive response to exercise.  Dyspneic on exercise but no significant ST changes to suggest ischemia.  Normal stress images.  No ischemia or infarction   TRANSTHORACIC ECHOCARDIOGRAM  09/06/2017   EF 15 to 20%.  Diffuse HK.  Severe LA dilation.  Mild RV  dilation.  Mild MR.    ROS: Review of Systems Negative except as stated above  PHYSICAL EXAM: BP 135/71   Pulse (!) 52   Temp 98.2 F (36.8 C) (Oral)   SpO2 97%   Physical Exam HENT:     Head: Normocephalic and atraumatic.     Nose: Nose normal.     Mouth/Throat:     Mouth: Mucous membranes are moist.     Pharynx: Oropharynx is clear.  Eyes:     Extraocular Movements: Extraocular movements intact.     Conjunctiva/sclera: Conjunctivae normal.     Pupils: Pupils are equal, round, and reactive to light.  Cardiovascular:     Rate and Rhythm: Bradycardia present.     Pulses: Normal pulses.     Heart sounds: Normal heart sounds.  Pulmonary:     Effort: Pulmonary effort is normal.     Breath sounds: Normal breath sounds.  Musculoskeletal:        General: Normal range of motion.     Cervical back: Normal range of motion and neck supple.  Neurological:     General: No focal deficit present.     Mental Status: He is alert and oriented to person, place, and time.  Psychiatric:        Mood and Affect: Mood normal.        Behavior: Behavior normal.    Results for orders placed or performed in visit on 10/01/23  POCT glycosylated hemoglobin (Hb A1C)  Result Value Ref Range   Hemoglobin A1C     HbA1c POC (<> result, manual entry)     HbA1c, POC (prediabetic range)     HbA1c, POC (controlled diabetic range) 5.9 0.0 - 7.0 %     ASSESSMENT AND PLAN: 1. Type 2 diabetes mellitus with hyperglycemia, without long-term current use of insulin (HCC)  (Primary) - Hemoglobin A1c at goal at 5.9%, goal 8%.  - Discontinue Metformin from 500 mg daily.  - Decrease Glipizide from 5 mg to 2.5 mg as prescribed.  - Routine screening.  - Discussed the importance of healthy eating habits, low-carbohydrate diet, low-sugar diet, regular aerobic exercise (at least 150 minutes a week as tolerated) and medication compliance to achieve or maintain control of diabetes. - Referral to Medical Nutrition Therapy for evaluation/management. - Follow-up with primary provider in 4 weeks or sooner if needed.  - POCT glycosylated hemoglobin (Hb A1C) - Microalbumin / creatinine urine ratio - Amb ref to Medical Nutrition Therapy-MNT - glipiZIDE (GLIPIZIDE XL) 2.5 MG 24 hr tablet; Take 1 tablet (2.5 mg total) by mouth daily.  Dispense: 30 tablet; Refill: 1  2. Diabetic eye exam [facility]) - Referral to Ophthalmology for evaluation/management. - Ambulatory referral to Ophthalmology    Patient was given the opportunity to ask questions.  Patient verbalized understanding of the plan and was able to repeat key elements of the plan. Patient was given clear instructions to go to Emergency Department or return to medical center if symptoms don't improve, worsen, or new problems develop.The patient verbalized understanding.   Orders Placed This Encounter  Procedures   Microalbumin / creatinine urine ratio   Ambulatory referral to Ophthalmology   Amb ref to Medical Nutrition Therapy-MNT   POCT glycosylated hemoglobin (Hb A1C)     Requested Prescriptions   Signed Prescriptions Disp Refills   glipiZIDE (GLIPIZIDE XL) 2.5 MG 24 hr tablet 30 tablet 1    Sig: Take 1 tablet (2.5 mg total) by mouth daily.    Return  in about 4 weeks (around 10/29/2023) for Follow-Up or next available chronic conditions.  Rema Fendt, NP

## 2023-10-01 NOTE — Progress Notes (Signed)
Patient states no other concerns to discuss.  Patient left before Urine order was in chart.

## 2023-10-08 ENCOUNTER — Other Ambulatory Visit: Payer: Self-pay | Admitting: Family

## 2023-10-08 DIAGNOSIS — I10 Essential (primary) hypertension: Secondary | ICD-10-CM

## 2023-10-08 DIAGNOSIS — E1142 Type 2 diabetes mellitus with diabetic polyneuropathy: Secondary | ICD-10-CM

## 2023-10-08 DIAGNOSIS — I5022 Chronic systolic (congestive) heart failure: Secondary | ICD-10-CM

## 2023-10-09 ENCOUNTER — Other Ambulatory Visit: Payer: Self-pay

## 2023-10-09 DIAGNOSIS — I5022 Chronic systolic (congestive) heart failure: Secondary | ICD-10-CM

## 2023-10-09 DIAGNOSIS — E1142 Type 2 diabetes mellitus with diabetic polyneuropathy: Secondary | ICD-10-CM

## 2023-10-09 DIAGNOSIS — I10 Essential (primary) hypertension: Secondary | ICD-10-CM

## 2023-10-09 MED ORDER — SPIRONOLACTONE 25 MG PO TABS
25.0000 mg | ORAL_TABLET | Freq: Every day | ORAL | 0 refills | Status: DC
  Filled 2023-10-09: qty 90, 90d supply, fill #0

## 2023-10-09 MED ORDER — ONETOUCH ULTRA 2 W/DEVICE KIT
1.0000 | PACK | Freq: Three times a day (TID) | 0 refills | Status: DC
  Filled 2023-10-09 (×2): qty 1, 30d supply, fill #0

## 2023-10-09 MED ORDER — ONETOUCH ULTRA 2 W/DEVICE KIT
1.0000 | PACK | Freq: Three times a day (TID) | 0 refills | Status: DC
  Filled 2023-10-09: qty 1, fill #0
  Filled 2023-11-04: qty 1, 30d supply, fill #0

## 2023-10-10 ENCOUNTER — Other Ambulatory Visit: Payer: Self-pay

## 2023-10-13 ENCOUNTER — Other Ambulatory Visit: Payer: Self-pay

## 2023-10-23 ENCOUNTER — Encounter: Payer: Medicare Other | Admitting: Family

## 2023-10-23 NOTE — Progress Notes (Signed)
 Erroneous encounter-disregard

## 2023-10-24 ENCOUNTER — Ambulatory Visit (INDEPENDENT_AMBULATORY_CARE_PROVIDER_SITE_OTHER): Payer: Medicare Other | Admitting: Family

## 2023-10-24 VITALS — BP 154/81 | HR 41 | Temp 98.0°F | Resp 16 | Ht 67.0 in | Wt 163.0 lb

## 2023-10-24 DIAGNOSIS — E1142 Type 2 diabetes mellitus with diabetic polyneuropathy: Secondary | ICD-10-CM | POA: Diagnosis not present

## 2023-10-24 DIAGNOSIS — E1165 Type 2 diabetes mellitus with hyperglycemia: Secondary | ICD-10-CM | POA: Diagnosis not present

## 2023-10-24 DIAGNOSIS — Z7984 Long term (current) use of oral hypoglycemic drugs: Secondary | ICD-10-CM | POA: Diagnosis not present

## 2023-10-24 DIAGNOSIS — Z1211 Encounter for screening for malignant neoplasm of colon: Secondary | ICD-10-CM

## 2023-10-24 DIAGNOSIS — Z0000 Encounter for general adult medical examination without abnormal findings: Secondary | ICD-10-CM

## 2023-10-24 NOTE — Progress Notes (Signed)
 Subjective:   Todd Greer is a 68 y.o. male who presents for Medicare Annual/Subsequent preventive examination.  Visit Complete: In person  Patient Medicare AWV questionnaire was completed by the patient in office; I have confirmed that all information answered by patient is correct and no changes since this date.  Issues/concerns for discussion today: - Doing well on Glipizide, no issues/concerns. Denies red flag symptoms associated with diabetes.  - Reports upcoming appointment with Medical Nutrition Therapy.  - Established with Cardiology.   Objective:    Today's Vitals   10/24/23 1001 10/24/23 1002  BP: (!) 145/89 (!) 154/81  Pulse:  (!) 41  Resp:  16  Temp:  98 F (36.7 C)  TempSrc:  Oral  SpO2:  99%  Weight:  163 lb (73.9 kg)  Height:  5\' 7"  (1.702 m)   Body mass index is 25.53 kg/m.  Physical Exam HENT:     Head: Normocephalic and atraumatic.     Nose: Nose normal.     Mouth/Throat:     Mouth: Mucous membranes are moist.     Pharynx: Oropharynx is clear.  Eyes:     Extraocular Movements: Extraocular movements intact.     Conjunctiva/sclera: Conjunctivae normal.     Pupils: Pupils are equal, round, and reactive to light.  Cardiovascular:     Rate and Rhythm: Bradycardia present.     Pulses: Normal pulses.     Heart sounds: Normal heart sounds.  Pulmonary:     Effort: Pulmonary effort is normal.     Breath sounds: Normal breath sounds.  Musculoskeletal:        General: Normal range of motion.     Cervical back: Normal range of motion and neck supple.  Neurological:     General: No focal deficit present.     Mental Status: He is alert and oriented to person, place, and time.  Psychiatric:        Mood and Affect: Mood normal.        Behavior: Behavior normal.       10/24/2023   10:05 AM 10/21/2022    9:31 AM 09/05/2017    2:40 PM 08/27/2017    9:01 AM 11/24/2014    7:01 PM  Advanced Directives  Does Patient Have a Medical Advance Directive? No Yes  No No No  Does patient want to make changes to medical advance directive?  Yes (Inpatient - patient defers changing a medical advance directive at this time - Information given)     Would patient like information on creating a medical advance directive? Yes (MAU/Ambulatory/Procedural Areas - Information given)  No - Patient declined No - Patient declined Yes - Educational materials given    Current Medications (verified) Outpatient Encounter Medications as of 10/24/2023  Medication Sig   aspirin 81 MG tablet Take 81 mg by mouth daily.   atorvastatin (LIPITOR) 20 MG tablet Take 1 tablet (20 mg total) by mouth daily.   Blood Glucose Monitoring Suppl (ONE TOUCH ULTRA 2) w/Device KIT Use 4 (four) times daily -  before meals and at bedtime.   Blood Glucose Monitoring Suppl (ONE TOUCH ULTRA 2) w/Device KIT Use 4 (four) times daily -  before meals and at bedtime.   diltiazem (CARDIZEM CD) 240 MG 24 hr capsule Take 1 capsule (240 mg total) by mouth daily.   ENTRESTO 49-51 MG Take 1 tablet by mouth 2 (two) times daily.   glipiZIDE (GLIPIZIDE XL) 2.5 MG 24 hr tablet Take 1 tablet (2.5  mg total) by mouth daily.   glucose blood (ONETOUCH ULTRA) test strip Check blood sugars 4 times a day before and after meals   Magnesium 400 MG CAPS Take 400 mg by mouth 2 (two) times daily.   metFORMIN (GLUCOPHAGE) 1000 MG tablet Take 0.5 tablets (500 mg total) by mouth daily with breakfast.   Multiple Vitamin (ONE-A-DAY MENS PO) Take 1 tablet by mouth daily.   OneTouch Delica Lancets 33G MISC Check blood sugars 4 times a day before and after meals   sildenafil (VIAGRA) 100 MG tablet Take 1 tablet (100 mg total) by mouth daily as needed for erectile dysfunction.   spironolactone (ALDACTONE) 25 MG tablet Take 1 tablet (25 mg total) by mouth daily.   tadalafil (CIALIS) 5 MG tablet Take 5 mg by mouth daily.   [DISCONTINUED] tadalafil (CIALIS) 20 MG tablet Take 20 mg by mouth daily as needed.   No facility-administered  encounter medications on file as of 10/24/2023.    Allergies (verified) Ace inhibitors, Amlodipine, and Lisinopril   History: Past Medical History:  Diagnosis Date   Anxiety    Chronic combined systolic and diastolic congestive heart failure, NYHA class 2 (HCC) 09/2017   Has been maintained on excellent medications: Carvedilol 12.5 mg BID, Entresto 49 -51 mg p.o. BID, empagliflozin 10 mg daily, spironolactone 25 mg daily.   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  Mild RV dilation.  Mild MR.  Catheterization showed EF 25% normal EDP and normal coronaries (has been followed by Dr. Sharyn Lull)   Obesity, Class II, BMI 35-39.9, with comorbidity    Obesity, diabetes, and hypertension syndrome (HCC)    Substance abuse (HCC)    Past Surgical History:  Procedure Laterality Date   LEFT HEART CATH AND CORONARY ANGIOGRAPHY N/A 09/09/2017   Procedure: LEFT HEART CATH AND CORONARY ANGIOGRAPHY;  Surgeon: Rinaldo Cloud, MD;  Location: [facility];  Service: Cardiovascular; EF 15 to 20%.  Diffuse HK.  Normal LVEDP.  Normal coronaries.   NM  EXERCISE MYOVIEW LTD  06/02/2013   Good exercise capacity.  Hypertensive response to exercise.  Dyspneic on exercise but no significant ST changes to suggest ischemia.  Normal stress images.  No ischemia or infarction   TRANSTHORACIC ECHOCARDIOGRAM  09/06/2017   EF 15 to 20%.  Diffuse HK.  Severe LA dilation.  Mild RV dilation.  Mild MR.   Family History  Problem Relation Age of Onset   Stroke Father    Diabetes Father    Hypertension Father    Cancer Sister    Cerebral aneurysm Mother        Died at a young age.   Diabetes Paternal Grandmother    Hypertension Paternal Grandmother    Stroke Paternal Grandmother    Social History   Socioeconomic History    Marital status: Married    Spouse name: Not on file   Number of children: Not on file   Years of education: Not on file   Highest education level: Not on file  Occupational History   Not on file  Tobacco Use   Smoking status: Former    Current packs/day: 0.00    Average packs/day: 0.5 packs/day for 10.0 years (5.0 ttl pk-yrs)    Types: Cigarettes    Start date:  10/03/1973    Quit date: 10/04/1983    Years since quitting: 40.0    Passive exposure: Past   Smokeless tobacco: Never  Vaping Use   Vaping status: Never Used  Substance and Sexual Activity   Alcohol use: Yes    Alcohol/week: 6.0 standard drinks of alcohol    Types: 6 Standard drinks or equivalent per week   Drug use: No   Sexual activity: Yes  Other Topics Concern   Not on file  Social History Narrative   His married father of 4.  His married for 28 years.  He lives with his wife and daughters.  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  Education: [employer].   He quit smoking in 1985.  He drinks 3 beers a week.   Prior to the onset of his current symptoms, used to work out with weights for at least 40 minutes a day for 3 times a week.  He usually did light weights for many occasions as opposed to heavy weights.      Contacts: Wife [name]; daughter [name]   Social Drivers of Health   Financial Resource Strain: Low Risk  (08/04/2023)   Overall Financial Resource Strain (CARDIA)    Difficulty of Paying Living Expenses: Not hard at all  Food Insecurity: No Food Insecurity (08/04/2023)   Hunger Vital Sign    Worried About Running Out of Food in the Last Year: Never true    Ran Out of Food in the Last Year: Never true  Transportation Needs: No Transportation Needs (08/04/2023)   PRAPARE - Administrator, Civil Service (Medical): No    Lack of Transportation (Non-Medical): No  Physical Activity: Not on file  Stress: No Stress Concern Present (08/04/2023)    [employer] of Occupational Health - Occupational Stress Questionnaire    Feeling of Stress : Not at all  Social Connections: Not on file    Tobacco Counseling Reports he did not smoke in the past or presently.  Clinical Intake:   Activities of Daily Living    10/24/2023   10:04 AM  In your present state of health, do you have any difficulty performing the following activities:  Hearing? 0  Vision? 0  Difficulty concentrating or making decisions? 0  Walking or climbing stairs? 0  Dressing or bathing? 0  Doing errands, shopping? 0  Preparing Food and eating ? N  Using the Toilet? N  In the past six months, have you accidently leaked urine? N  Do you have problems with loss of bowel control? N  Managing your Medications? N  Managing your Finances? N  Housekeeping or managing your Housekeeping? N    Patient Care Team: Rema Fendt, NP as PCP - General (Nurse Practitioner) Marykay Lex, MD as PCP - Cardiology (Cardiology) Mealor, Roberts Gaudy, MD as PCP - Electrophysiology (Cardiology) Jeani Hawking, MD as Consulting Physician (Gastroenterology) Marykay Lex, MD as Consulting Physician (Cardiology) Alfredo Martinez, MD as Consulting Physician (Urology)  Indicate any recent Medical Services you may have received from other than [facility] providers in the past year (date may be approximate).  Assessment:  This is a routine wellness examination for [place].  Hearing/Vision screen - Hearing normal. - Established with Ophthalmology.  Goals Addressed: Maintaining weight loss and continuing to exercise.  Depression Screen    10/24/2023   10:07 AM 10/21/2022    9:31 AM 07/29/2022    9:22 AM 04/29/2022  9:39 AM 08/08/2021   10:22 AM 06/08/2021   10:40 AM 03/29/2021    2:15 PM  PHQ 2/9 Scores  PHQ - 2 Score 0 0 0 0 0 0 0  PHQ- 9 Score       0    Fall Risk    10/24/2023   10:06 AM 10/01/2023   10:39 AM 09/01/2023    9:45 AM 08/04/2023    9:54 AM 10/21/2022     9:31 AM  Fall Risk   Falls in the past year? 0 0 0 0 0  Number falls in past yr: 0 0 0 0 0  Injury with Fall? 0 0 0 0 0  Risk for fall due to : No Fall Risks No Fall Risks No Fall Risks No Fall Risks No Fall Risks  Follow up  Falls evaluation completed   Falls evaluation completed    MEDICARE RISK AT HOME: Medicare Risk at Home Any stairs in or around the home?: Yes If so, are there any without handrails?: No Home free of loose throw rugs in walkways, pet beds, electrical cords, etc?: No Adequate lighting in your home to reduce risk of falls?: Yes Life alert?: No Use of a cane, walker or w/c?: No Grab bars in the bathroom?: No Shower chair or bench in shower?: No Elevated toilet seat or a handicapped toilet?: No  TIMED UP AND GO:  Was the test performed?  Yes  Length of time to ambulate 10 feet: 10 sec Gait slow and steady without use of assistive device    Cognitive Function:    10/24/2023   10:07 AM 10/21/2022    9:32 AM  MMSE - Mini Mental State Exam  Orientation to time 5 5  Orientation to Place 5 5  Registration 3 3  Attention/ Calculation 5 5  Recall 3 3  Language- name 2 objects 2 2  Language- repeat 1 1  Language- follow 3 step command 3 3  Language- read & follow direction 1 1  Write a sentence 1 1  Copy design 1 1  Total score 30 30        10/24/2023   10:07 AM 10/21/2022    9:32 AM  6CIT Screen  What Year? 0 points 0 points  What month? 0 points 0 points  What time? 0 points 0 points  Count back from 20 0 points 0 points  Months in reverse 0 points 0 points  Repeat phrase 0 points 0 points  Total Score 0 points 0 points    Immunizations Immunization History  Administered Date(s) Administered   Fluad Quad(high Dose 65+) 07/29/2022   Hepatitis B 09/02/2005   Influenza,inj,Quad PF,6+ Mos 05/28/2013, 06/03/2014, 06/29/2015, 06/08/2021   Influenza-Unspecified 08/18/2020   PFIZER(Purple Top)SARS-COV-2 Vaccination 09/23/2019, 10/14/2019, 08/18/2020    PNEUMOCOCCAL CONJUGATE-20 06/08/2021   Pneumococcal Polysaccharide-23 05/28/2013   Tdap 02/25/2013    TDAP status: Due, Education has been provided regarding the importance of this vaccine. Advised may receive this vaccine at local pharmacy or Health Dept. Aware to provide a copy of the vaccination record if obtained from local pharmacy or Health Dept. Verbalized acceptance and understanding.  Flu Vaccine status: Up to date  Pneumococcal vaccine status: Up to date  Covid-19 vaccine status: Information provided on how to obtain vaccines.   Qualifies for Shingles Vaccine? Yes   Zostavax completed Yes   Shingrix Completed?: Yes  Screening Tests Health Maintenance  Topic Date Due   OPHTHALMOLOGY EXAM  08/15/2022   Colonoscopy  07/02/2023   Diabetic kidney evaluation - Urine ACR  10/22/2023   Zoster Vaccines- Shingrix (1 of 2) 11/30/2023 (Originally 05/11/2006)   INFLUENZA VACCINE  12/01/2023 (Originally 04/03/2023)   HEMOGLOBIN A1C  03/30/2024   Diabetic kidney evaluation - eGFR measurement  04/30/2024   FOOT EXAM  08/19/2024   Medicare Annual Wellness (AWV)  10/23/2024   Pneumonia Vaccine 16+ Years old  Completed   Hepatitis C Screening  Completed   HPV VACCINES  Aged Out   DTaP/Tdap/Td  Discontinued   COVID-19 Vaccine  Discontinued    Health Maintenance  Health Maintenance Due  Topic Date Due   OPHTHALMOLOGY EXAM  08/15/2022   Colonoscopy  07/02/2023   Diabetic kidney evaluation - Urine ACR  10/22/2023    Colorectal cancer screening: Referral to GI placed 10/24/2023. Pt aware the office will call re: appt.  Lung Cancer Screening: (Low Dose CT Chest recommended if Age 101-80 years, 20 pack-year currently smoking OR have quit w/in 15years.) does not qualify.   Lung Cancer Screening Referral: N/A  Additional Screening:  Hepatitis C Screening: does not qualify; Completed 2023  Vision Screening: Recommended annual ophthalmology exams for early detection of glaucoma and  other disorders of the eye. Is the patient up to date with their annual eye exam?  Yes Who is the provider or what is the name of the office in which the patient attends annual eye exams? Dr. Shea Evans If pt is not established with a provider, would they like to be referred to a provider to establish care? N/A  Dental Screening: Recommended annual dental exams for proper oral hygiene  Diabetic Foot Exam: Diabetic Foot Exam: Completed 12/18/ 2024  Community Resource Referral / Chronic Care Management: CRR required this visit?  No   CCM required this visit?  No  Plan:  1. Medicare annual wellness visit, subsequent (Primary) - Counseled on 150 minutes of exercise per week as tolerated, healthy eating (including decreased daily intake of saturated fats, cholesterol, added sugars, sodium), STI prevention, and routine healthcare maintenance.  2. Type 2 diabetes mellitus with hyperglycemia, without long-term current use of insulin (HCC) - Continue Glipizide as prescribed. Counseled on medication adherence/adverse effects.  - Hemoglobin A1c result pending.  - Routine screening.  - Discussed the importance of healthy eating habits, low-carbohydrate diet, low-sugar diet, regular aerobic exercise (at least 150 minutes a week as tolerated) and medication compliance to achieve or maintain control of diabetes. - Follow-up with primary provider as scheduled. - Microalbumin / creatinine urine ratio - POCT glycosylated hemoglobin (Hb A1C)  3. Colon cancer screening - Referral to Gastroenterology for evaluation/management. - Ambulatory referral to Gastroenterology   I have personally reviewed and noted the following in the patient's chart:   Medical and social history Use of alcohol, tobacco or illicit drugs  Current medications and supplements including opioid prescriptions. Patient is not currently taking opioid prescriptions. Functional ability and status Nutritional status Physical  activity Advanced directives List of other physicians Hospitalizations, surgeries, and ER visits in previous 12 months Vitals Screenings to include cognitive, depression, and falls Referrals and appointments  In addition, I have reviewed and discussed with patient certain preventive protocols, quality metrics, and best practice recommendations. A written personalized care plan for preventive services as well as general preventive health recommendations were provided to patient.   Rema Fendt, NP   10/24/2023

## 2023-10-24 NOTE — Progress Notes (Signed)
 Subjective:   Todd Greer is a 68 y.o. male who presents for Medicare Annual/Subsequent preventive examination.  Visit Complete: In person  Patient Medicare AWV questionnaire was completed by the patient in office; I have confirmed that all information answered by patient is correct and no changes since this date.        Objective:    Today's Vitals   10/24/23 1001 10/24/23 1002  BP: (!) 145/89 (!) 154/81  Pulse:  (!) 41  Resp:  16  Temp:  98 F (36.7 C)  TempSrc:  Oral  SpO2:  99%  Weight:  163 lb (73.9 kg)  Height:  5\' 7"  (1.702 m)   Body mass index is 25.53 kg/m.     10/24/2023   10:05 AM 10/21/2022    9:31 AM 09/05/2017    2:40 PM 08/27/2017    9:01 AM 11/24/2014    7:01 PM  Advanced Directives  Does Patient Have a Medical Advance Directive? No Yes No No No  Does patient want to make changes to medical advance directive?  Yes (Inpatient - patient defers changing a medical advance directive at this time - Information given)     Would patient like information on creating a medical advance directive? Yes (MAU/Ambulatory/Procedural Areas - Information given)  No - Patient declined No - Patient declined Yes - Educational materials given    Current Medications (verified) Outpatient Encounter Medications as of 10/24/2023  Medication Sig   aspirin 81 MG tablet Take 81 mg by mouth daily.   atorvastatin (LIPITOR) 20 MG tablet Take 1 tablet (20 mg total) by mouth daily.   Blood Glucose Monitoring Suppl (ONE TOUCH ULTRA 2) w/Device KIT Use 4 (four) times daily -  before meals and at bedtime.   Blood Glucose Monitoring Suppl (ONE TOUCH ULTRA 2) w/Device KIT Use 4 (four) times daily -  before meals and at bedtime.   diltiazem (CARDIZEM CD) 240 MG 24 hr capsule Take 1 capsule (240 mg total) by mouth daily.   ENTRESTO 49-51 MG Take 1 tablet by mouth 2 (two) times daily.   glipiZIDE (GLIPIZIDE XL) 2.5 MG 24 hr tablet Take 1 tablet (2.5 mg total) by mouth daily.   glucose blood  (ONETOUCH ULTRA) test strip Check blood sugars 4 times a day before and after meals   Magnesium 400 MG CAPS Take 400 mg by mouth 2 (two) times daily.   metFORMIN (GLUCOPHAGE) 1000 MG tablet Take 0.5 tablets (500 mg total) by mouth daily with breakfast.   Multiple Vitamin (ONE-A-DAY MENS PO) Take 1 tablet by mouth daily.   OneTouch Delica Lancets 33G MISC Check blood sugars 4 times a day before and after meals   sildenafil (VIAGRA) 100 MG tablet Take 1 tablet (100 mg total) by mouth daily as needed for erectile dysfunction.   spironolactone (ALDACTONE) 25 MG tablet Take 1 tablet (25 mg total) by mouth daily.   tadalafil (CIALIS) 5 MG tablet Take 5 mg by mouth daily.   [DISCONTINUED] tadalafil (CIALIS) 20 MG tablet Take 20 mg by mouth daily as needed.   No facility-administered encounter medications on file as of 10/24/2023.    Allergies (verified) Ace inhibitors, Amlodipine, and Lisinopril   History: Past Medical History:  Diagnosis Date   Anxiety    Chronic combined systolic and diastolic congestive heart failure, NYHA class 2 (HCC) 09/2017   Has been maintained on excellent medications: Carvedilol 12.5 mg BID, Entresto 49 -51 mg p.o. BID, empagliflozin 10 mg daily, spironolactone 25  mg daily.   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  Mild RV dilation.  Mild MR.  Catheterization showed EF 25% normal EDP and normal coronaries (has been followed by Dr. Sharyn Lull)   Obesity, Class II, BMI 35-39.9, with comorbidity    Obesity, diabetes, and hypertension syndrome (HCC)    Substance abuse (HCC)    Past Surgical History:  Procedure Laterality Date   LEFT HEART CATH AND CORONARY ANGIOGRAPHY N/A 09/09/2017   Procedure: LEFT HEART CATH AND CORONARY ANGIOGRAPHY;  Surgeon: Rinaldo Cloud, MD;   Location: [facility];  Service: Cardiovascular; EF 15 to 20%.  Diffuse HK.  Normal LVEDP.  Normal coronaries.   NM  EXERCISE MYOVIEW LTD  06/02/2013   Good exercise capacity.  Hypertensive response to exercise.  Dyspneic on exercise but no significant ST changes to suggest ischemia.  Normal stress images.  No ischemia or infarction   TRANSTHORACIC ECHOCARDIOGRAM  09/06/2017   EF 15 to 20%.  Diffuse HK.  Severe LA dilation.  Mild RV dilation.  Mild MR.   Family History  Problem Relation Age of Onset   Stroke Father    Diabetes Father    Hypertension Father    Cancer Sister    Cerebral aneurysm Mother        Died at a young age.   Diabetes Paternal Grandmother    Hypertension Paternal Grandmother    Stroke Paternal Grandmother    Social History   Socioeconomic History   Marital status: Married    Spouse name: Not on file   Number of children: Not on file   Years of education: Not on file   Highest education level: Not on file  Occupational History   Not on file  Tobacco Use   Smoking status: Former    Current packs/day: 0.00    Average packs/day: 0.5 packs/day for 10.0 years (5.0 ttl pk-yrs)    Types: Cigarettes    Start date: 10/03/1973    Quit date: 10/04/1983    Years since quitting: 40.0    Passive exposure: Past   Smokeless tobacco: Never  Vaping Use   Vaping status: Never Used  Substance and Sexual Activity   Alcohol use: Yes    Alcohol/week: 6.0 standard drinks of alcohol    Types: 6 Standard drinks or equivalent per week   Drug use: No   Sexual activity: Yes  Other Topics Concern   Not on file  Social History Narrative   His married father of 4.  His married for 28 years.  He lives with his wife and daughters.  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  Education: [employer].   He quit smoking in 1985.  He drinks 3 beers a week.   Prior to the onset of his current symptoms, used to work out with weights for at least  40 minutes a day for 3 times a week.  He usually did light weights for many occasions as opposed to heavy weights.      Contacts: Wife [name]; daughter [name]   Social Drivers of Health   Financial Resource Strain: Low Risk  (08/04/2023)   Overall Financial Resource Strain (CARDIA)    Difficulty of Paying Living Expenses: Not hard at all  Food Insecurity: No Food Insecurity (08/04/2023)   Hunger Vital Sign    Worried About Running Out of Food in the Last Year: Never true    Ran Out of Food in the Last Year: Never true  Transportation Needs: No Transportation Needs (08/04/2023)   PRAPARE - Administrator, Civil Service (Medical): No    Lack of Transportation (Non-Medical): No  Physical Activity: Not on file  Stress: No Stress Concern Present (08/04/2023)   [employer] of Occupational Health - Occupational Stress Questionnaire    Feeling of Stress : Not at all  Social Connections: Not on file    Tobacco Counseling Counseling given: Not Answered   Clinical Intake:   Nutrition Risk Assessment:  Has the patient had any N/V/D within the last 2 months?  No  Does the patient have any non-healing wounds?  No  Has the patient had any unintentional weight loss or weight gain?  No   Diabetes:  Is the patient diabetic?  Yes  If diabetic, was a CBG obtained today?  No  Did the patient bring in their glucometer from home?  No  How often do you monitor your CBG's? .   Financial Strains and Diabetes Management:  Are you having any financial strains with the device, your supplies or your medication? No .  Does the patient want to be seen by Chronic Care Management for management of their diabetes?  No  Would the patient like to be referred to a Nutritionist or for Diabetic Management?  No   Diabetic Exams:  Diabetic Eye Exam: Patient had eye exam on 08/20/2023, results pending. Diabetic Foot Exam: Completed 08/20/2023                        Activities of Daily Living    10/24/2023   10:04 AM  In your present state of health, do you have any difficulty performing the following activities:  Hearing? 0  Vision? 0  Difficulty concentrating or making decisions? 0  Walking or climbing stairs? 0  Dressing or bathing? 0  Doing errands, shopping? 0  Preparing Food and eating ? N  Using the Toilet? N  In the past six months, have you accidently leaked urine? N  Do you have problems with loss of bowel control? N  Managing your Medications? N  Managing your Finances? N  Housekeeping or managing your Housekeeping? N    Patient Care Team: Rema Fendt, NP as PCP - General (Nurse Practitioner) Marykay Lex, MD as PCP - Cardiology (Cardiology) Mealor, Roberts Gaudy, MD as PCP - Electrophysiology (Cardiology) Jeani Hawking, MD as Consulting Physician (Gastroenterology) Marykay Lex, MD as Consulting Physician (Cardiology) Alfredo Martinez, MD as Consulting Physician (Urology)  Indicate any recent Medical Services you may have received from other than [facility] providers in the past year (date may be approximate).     Assessment:   This is a routine wellness examination for Todd Greer.  Hearing/Vision screen No results found.   Goals Addressed   None   Depression Screen    10/24/2023   10:07 AM 10/21/2022    9:31 AM 07/29/2022    9:22 AM 04/29/2022    9:39 AM 08/08/2021   10:22 AM 06/08/2021   10:40 AM 03/29/2021    2:15 PM  PHQ 2/9 Scores  PHQ - 2 Score 0 0 0 0 0 0 0  PHQ- 9 Score       0  Fall Risk    10/24/2023   10:06 AM 10/01/2023   10:39 AM 09/01/2023    9:45 AM 08/04/2023    9:54 AM 10/21/2022    9:31 AM  Fall Risk   Falls in the past year? 0 0 0 0 0  Number falls in past yr: 0 0 0 0 0  Injury with Fall? 0 0 0 0 0  Risk for fall due to : No Fall Risks No Fall Risks No Fall Risks No Fall Risks No Fall Risks  Follow up  Falls evaluation completed   Falls evaluation completed    MEDICARE RISK AT  HOME: Medicare Risk at Home Any stairs in or around the home?: Yes If so, are there any without handrails?: No Home free of loose throw rugs in walkways, pet beds, electrical cords, etc?: No Adequate lighting in your home to reduce risk of falls?: Yes Life alert?: No Use of a cane, walker or w/c?: No Grab bars in the bathroom?: No Shower chair or bench in shower?: No Elevated toilet seat or a handicapped toilet?: No  TIMED UP AND GO:  Was the test performed?  Yes  Length of time to ambulate 10 feet: 10 sec Gait slow and steady without use of assistive device    Cognitive Function:    10/24/2023   10:07 AM 10/21/2022    9:32 AM  MMSE - Mini Mental State Exam  Orientation to time 5 5  Orientation to Place 5 5  Registration 3 3  Attention/ Calculation 5 5  Recall 3 3  Language- name 2 objects 2 2  Language- repeat 1 1  Language- follow 3 step command 3 3  Language- read & follow direction 1 1  Write a sentence 1 1  Copy design 1 1  Total score 30 30        10/24/2023   10:07 AM 10/21/2022    9:32 AM  6CIT Screen  What Year? 0 points 0 points  What month? 0 points 0 points  What time? 0 points 0 points  Count back from 20 0 points 0 points  Months in reverse 0 points 0 points  Repeat phrase 0 points 0 points  Total Score 0 points 0 points    Immunizations Immunization History  Administered Date(s) Administered   Fluad Quad(high Dose 65+) 07/29/2022   Hepatitis B 09/02/2005   Influenza,inj,Quad PF,6+ Mos 05/28/2013, 06/03/2014, 06/29/2015, 06/08/2021   Influenza-Unspecified 08/18/2020   PFIZER(Purple Top)SARS-COV-2 Vaccination 09/23/2019, 10/14/2019, 08/18/2020   PNEUMOCOCCAL CONJUGATE-20 06/08/2021   Pneumococcal Polysaccharide-23 05/28/2013   Tdap 02/25/2013    TDAP status: Due, Education has been provided regarding the importance of this vaccine. Advised may receive this vaccine at local pharmacy or Health Dept. Aware to provide a copy of the vaccination  record if obtained from local pharmacy or Health Dept. Verbalized acceptance and understanding.  Flu Vaccine status: Up to date  Pneumococcal vaccine status: Up to date  Covid-19 vaccine status: Information provided on how to obtain vaccines.   Qualifies for Shingles Vaccine? Yes   Zostavax completed Yes   Shingrix Completed?: Yes  Screening Tests Health Maintenance  Topic Date Due   OPHTHALMOLOGY EXAM  08/15/2022   Colonoscopy  07/02/2023   Diabetic kidney evaluation - Urine ACR  10/22/2023   Zoster Vaccines- Shingrix (1 of 2) 11/30/2023 (Originally 05/11/2006)   INFLUENZA VACCINE  12/01/2023 (Originally 04/03/2023)   HEMOGLOBIN A1C  03/30/2024   Diabetic kidney evaluation - eGFR measurement  04/30/2024  FOOT EXAM  08/19/2024   Medicare Annual Wellness (AWV)  10/23/2024   Pneumonia Vaccine 10+ Years old  Completed   Hepatitis C Screening  Completed   HPV VACCINES  Aged Out   DTaP/Tdap/Td  Discontinued   COVID-19 Vaccine  Discontinued    Health Maintenance  Health Maintenance Due  Topic Date Due   OPHTHALMOLOGY EXAM  08/15/2022   Colonoscopy  07/02/2023   Diabetic kidney evaluation - Urine ACR  10/22/2023    Colorectal cancer screening: Referral to GI placed  . Pt aware the office will call re: appt.  Lung Cancer Screening: (Low Dose CT Chest recommended if Age 82-80 years, 20 pack-year currently smoking OR have quit w/in 15years.) does not qualify.   Lung Cancer Screening Referral: n/a  Additional Screening:  Hepatitis C Screening: does not qualify; Completed 2023  Vision Screening: Recommended annual ophthalmology exams for early detection of glaucoma and other disorders of the eye. Is the patient up to date with their annual eye exam?  No  Who is the provider or what is the name of the office in which the patient attends annual eye exams? Dr. Shea Evans If pt is not established with a provider, would they like to be referred to a provider to establish care? No .    Dental Screening: Recommended annual dental exams for proper oral hygiene  Diabetic Foot Exam: Diabetic Foot Exam: Completed 12/18/ 2024  Community Resource Referral / Chronic Care Management: CRR required this visit?  No   CCM required this visit?  No     Plan:     I have personally reviewed and noted the following in the patient's chart:   Medical and social history Use of alcohol, tobacco or illicit drugs  Current medications and supplements including opioid prescriptions. Patient is not currently taking opioid prescriptions. Functional ability and status Nutritional status Physical activity Advanced directives List of other physicians Hospitalizations, surgeries, and ER visits in previous 12 months Vitals Screenings to include cognitive, depression, and falls Referrals and appointments  In addition, I have reviewed and discussed with patient certain preventive protocols, quality metrics, and best practice recommendations. A written personalized care plan for preventive services as well as general preventive health recommendations were provided to patient.     Kieth Brightly, RMA   10/24/2023   After Visit Summary: (In Person-Declined) Patient declined AVS at this time.  Nurse Notes:

## 2023-10-26 LAB — MICROALBUMIN / CREATININE URINE RATIO
Creatinine, Urine: 58.6 mg/dL
Microalb/Creat Ratio: 60 mg/g{creat} — ABNORMAL HIGH (ref 0–29)
Microalbumin, Urine: 35.2 ug/mL

## 2023-10-27 ENCOUNTER — Encounter: Payer: Self-pay | Admitting: Family

## 2023-11-03 ENCOUNTER — Encounter: Payer: Self-pay | Admitting: Family

## 2023-11-03 ENCOUNTER — Ambulatory Visit (INDEPENDENT_AMBULATORY_CARE_PROVIDER_SITE_OTHER): Payer: Medicare Other | Admitting: Family

## 2023-11-03 ENCOUNTER — Other Ambulatory Visit: Payer: Self-pay

## 2023-11-03 ENCOUNTER — Other Ambulatory Visit: Payer: Self-pay | Admitting: Family

## 2023-11-03 VITALS — BP 104/68 | HR 54 | Temp 98.2°F | Ht 67.0 in | Wt 158.2 lb

## 2023-11-03 DIAGNOSIS — E1165 Type 2 diabetes mellitus with hyperglycemia: Secondary | ICD-10-CM

## 2023-11-03 DIAGNOSIS — Z7984 Long term (current) use of oral hypoglycemic drugs: Secondary | ICD-10-CM | POA: Diagnosis not present

## 2023-11-03 DIAGNOSIS — E119 Type 2 diabetes mellitus without complications: Secondary | ICD-10-CM

## 2023-11-03 LAB — POCT GLYCOSYLATED HEMOGLOBIN (HGB A1C): HbA1c, POC (controlled diabetic range): 6 % (ref 0.0–7.0)

## 2023-11-03 MED ORDER — GLIPIZIDE ER 2.5 MG PO TB24
2.5000 mg | ORAL_TABLET | Freq: Every day | ORAL | 1 refills | Status: DC
Start: 2023-11-03 — End: 2024-01-06
  Filled 2023-11-03: qty 30, 30d supply, fill #0
  Filled 2023-12-22: qty 30, 30d supply, fill #1

## 2023-11-03 NOTE — Progress Notes (Addendum)
 Patient ID: Todd Greer, male    DOB: 02-Feb-1956  MRN: [id number]  CC: Chronic Conditions Follow-Up  Subjective: Todd Greer is a 68 y.o. male who presents for chronic conditions follow-up.  His concerns today include:  - Doing well on Glipizide 2.5 mg daily, no issues/concerns. Denies red flag symptoms associated with diabetes.  - I discussed with patient in detail elevated microalbumin creatinine urine ratio usually managed with ACE/ARB blood pressure medications. Due to patient established with Cardiology for chronic conditions management will defer to the same. Patient aware to follow-up with primary provider as scheduled once discussed with Cardiology. Patient verbalized understanding/agreement.  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     atorvastatin (LIPITOR) 20 MG tablet Take 1 tablet (20 mg total) by mouth daily.  90 tablet 0   Blood Glucose Monitoring Suppl (ONE TOUCH ULTRA 2) w/Device KIT Use 4 (four) times daily -  before meals and at bedtime. 1 kit 0   Blood Glucose Monitoring Suppl (ONE TOUCH ULTRA 2) w/Device KIT Use 4 (four) times daily -  before meals and at bedtime. 1 kit 0   diltiazem (CARDIZEM CD) 240 MG 24 hr capsule Take 1 capsule (240 mg total) by mouth daily. 90 capsule 3   ENTRESTO 49-51 MG Take 1 tablet by mouth 2 (two) times daily. 180 tablet 2   glipiZIDE (GLIPIZIDE XL) 2.5 MG 24 hr tablet Take 1 tablet (2.5 mg total) by mouth daily. 30 tablet 1   glucose blood (ONETOUCH ULTRA) test strip Check blood sugars 4 times a day before and after meals 100 each 12   Magnesium 400 MG CAPS Take 400 mg by mouth 2 (two) times daily. 90 capsule 3   metFORMIN (GLUCOPHAGE) 1000 MG tablet Take 0.5 tablets (500 mg total) by mouth daily with breakfast. 30 tablet 1   Multiple Vitamin (ONE-A-DAY MENS PO) Take 1 tablet by mouth daily.     OneTouch Delica Lancets 33G MISC Check blood sugars 4 times a day before and after meals 100 each 12   sildenafil (VIAGRA) 100 MG tablet Take 1 tablet (100 mg total) by mouth daily as needed for erectile dysfunction. 8 tablet 11   spironolactone (ALDACTONE) 25 MG tablet Take 1 tablet (25 mg total) by mouth daily. 90 tablet 0   tadalafil (CIALIS) 5 MG tablet Take 5 mg by mouth daily.     No current facility-administered medications on file prior to visit.  Allergies  Allergen Reactions   Ace Inhibitors Cough    Tolerates ARB   Amlodipine Other (See Comments)    Fatigue   Lisinopril     Social History   Socioeconomic History   Marital status: Married    Spouse name: Not on file   Number of children: Not on file   Years of education: Not on file   Highest education level: Not on file  Occupational History   Not on file  Tobacco Use   Smoking status: Former    Current packs/day: 0.00    Average packs/day: 0.5 packs/day for 10.0 years (5.0 ttl pk-yrs)     Types: Cigarettes    Start date: 10/03/1973    Quit date: 10/04/1983    Years since quitting: 40.1    Passive exposure: Past   Smokeless tobacco: Never  Vaping Use   Vaping status: Never Used  Substance and Sexual Activity   Alcohol use: Yes    Alcohol/week: 6.0 standard drinks of alcohol    Types: 6 Standard drinks or equivalent per week   Drug use: No   Sexual activity: Yes  Other Topics Concern   Not on file  Social History Narrative   His married father of 4.  His married for 28 years.  He lives with his wife and daughters.  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  Education: [employer].   He quit smoking in 1985.  He drinks 3 beers a week.   Prior to the onset of his current symptoms, used to work out with weights for at least 40 minutes a day for 3 times a week.  He usually did light weights for many occasions as opposed to heavy weights.      Contacts: Wife [name]; daughter [name]   Social Drivers of Health   Financial Resource Strain: Low Risk  (08/04/2023)   Overall Financial Resource Strain (CARDIA)    Difficulty of Paying Living Expenses: Not hard at all  Food Insecurity: No Food Insecurity (08/04/2023)   Hunger Vital Sign    Worried About Running Out of Food in the Last Year: Never true    Ran Out of Food in the Last Year: Never true  Transportation Needs: No Transportation Needs (08/04/2023)   PRAPARE - Administrator, Civil Service (Medical): No    Lack of Transportation (Non-Medical): No  Physical Activity: Not on file  Stress: No Stress Concern Present (08/04/2023)   [employer] of Occupational Health - Occupational Stress Questionnaire    Feeling of Stress : Not at all  Social Connections: Not on file  Intimate Partner Violence: Not At Risk (08/04/2023)   Humiliation, Afraid, Rape, and Kick questionnaire    Fear of Current or Ex-Partner: No    Emotionally Abused: No    Physically Abused: No     Sexually Abused: No    Family History  Problem Relation Age of Onset   Stroke Father    Diabetes Father    Hypertension Father    Cancer Sister    Cerebral aneurysm Mother        Died at a young age.   Diabetes Paternal Grandmother    Hypertension Paternal Grandmother    Stroke Paternal Grandmother     Past Surgical History:  Procedure Laterality Date   LEFT HEART CATH AND CORONARY ANGIOGRAPHY N/A 09/09/2017   Procedure: LEFT HEART CATH AND CORONARY ANGIOGRAPHY;  Surgeon: Rinaldo Cloud, MD;  Location: [facility];  Service: Cardiovascular; EF 15 to 20%.  Diffuse HK.  Normal LVEDP.  Normal coronaries.   NM  EXERCISE MYOVIEW LTD  06/02/2013   Good exercise capacity.  Hypertensive response to exercise.  Dyspneic on exercise but no significant ST changes to suggest ischemia.  Normal stress images.  No ischemia or infarction   TRANSTHORACIC ECHOCARDIOGRAM  09/06/2017   EF 15 to 20%.  Diffuse HK.  Severe LA dilation.  Mild RV dilation.  Mild MR.    ROS: Review of Systems Negative except as stated above  PHYSICAL EXAM: BP 104/68   Pulse (!) 54   Temp 98.2 F (36.8 C) (Oral)   Ht 5\' 7"  (1.702 m)   Wt 158 lb 3.2 oz (71.8 kg)   SpO2 97%   BMI 24.78 kg/m   Physical Exam HENT:     Head: Normocephalic and atraumatic.     Nose: Nose normal.     Mouth/Throat:     Mouth: Mucous membranes are moist.     Pharynx: Oropharynx is clear.  Eyes:     Extraocular Movements: Extraocular movements intact.     Conjunctiva/sclera: Conjunctivae normal.     Pupils: Pupils are equal, round, and reactive to light.  Cardiovascular:     Rate and Rhythm: Bradycardia present.     Pulses: Normal pulses.     Heart sounds: Normal heart sounds.  Pulmonary:     Effort: Pulmonary effort is normal.     Breath sounds: Normal breath sounds.  Musculoskeletal:        General: Normal range of motion.     Cervical back: Normal range of motion and neck supple.  Neurological:     General: No focal  deficit present.     Mental Status: He is alert and oriented to person, place, and time.  Psychiatric:        Mood and Affect: Mood normal.        Behavior: Behavior normal.     ASSESSMENT AND PLAN: 1. Type 2 diabetes mellitus with hyperglycemia, without long-term current use of insulin (HCC) (Primary) - Patient currently taking Glipizide 2.5 mg daily. Counseled on medication adherence/adverse effects. - Hemoglobin A1c result pending.  - Discussed the importance of healthy eating habits, low-carbohydrate diet, low-sugar diet, regular aerobic exercise (at least 150 minutes a week as tolerated) and medication compliance to achieve or maintain control of diabetes. - Follow-up with primary provider as scheduled. - POCT glycosylated hemoglobin (Hb A1C)  2. Diabetic eye exam [facility]) - Referral to Ophthalmology for evaluation/management. - Ambulatory referral to Ophthalmology   Patient was given the opportunity to ask questions.  Patient verbalized understanding of the plan and was able to repeat key elements of the plan. Patient was given clear instructions to go to Emergency Department or return to medical center if symptoms don't improve, worsen, or new problems develop.The patient verbalized understanding.   Orders Placed This Encounter  Procedures   Ambulatory referral to Ophthalmology   POCT glycosylated hemoglobin (Hb A1C)    Follow-up with primary provider as scheduled.  Rema Fendt, NP

## 2023-11-03 NOTE — Progress Notes (Signed)
 ASPatient states nothing to really discuss.

## 2023-11-04 ENCOUNTER — Other Ambulatory Visit: Payer: Self-pay | Admitting: Family

## 2023-11-05 ENCOUNTER — Other Ambulatory Visit: Payer: Self-pay

## 2023-11-06 ENCOUNTER — Other Ambulatory Visit: Payer: Self-pay

## 2023-11-17 ENCOUNTER — Ambulatory Visit: Payer: Medicare Other | Admitting: Skilled Nursing Facility1

## 2023-11-25 DIAGNOSIS — H40013 Open angle with borderline findings, low risk, bilateral: Secondary | ICD-10-CM | POA: Diagnosis not present

## 2023-11-26 ENCOUNTER — Ambulatory Visit: Payer: Medicare Other | Admitting: Podiatry

## 2023-11-26 ENCOUNTER — Encounter: Payer: Self-pay | Admitting: Gastroenterology

## 2023-11-28 ENCOUNTER — Encounter: Payer: Self-pay | Admitting: Cardiovascular Disease

## 2023-11-28 ENCOUNTER — Ambulatory Visit: Payer: Medicare Other | Attending: Cardiovascular Disease | Admitting: Cardiovascular Disease

## 2023-11-28 VITALS — BP 124/84 | HR 64 | Ht 67.0 in | Wt 161.6 lb

## 2023-11-28 DIAGNOSIS — R001 Bradycardia, unspecified: Secondary | ICD-10-CM | POA: Diagnosis not present

## 2023-11-28 DIAGNOSIS — I493 Ventricular premature depolarization: Secondary | ICD-10-CM

## 2023-11-28 NOTE — Patient Instructions (Signed)
 Medication Instructions:  Your physician recommends that you continue on your current medications as directed. Please refer to the Current Medication list given to you today. *If you need a refill on your cardiac medications before your next appointment, please call your pharmacy*  Follow-Up: At [facility], you and your health needs are our priority.  As part of our continuing mission to provide you with exceptional heart care, our providers are all part of one team.  This team includes your primary Cardiologist (physician) and Advanced Practice Providers or APPs (Physician Assistants and Nurse Practitioners) who all work together to provide you with the care you need, when you need it.  Your next appointment:   Follow up on 01/14/24 with   Provider:   Dr. Herbie [place]  We recommend signing up for the patient portal called "MyChart".  Sign up information is provided on this After Visit Summary.  MyChart is used to connect with patients for Virtual Visits (Telemedicine).  Patients are able to view lab/test results, encounter notes, upcoming appointments, etc.  Non-urgent messages can be sent to your provider as well.   To learn more about what you can do with MyChart, go to [email or web address].        1st Floor: - Lobby - Registration  - Pharmacy  - Lab - Cafe  2nd Floor: - PV Lab - Diagnostic Testing (echo, CT, nuclear med)  3rd Floor: - Vacant  4th Floor: - TCTS (cardiothoracic surgery) - AFib Clinic - Structural Heart Clinic - Vascular Surgery  - Vascular Ultrasound  5th Floor: - HeartCare Cardiology (general and EP) - Clinical Pharmacy for coumadin, hypertension, lipid, weight-loss medications, and med management appointments    Valet parking services will be available as well.

## 2023-11-28 NOTE — Progress Notes (Signed)
 Electrophysiology Office Note:    Date:  11/28/2023   ID:  Todd Greer, DOB [birth year], MRN [id number]  PCP:  Rema Fendt, NP   [facility] Providers Cardiologist:  Bryan Lemma, MD Electrophysiologist:  Maurice Small, MD     Referring MD: Rema Fendt, NP   History of Present Illness:    Todd Greer is a 68 y.o. male with a hx listed below, significant for CHF with recovered EF, hepatitis C, frequent PVCs referred for arrhythmia management.  January 2019, he had heart failure with severely reduced EF (20%).  His LV function recovered with GDMT.  He noticed decreased pulse rates in November 2023.  Specifically, heart rates were at lowest 30s at nighttime.  Zio patch was placed in January that showed 7% PVC burden.  01/10/2023  At his last visit, we stopped his beta-blocker and started diltiazem and magnesium.  He reports that he no longer has muscle soreness with the diltiazem and magnesium.  His fatigue is somewhat improved but not resolved.    EKGs/Labs/Other Studies Reviewed Today:    Echocardiogram:  TTE 09/10/2022   EF 60-65%, moderate LVH  TTE 07/07/2023 EF 55 to 60%.  Normal structure and function  Monitors:  09/2022 Zio monitor 27% PVCs, essentially monomorphic  Stress testing:  2014 - negative myoview    Advanced imaging:    EKG:  Last EKG results: today - sinus rhythm. PVCs in trigeminy. Single PVC morphology. PAC   Recent Labs: 05/01/2023: BUN 20; Creatinine, Ser 1.25; Potassium 4.2; Sodium 135     Physical Exam:    VS:  There were no vitals taken for this visit.    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 Superiorly-directed axis (-III, aVF; +II); ++I, aVL; -aVR; subsequent visit, his PVC was + in V1-V6, + II, III, aVF Continue magnesium, diltiazem 240 mg Ablation would be difficult -- he has had multiple morphologies. Echocardiogram shows preserved ejection fraction  Heart failure with recovered EF EF 15-20%  in 2019, normalized on TTE 09/2022 Etiology -- HTN? EtOH/substance abuse? PVCs? -- PVCs seem unlikely since he continued to have PVCs after EF normalized.   Because his EF has recovered, and he is asymptomatic, I do not see a strong indication for PVC suppression.  Additionally, I am concerned he would not be a good candidate for ablation due to multiple morphologies.  At this point, I think he is safe to be followed in general cardiology clinic, and I will be happy to see him again if he either begins to have symptoms from his PVCs or he experiences a decline in EF contributable to PVCs.   Medication Adjustments/Labs and Tests Ordered: Current medicines are reviewed at length with the patient today.  Concerns regarding medicines are outlined above.  Orders Placed This Encounter  Procedures   EKG 12-Lead   No  orders of the defined types were placed in this encounter.    Signed, Maurice Small, MD  11/28/2023 12:16 PM    [facility]

## 2023-12-01 ENCOUNTER — Other Ambulatory Visit: Payer: Self-pay

## 2023-12-09 ENCOUNTER — Other Ambulatory Visit: Payer: Self-pay

## 2023-12-09 ENCOUNTER — Ambulatory Visit (AMBULATORY_SURGERY_CENTER)

## 2023-12-09 VITALS — Ht 67.0 in | Wt 160.0 lb

## 2023-12-09 DIAGNOSIS — Z1211 Encounter for screening for malignant neoplasm of colon: Secondary | ICD-10-CM

## 2023-12-09 MED ORDER — NA SULFATE-K SULFATE-MG SULF 17.5-3.13-1.6 GM/177ML PO SOLN
1.0000 | Freq: Once | ORAL | 0 refills | Status: AC
  Filled 2023-12-09 – 2023-12-22 (×2): qty 354, 1d supply, fill #0

## 2023-12-09 NOTE — Progress Notes (Signed)

## 2023-12-22 ENCOUNTER — Other Ambulatory Visit: Payer: Self-pay

## 2023-12-23 ENCOUNTER — Other Ambulatory Visit: Payer: Self-pay

## 2023-12-24 ENCOUNTER — Encounter: Payer: Self-pay | Admitting: Gastroenterology

## 2023-12-30 ENCOUNTER — Encounter: Payer: Self-pay | Admitting: Gastroenterology

## 2023-12-30 ENCOUNTER — Ambulatory Visit: Admitting: Gastroenterology

## 2023-12-30 VITALS — BP 103/64 | HR 83 | Temp 97.9°F | Resp 15 | Ht 61.0 in | Wt 160.0 lb

## 2023-12-30 DIAGNOSIS — D123 Benign neoplasm of transverse colon: Secondary | ICD-10-CM | POA: Diagnosis not present

## 2023-12-30 DIAGNOSIS — Z1211 Encounter for screening for malignant neoplasm of colon: Secondary | ICD-10-CM | POA: Diagnosis not present

## 2023-12-30 DIAGNOSIS — K5730 Diverticulosis of large intestine without perforation or abscess without bleeding: Secondary | ICD-10-CM

## 2023-12-30 DIAGNOSIS — K635 Polyp of colon: Secondary | ICD-10-CM

## 2023-12-30 DIAGNOSIS — I10 Essential (primary) hypertension: Secondary | ICD-10-CM | POA: Diagnosis not present

## 2023-12-30 MED ORDER — DEXTROSE 5 % IV SOLN: INTRAVENOUS | Status: AC

## 2023-12-30 MED ORDER — SODIUM CHLORIDE 0.9 % IV SOLN: 500.0000 mL | Freq: Once | INTRAVENOUS | Status: DC

## 2023-12-30 NOTE — Progress Notes (Unsigned)
 Pt's states no medical or surgical changes since previsit or office visit.

## 2023-12-30 NOTE — Progress Notes (Unsigned)
 Sedate, gd SR, tolerated procedure well, VSS, report to RN

## 2023-12-30 NOTE — Progress Notes (Unsigned)
 [facility] History and Physical   Primary Care Physician:  Senaida Dama, NP   Reason for Procedure:  Colorectal cancer screening  Plan:    Screening colonoscopy with possible interventions as needed     HPI: Todd Greer is a very pleasant 68 y.o. male here for screening colonoscopy. Denies any nausea, vomiting, abdominal pain, melena or bright red blood per rectum  The risks and benefits as well as alternatives of endoscopic procedure(s) have been discussed and reviewed. All questions answered. The patient agrees to proceed.    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 BID, empagliflozin  10 mg daily, spironolactone  25 mg daily.   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  Mild RV dilation.  Mild MR.  Catheterization showed EF 25% normal EDP and normal coronaries (has been followed by Dr. Glena Landau)   Obesity, Class II, BMI 35-39.9, with comorbidity    Obesity, diabetes, and hypertension syndrome (HCC)    Substance abuse (HCC)     Past Surgical History:  Procedure Laterality Date   LEFT HEART CATH AND CORONARY ANGIOGRAPHY N/A 09/09/2017   Procedure: LEFT HEART CATH AND CORONARY ANGIOGRAPHY;  Surgeon: Chapman Commodore, MD;  Location: [facility] INVASIVE CV LAB;  Service: Cardiovascular; EF 15 to 20%.  Diffuse HK.  Normal LVEDP.  Normal coronaries.   NM  EXERCISE MYOVIEW LTD  06/02/2013   Good exercise capacity.  Hypertensive response to exercise.  Dyspneic on exercise but no significant ST changes to suggest ischemia.  Normal stress images.  No ischemia or infarction   TRANSTHORACIC  ECHOCARDIOGRAM  09/06/2017   EF 15 to 20%.  Diffuse HK.  Severe LA dilation.  Mild RV dilation.  Mild MR.    Prior to Admission medications   Medication Sig Start Date End Date Taking? Authorizing Provider  Blood Glucose Monitoring Suppl (ONE TOUCH ULTRA 2) w/Device KIT Use 4 (four) times daily -  before meals and at bedtime. 10/09/23  Yes Rogerio Clay, Amy J, NP  diltiazem  (CARDIZEM  CD) 240 MG 24 hr capsule Take 1 capsule (240 mg total) by mouth daily. 01/10/23 01/11/24 Yes Mealor, Donnamae Gaba, MD  ENTRESTO  49-51 MG Take 1 tablet by mouth 2 (two) times daily. 02/04/23  Yes Arleen Lacer, MD  glucose blood [facility] ULTRA) test strip Check blood sugars 4 times a day before and after meals 05/01/23  Yes Rogerio Clay, Amy J, NP  OneTouch Delica Lancets 33G MISC Check blood sugars 4 times a day before and after meals 08/15/21  Yes Rogerio Clay, Amy J, NP  tadalafil  (CIALIS ) 20 MG tablet Take 20 mg by mouth daily as needed. 12/08/23  Yes [provider]  atorvastatin  (LIPITOR) 20 MG tablet Take 1 tablet (20 mg total) by mouth daily. 08/28/23   Senaida Dama, NP  glipiZIDE  (GLIPIZIDE  XL) 2.5 MG 24 hr tablet Take 1 tablet (2.5 mg total) by mouth daily. 11/03/23   Senaida Dama, NP  metFORMIN  (GLUCOPHAGE ) 1000 MG tablet Take 0.5 tablets (500 mg total) by mouth daily with breakfast. 09/01/23   Senaida Dama, NP  spironolactone  (ALDACTONE ) 25 MG  tablet Take 1 tablet (25 mg total) by mouth daily. 10/09/23   Senaida Dama, NP    Current Outpatient Medications  Medication Sig Dispense Refill   Blood Glucose Monitoring Suppl (ONE TOUCH ULTRA 2) w/Device KIT Use 4 (four) times daily -  before meals and at bedtime. 1 kit 0   diltiazem  (CARDIZEM  CD) 240 MG 24 hr capsule Take 1 capsule (240 mg total) by mouth daily. 90 capsule 3   ENTRESTO  49-51 MG Take 1 tablet by mouth 2 (two) times daily. 180 tablet 2   glucose blood (ONETOUCH ULTRA) test strip Check blood sugars 4 times a day before and after meals 100 each 12    OneTouch Delica Lancets 33G MISC Check blood sugars 4 times a day before and after meals 100 each 12   tadalafil  (CIALIS ) 20 MG tablet Take 20 mg by mouth daily as needed.     atorvastatin  (LIPITOR) 20 MG tablet Take 1 tablet (20 mg total) by mouth daily. 90 tablet 0   glipiZIDE  (GLIPIZIDE  XL) 2.5 MG 24 hr tablet Take 1 tablet (2.5 mg total) by mouth daily. 30 tablet 1   metFORMIN  (GLUCOPHAGE ) 1000 MG tablet Take 0.5 tablets (500 mg total) by mouth daily with breakfast. 30 tablet 1   spironolactone  (ALDACTONE ) 25 MG tablet Take 1 tablet (25 mg total) by mouth daily. 90 tablet 0   Current Facility-Administered Medications  Medication Dose Route Frequency Provider Last Rate Last Admin   0.9 %  sodium chloride  infusion  500 mL Intravenous Once Zamani Crocker V, MD       dextrose 5 % solution   Intravenous Continuous Shallyn Constancio V, MD        Allergies as of 12/30/2023 - Review Complete 12/30/2023  Allergen Reaction Noted   Amlodipine  Other (See Comments) 06/03/2014   Ace inhibitors Cough 06/03/2014   Lisinopril  Cough 04/02/2023    Family History  Problem Relation Age of Onset   Cerebral aneurysm Mother        Died at a young age.   Stroke Father    Diabetes Father    Hypertension Father    Cancer Sister    Diabetes Paternal Grandmother    Hypertension Paternal Grandmother    Stroke Paternal Grandmother    Colon cancer Neg Hx    Rectal cancer Neg Hx    Stomach cancer Neg Hx    Esophageal cancer Neg Hx    Colon polyps Neg Hx     Social History   Socioeconomic History   Marital status: Married    Spouse name: Not on file   Number of children: Not on file   Years of education: Not on file   Highest education level: Not on file  Occupational History   Not on file  Tobacco Use   Smoking status: Former    Current packs/day: 0.00    Average packs/day: 0.5 packs/day for 10.0 years (5.0 ttl pk-yrs)    Types: Cigarettes    Start date: 10/03/1973    Quit date: 10/04/1983     Years since quitting: 40.2    Passive exposure: Past   Smokeless tobacco: Never  Vaping Use   Vaping status: Never Used  Substance and Sexual Activity   Alcohol use: Yes    Alcohol/week: 6.0 standard drinks of alcohol    Types: 6 Standard drinks or equivalent per week   Drug use: No   Sexual activity: Yes  Other Topics Concern   Not on file  Social History Narrative   His married father of 4.  His married for 28 years.  He lives with his wife and daughters.  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  Education: [employer].   He quit smoking in 1985.  He drinks 3 beers a week.   Prior to the onset of his current symptoms, used to work out with weights for at least 40 minutes a day for 3 times a week.  He usually did light weights for many occasions as opposed to heavy weights.      Contacts: Wife Todd Greer; daughter Todd Greer   Social Drivers of Health   Financial Resource Strain: Low Risk  (08/04/2023)   Overall Financial Resource Strain (CARDIA)    Difficulty of Paying Living Expenses: Not hard at all  Food Insecurity: No Food Insecurity (08/04/2023)   Hunger Vital Sign    Worried About Running Out of Food in the Last Year: Never true    Ran Out of Food in the Last Year: Never true  Transportation Needs: No Transportation Needs (08/04/2023)   PRAPARE - Administrator, Civil Service (Medical): No    Lack of Transportation (Non-Medical): No  Physical Activity: Not on file  Stress: No Stress Concern Present (08/04/2023)   [employer] of Occupational Health - Occupational Stress Questionnaire    Feeling of Stress : Not at all  Social Connections: Not on file  Intimate Partner Violence: Not At Risk (08/04/2023)   Humiliation, Afraid, Rape, and Kick questionnaire    Fear of Current or Ex-Partner: No    Emotionally Abused: No    Physically Abused: No    Sexually Abused: No    Review of Systems:  All other review  of systems negative except as mentioned in the HPI.  Physical Exam: Vital signs in last 24 hours: BP 104/73   Pulse 81   Temp 97.9 F (36.6 C)   Ht 5\' 1"  (1.549 m)   Wt 160 lb (72.6 kg)   SpO2 99%   BMI 30.23 kg/m  General:   Alert, NAD Lungs:  Clear .   Heart:  Regular rate and rhythm Abdomen:  Soft, nontender and nondistended. Neuro/Psych:  Alert and cooperative. Normal mood and affect. A and O x 3  Reviewed labs, radiology imaging, old records and pertinent past GI work up  Patient is appropriate for planned procedure(s) and anesthesia in an ambulatory setting   K. Veena Alita Waldren , MD [phone]

## 2023-12-30 NOTE — Patient Instructions (Signed)

## 2023-12-30 NOTE — Progress Notes (Signed)
 Called to room to assist during endoscopic procedure.  Patient ID and intended procedure confirmed with present staff. Received instructions for my participation in the procedure from the performing physician.

## 2023-12-30 NOTE — Op Note (Signed)
 [facility] Patient Name: Todd Greer Procedure Date: 12/30/2023 10:25 AM MRN: [id number] Endoscopist: Sergio Dandy , MD, [id number] Age: 68 Referring MD:  Date of Birth: 1955-12-09 Gender: Male Account #: [id number] Procedure:                Colonoscopy Indications:              Screening for colorectal malignant neoplasm Medicines:                Monitored Anesthesia Care Procedure:                Pre-Anesthesia Assessment:                           - Prior to the procedure, a History and Physical                            was performed, and patient medications and                            allergies were reviewed. The patient's tolerance of                            previous anesthesia was also reviewed. The risks                            and benefits of the procedure and the sedation                            options and risks were discussed with the patient.                            All questions were answered, and informed consent                            was obtained. Prior Anticoagulants: The patient has                            taken no anticoagulant or antiplatelet agents. ASA                            Grade Assessment: III - A patient with severe                            systemic disease. After reviewing the risks and                            benefits, the patient was deemed in satisfactory                            condition to undergo the procedure.                           After obtaining informed consent, the colonoscope  was passed under direct vision. Throughout the                            procedure, the patient's blood pressure, pulse, and                            oxygen  saturations were monitored continuously. The                            Olympus Scope SN [id number] was introduced through the                            anus and advanced to the the cecum, identified by                             appendiceal orifice and ileocecal valve. The                            colonoscopy was performed without difficulty. The                            patient tolerated the procedure well. The quality                            of the bowel preparation was good. The ileocecal                            valve, appendiceal orifice, and rectum were                            photographed. Scope In: 10:48:16 AM Scope Out: 11:03:51 AM Scope Withdrawal Time: 0 hours 8 minutes 55 seconds  Total Procedure Duration: 0 hours 15 minutes 35 seconds  Findings:                 The perianal and digital rectal examinations were                            normal.                           A 5 mm polyp was found in the transverse colon. The                            polyp was sessile. The polyp was removed with a                            cold snare. Resection and retrieval were complete.                           A few small-mouthed diverticula were found in the                            sigmoid colon, descending colon, transverse colon  and ascending colon. Complications:            No immediate complications. Estimated Blood Loss:     Estimated blood loss was minimal. Impression:               - One 5 mm polyp in the transverse colon, removed                            with a cold snare. Resected and retrieved.                           - Diverticulosis in the sigmoid colon, in the                            descending colon, in the transverse colon and in                            the ascending colon. Recommendation:           - Resume previous diet.                           - Continue present medications.                           - Await pathology results.                           - Repeat colonoscopy in 5-10 years for surveillance. Lindberg Zenon V. Tacari Repass, MD 12/30/2023 11:19:22 AM This report has been signed electronically.

## 2023-12-31 ENCOUNTER — Other Ambulatory Visit: Payer: Self-pay

## 2023-12-31 ENCOUNTER — Telehealth: Payer: Self-pay | Admitting: *Deleted

## 2023-12-31 ENCOUNTER — Encounter: Payer: Self-pay | Admitting: Gastroenterology

## 2023-12-31 NOTE — Telephone Encounter (Signed)
 Attempted post procedure follow up call.  No answer - LVM.

## 2024-01-01 LAB — SURGICAL PATHOLOGY

## 2024-01-05 ENCOUNTER — Other Ambulatory Visit: Payer: Self-pay | Admitting: Cardiovascular Disease

## 2024-01-05 ENCOUNTER — Other Ambulatory Visit: Payer: Self-pay | Admitting: Family

## 2024-01-05 DIAGNOSIS — I5022 Chronic systolic (congestive) heart failure: Secondary | ICD-10-CM

## 2024-01-05 DIAGNOSIS — K5720 Diverticulitis of large intestine with perforation and abscess without bleeding: Secondary | ICD-10-CM | POA: Diagnosis not present

## 2024-01-05 DIAGNOSIS — E1165 Type 2 diabetes mellitus with hyperglycemia: Secondary | ICD-10-CM

## 2024-01-05 DIAGNOSIS — E1142 Type 2 diabetes mellitus with diabetic polyneuropathy: Secondary | ICD-10-CM

## 2024-01-05 DIAGNOSIS — I493 Ventricular premature depolarization: Secondary | ICD-10-CM

## 2024-01-05 DIAGNOSIS — I10 Essential (primary) hypertension: Secondary | ICD-10-CM

## 2024-01-06 ENCOUNTER — Other Ambulatory Visit: Payer: Self-pay

## 2024-01-06 ENCOUNTER — Other Ambulatory Visit: Payer: Self-pay | Admitting: Family

## 2024-01-06 DIAGNOSIS — E1165 Type 2 diabetes mellitus with hyperglycemia: Secondary | ICD-10-CM

## 2024-01-06 MED ORDER — ONETOUCH ULTRA 2 W/DEVICE KIT
1.0000 | PACK | Freq: Three times a day (TID) | 0 refills | Status: AC
  Filled 2024-01-06 – 2024-03-02 (×3): qty 1, 30d supply, fill #0

## 2024-01-06 MED ORDER — GLIPIZIDE ER 2.5 MG PO TB24
2.5000 mg | ORAL_TABLET | Freq: Every day | ORAL | 0 refills | Status: DC
  Filled 2024-01-06: qty 90, 90d supply, fill #0

## 2024-01-06 MED ORDER — DILTIAZEM HCL ER COATED BEADS 240 MG PO CP24
240.0000 mg | ORAL_CAPSULE | Freq: Every day | ORAL | 2 refills | Status: AC
  Filled 2024-01-06: qty 90, 90d supply, fill #0
  Filled 2024-04-05: qty 90, 90d supply, fill #1
  Filled 2024-07-07: qty 90, 90d supply, fill #2

## 2024-01-06 NOTE — Telephone Encounter (Signed)
-   Patient established with [facility] at [facility] A Dept of [employer]. [facility] [employer]. Request refills from the same (Spironolactone ). Please let me know if I can further assist. Thank you. - Glipizide  prescribed.  - Blood Glucose Monitoring Supply prescribed.

## 2024-01-06 NOTE — Telephone Encounter (Signed)
 I called patient and made him aware of his PCP recommendations for medication refill Patient established with [facility] at [facility] A Dept of [employer] [place]. [facility] [employer].

## 2024-01-06 NOTE — Telephone Encounter (Signed)
 Patient established with [facility] at [facility] A Dept of [employer]. [facility] [employer]. Please let me know if I can further assist. Thank you.

## 2024-01-06 NOTE — Telephone Encounter (Signed)
 Noted.

## 2024-01-08 ENCOUNTER — Other Ambulatory Visit: Payer: Self-pay

## 2024-01-09 DIAGNOSIS — K5780 Diverticulitis of intestine, part unspecified, with perforation and abscess without bleeding: Secondary | ICD-10-CM | POA: Diagnosis not present

## 2024-01-14 ENCOUNTER — Ambulatory Visit: Payer: Medicare Other | Attending: Cardiology | Admitting: Cardiology

## 2024-01-14 ENCOUNTER — Other Ambulatory Visit: Payer: Self-pay

## 2024-01-14 ENCOUNTER — Encounter: Payer: Self-pay | Admitting: Cardiology

## 2024-01-14 VITALS — BP 90/56 | HR 72 | Ht 67.0 in | Wt 157.0 lb

## 2024-01-14 DIAGNOSIS — I428 Other cardiomyopathies: Secondary | ICD-10-CM | POA: Diagnosis not present

## 2024-01-14 DIAGNOSIS — I5032 Chronic diastolic (congestive) heart failure: Secondary | ICD-10-CM

## 2024-01-14 DIAGNOSIS — I493 Ventricular premature depolarization: Secondary | ICD-10-CM | POA: Diagnosis not present

## 2024-01-14 DIAGNOSIS — I10 Essential (primary) hypertension: Secondary | ICD-10-CM | POA: Diagnosis not present

## 2024-01-14 DIAGNOSIS — R001 Bradycardia, unspecified: Secondary | ICD-10-CM

## 2024-01-14 DIAGNOSIS — E785 Hyperlipidemia, unspecified: Secondary | ICD-10-CM | POA: Diagnosis not present

## 2024-01-14 MED ORDER — ENTRESTO 49-51 MG PO TABS: 1.0000 | ORAL_TABLET | Freq: Two times a day (BID) | ORAL | 2 refills | Status: AC

## 2024-01-14 NOTE — Progress Notes (Signed)
 Cardiology Office Note:  .   Date:  01/18/2024  ID:  Todd Greer, DOB 07-28-1956, MRN [id number] PCP: Senaida Dama, NP  [facility] Providers Cardiologist:  Randene Bustard, MD Electrophysiologist:  Efraim Grange, MD     Chief Complaint  Patient presents with   Follow-up    Notes dizziness-lightheadedness    Cardiomyopathy    Resolved.  No CHF symptoms.    Patient Profile: .     Todd Greer is a relatively healthy-appearing 68 y.o. male  with a Longstanding History of Nonischemic Cardiomyopathy (now resolved) who presents here for routine follow-up at the request of Senaida Dama, NP.  January 2019, NICM: Resolved Echo: EF 15-20% with severe diffuse HK.  No RWMA.  Normal valves. Cath: Normal coronaries, Right Dominant with Ramus Intermedius; EF < 25% with elevated LVEDP GDMT with Entresto , carvedilol  and spironolactone  along with Jardiance  Echo January 2024: EF 60 to 65% no RWMA. Follow-up Echo November 2024: EF 55 to 60%. 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  I referred him to EP for evaluation of PVC  He was most recently seen by Dr. Spence Dux from EP on November 28, 2023 for evaluation of his PVCs and bradycardia.  7% PVC burden on Zio patch monitor in January.  Previous visit beta-blocker stopped and he was started on diltiazem  plus magnesium .  No longer noticed muscle soreness and fatigue improved but not fully resolved.  He felt that ablation would be difficult due to the multiple morphologies.  Follow-up Echo showed preserved EF.  Subjective  Discussed the use of AI scribe software for clinical note transcription with the patient, who gave verbal consent to proceed.  History of Present Illness Todd Greer presents  here today noting dizziness and low blood pressure. He has a history of premature ventricular contractions (PVCs) but does not feel them. He experiences dizziness, particularly when standing up quickly or bending over, without any episodes of syncope. His blood pressure has been low, especially since he is active in the gym daily, with readings as low as 100 mmHg, though typically around 110 mmHg. Lightheadedness and wooziness are more pronounced in the mornings.  He is currently on diltiazem  240 mg, spironolactone , and Entresto , taking Entresto  twice daily. No palpitations, irregular heartbeats, or episodes of feeling his heart stop. No swelling, orthopnea, or paroxysmal nocturnal dyspnea. No chest pain, pressure, or tightness, and he remains active in the gym without issues during exercise.  No syncope or near syncope, TIA or amaurosis fugax.  No claudication.  He is on cholesterol medication and is due for a cholesterol check next month. His last LDL reading in February 2022 was 73 mg/dL. No muscle aches or cramping, and his energy levels are good.   Objective  Current Meds  Medication Sig   atorvastatin  (LIPITOR) 20 MG tablet Take 1 tablet (20 mg total) by mouth daily.   diltiazem  (CARDIZEM  CD) 240 MG 24 hr capsule Take 1 capsule (240 mg total) by mouth daily.   ENTRESTO  49-51 MG Take 1 tablet by mouth 2 (two) times daily.   glipiZIDE  (GLIPIZIDE  XL) 2.5 MG 24 hr tablet Take 1 tablet (2.5 mg total) by mouth daily.   Magnesium  400 MG TABS Take 2 tablets by mouth. Name is Magnesium  Taurate  metFORMIN  (GLUCOPHAGE ) 1000 MG tablet Take 0.5 tablets (500 mg total) by mouth daily with breakfast.   spironolactone  (ALDACTONE ) 25 MG tablet Take 1 tablet (25 mg total) by mouth daily.   Blood Glucose Monitoring Suppl (ONE TOUCH ULTRA 2) w/Device KIT Use 4 (four) times daily -  before meals and at bedtime.    Studies Reviewed: Aaron Aas   EKG Interpretation Date/Time:  Wednesday Jan 14 2024 11:19:37  EDT Ventricular Rate:  72 PR Interval:  204 QRS Duration:  106 QT Interval:  388 QTC Calculation: 424 R Axis:   -65  Text Interpretation: Sinus rhythm with frequent Premature ventricular complexes Left anterior fascicular block Possible Anterior infarct (cited on or before 14-Jan-2024) When compared with ECG of 28-Nov-2023 12:16, Premature ventricular complexes are now Present Questionable change in initial forces of Septal leads Confirmed by Randene Bustard ([id number]) on 01/14/2024 11:35:20 AM    ECHO (07/07/2023): Difficult to assess with PVCs throughout the entire study.  LVEF estimated 50 to 60%.  Mildly dilated LV.  Mildly reduced RV function.  Trivial MR.  Normal aortic valve.  Normal RAP.  Lab Results  Component Value Date   CHOL 141 10/25/2020   HDL 53 10/25/2020   LDLCALC 73 10/25/2020   TRIG 76 10/25/2020   CHOLHDL 2.7 10/25/2020    Labs from PCP: A1c 11/03/2023 (6.0)      Component Ref Range & Units (hover) 8 mo ago (05/01/23) 1 yr ago (10/21/22) 1 yr ago (04/29/22)  Glucose 151 High  127 High  122 High   BUN 20 17 16   Creatinine, Ser 1.25 1.04 1.05  eGFR 64 79 79  BUN/Creatinine Ratio 16 16 15   Sodium 135 139 137  Potassium 4.2 4.2 4.5  Chloride 100 104 103  CO2 19 Low  17 Low  20  Calcium  8.9 9.6 9.8   Risk Assessment/Calculations:         Physical Exam:   VS:  BP (!) 90/56   Pulse 72   Ht 5\' 7"  (1.702 m)   Wt 157 lb (71.2 kg)   SpO2 97%   BMI 24.59 kg/m    Wt Readings from Last 3 Encounters:  01/14/24 157 lb (71.2 kg)  12/30/23 160 lb (72.6 kg)  12/09/23 160 lb (72.6 kg)    GEN: Healthy appearing.  Well nourished, well groomed in no acute distress;  NECK: No JVD; No carotid bruits CARDIAC: RRR with significant ectopy; Normal S1, S2;  no murmurs, rubs, gallops RESPIRATORY:  Clear to auscultation without rales, wheezing or rhonchi ; nonlabored, good air movement. ABDOMEN: Soft, non-tender, non-distended EXTREMITIES:  No edema; No deformity       ASSESSMENT AND PLAN: .    Problem List Items Addressed This Visit       Cardiology Problems   (HFpEF) heart failure with preserved ejection fraction (HCC) (Chronic)   No longer having CHF symptoms.  Seems euvolemic but will continue with current medications.  However with low blood pressures we are weaning down his antihypertensive agents by reducing the morning dose of Entresto ..      Relevant Medications   ENTRESTO  49-51 MG   Other Relevant Orders   EKG 12-Lead (Completed)   Essential hypertension (Chronic)   Now having issues with hypotension. Blood pressure consistently low, on current dose of Entresto  and spironolactone .  Target BP is 110 mmHg. - Reduce morning dose of Entresto  49-51 mg to 1/2 tablet. => 1/2 tab in AM full tab PM - Continue 25 mg spironolactone -hold  spironolactone  if lightheaded in the morning. - Monitor blood pressure, report persistent dizziness. - Consider reducing nighttime Entresto  if dizziness persists.      Relevant Medications   ENTRESTO  49-51 MG   Other Relevant Orders   EKG 12-Lead (Completed)   Frequent PVCs - Primary (Chronic)   Significant PVC burden noted on monitor.   Switch from beta-blocker to carvedilol  by Dr. Arlester Ladd.   Current EKG has PVCs in trigeminy pattern. Remains asymptomatic, and EF has actually normalized, staying in the normal range despite PVCs.. Felt to be unfavorable for ablation because of multiple morphologies. - Continue diltiazem  to 40 mg daily along with magnesium  400 mg tablets       Relevant Medications   ENTRESTO  49-51 MG   Hyperlipidemia with target LDL less than 100 (Chronic)   LDL cholesterol at 73 mg/dL, target 100mg /dL.  No adverse effects from atorvastatin  20 mg daily. - Check cholesterol levels at next appointment with Dr. Florie Husband. - Continue current dose of atorvastatin  20 mg, monitor for side effects.      Relevant Medications   ENTRESTO  49-51 MG   Non-ischemic cardiomyopathy (HCC) - Resolved  (Chronic)   Back-to-back echo showed normal EF. With normalized EF, beta-blocker converted to diltiazem  and improved side effect profile. - Continue Entresto  but will reduce morning dose to 1/2 tablet continuing full tablet in the evening. - Continue diltiazem  CD 240 mg daily - Continue spironolactone  25 mg daily but hold for symptoms of lightheadedness/dizziness or hypotension.      Relevant Medications   ENTRESTO  49-51 MG       Follow-Up: Return in about 6 months (around 07/16/2024) for [employer].     Signed, Arleen Lacer, MD, MS Randene Bustard, M.D., M.S. Interventional Chartered certified accountant  Pager # [phone]

## 2024-01-14 NOTE — Patient Instructions (Signed)
 Medication Instructions:  - Start Entresto  49-51 MG 0.5 tablet in the AM and a full tablet in the PM.   *If you experience increased dizziness even with taking this decreased dose of Entresto , HOLD your spironolactone  for that day as well*   *If you need a refill on your cardiac medications before your next appointment, please call your pharmacy*   Lab Work: None    If you have labs (blood work) drawn today and your tests are completely normal, you will receive your results only by: MyChart Message (if you have MyChart) OR A paper copy in the mail If you have any lab test that is abnormal or we need to change your treatment, we will call you to review the results.   Testing/Procedures: None    Follow-Up: At [facility], you and your health needs are our priority.  As part of our continuing mission to provide you with exceptional heart care, we have created designated Provider Care Teams.  These Care Teams include your primary Cardiologist (physician) and Advanced Practice Providers (APPs -  Physician Assistants and Nurse Practitioners) who all work together to provide you with the care you need, when you need it.  We recommend signing up for the patient portal called "MyChart".  Sign up information is provided on this After Visit Summary.  MyChart is used to connect with patients for Virtual Visits (Telemedicine).  Patients are able to view lab/test results, encounter notes, upcoming appointments, etc.  Non-urgent messages can be sent to your provider as well.   To learn more about what you can do with MyChart, go to [email or web address].    Your next appointment:   6 month(s)  The format for your next appointment:   In Person  Provider:   Randene Bustard, MD   Other Instructions

## 2024-01-18 ENCOUNTER — Encounter: Payer: Self-pay | Admitting: Cardiology

## 2024-01-18 NOTE — Assessment & Plan Note (Signed)
 No longer having CHF symptoms.  Seems euvolemic but will continue with current medications.  However with low blood pressures we are weaning down his antihypertensive agents by reducing the morning dose of Entresto .Todd Greer

## 2024-01-18 NOTE — Assessment & Plan Note (Signed)
 Back-to-back echo showed normal EF. With normalized EF, beta-blocker converted to diltiazem  and improved side effect profile. - Continue Entresto  but will reduce morning dose to 1/2 tablet continuing full tablet in the evening. - Continue diltiazem  CD 240 mg daily - Continue spironolactone  25 mg daily but hold for symptoms of lightheadedness/dizziness or hypotension.

## 2024-01-18 NOTE — Assessment & Plan Note (Signed)
 Significant PVC burden noted on monitor.   Switch from beta-blocker to carvedilol  by Dr. Arlester Ladd.   Current EKG has PVCs in trigeminy pattern. Remains asymptomatic, and EF has actually normalized, staying in the normal range despite PVCs.. Felt to be unfavorable for ablation because of multiple morphologies. - Continue diltiazem  to 40 mg daily along with magnesium  400 mg tablets

## 2024-01-18 NOTE — Assessment & Plan Note (Addendum)
 LDL cholesterol at 73 mg/dL, target 100mg /dL.  No adverse effects from atorvastatin  20 mg daily. - Check cholesterol levels at next appointment with Dr. Florie Husband. - Continue current dose of atorvastatin  20 mg, monitor for side effects.

## 2024-01-18 NOTE — Assessment & Plan Note (Signed)
 Now having issues with hypotension. Blood pressure consistently low, on current dose of Entresto  and spironolactone .  Target BP is 110 mmHg. - Reduce morning dose of Entresto  49-51 mg to 1/2 tablet. => 1/2 tab in AM full tab PM - Continue 25 mg spironolactone -hold spironolactone  if lightheaded in the morning. - Monitor blood pressure, report persistent dizziness. - Consider reducing nighttime Entresto  if dizziness persists.

## 2024-01-20 ENCOUNTER — Other Ambulatory Visit: Payer: Self-pay

## 2024-01-20 ENCOUNTER — Other Ambulatory Visit: Payer: Self-pay | Admitting: Family

## 2024-01-20 DIAGNOSIS — I5022 Chronic systolic (congestive) heart failure: Secondary | ICD-10-CM

## 2024-01-20 DIAGNOSIS — I10 Essential (primary) hypertension: Secondary | ICD-10-CM

## 2024-01-21 ENCOUNTER — Other Ambulatory Visit: Payer: Self-pay

## 2024-01-21 MED ORDER — ATORVASTATIN CALCIUM 20 MG PO TABS
20.0000 mg | ORAL_TABLET | Freq: Every day | ORAL | 0 refills | Status: AC
  Filled 2024-01-21: qty 90, 90d supply, fill #0

## 2024-01-21 NOTE — Telephone Encounter (Signed)
 Patient established with [facility] at [facility] [employer] (last office visit 01/14/2024). Request refills from the same. Please let me know if I can further assist.

## 2024-01-23 ENCOUNTER — Other Ambulatory Visit: Payer: Self-pay

## 2024-01-23 MED ORDER — SPIRONOLACTONE 25 MG PO TABS
25.0000 mg | ORAL_TABLET | Freq: Every day | ORAL | 0 refills | Status: AC
  Filled 2024-01-23: qty 90, 90d supply, fill #0

## 2024-01-27 NOTE — Telephone Encounter (Signed)
 Spironolactone  prescribed 01/23/2024.

## 2024-01-29 ENCOUNTER — Other Ambulatory Visit: Payer: Self-pay

## 2024-02-03 ENCOUNTER — Encounter: Payer: Self-pay | Admitting: Family

## 2024-02-03 ENCOUNTER — Other Ambulatory Visit: Payer: Self-pay

## 2024-02-03 ENCOUNTER — Ambulatory Visit (INDEPENDENT_AMBULATORY_CARE_PROVIDER_SITE_OTHER): Admitting: Family

## 2024-02-03 VITALS — BP 120/82 | HR 82 | Temp 98.7°F | Resp 16 | Ht 67.0 in | Wt 154.6 lb

## 2024-02-03 DIAGNOSIS — E785 Hyperlipidemia, unspecified: Secondary | ICD-10-CM | POA: Diagnosis not present

## 2024-02-03 DIAGNOSIS — Z0100 Encounter for examination of eyes and vision without abnormal findings: Secondary | ICD-10-CM

## 2024-02-03 DIAGNOSIS — Z130 Encounter for screening for diseases of the blood and blood-forming organs and certain disorders involving the immune mechanism: Secondary | ICD-10-CM | POA: Diagnosis not present

## 2024-02-03 DIAGNOSIS — E1165 Type 2 diabetes mellitus with hyperglycemia: Secondary | ICD-10-CM | POA: Diagnosis not present

## 2024-02-03 DIAGNOSIS — Z7984 Long term (current) use of oral hypoglycemic drugs: Secondary | ICD-10-CM

## 2024-02-03 MED ORDER — GLIPIZIDE ER 2.5 MG PO TB24
2.5000 mg | ORAL_TABLET | Freq: Every day | ORAL | 0 refills | Status: DC
  Filled 2024-02-03: qty 90, 90d supply, fill #0

## 2024-02-03 NOTE — Progress Notes (Signed)
 Patient ID: Todd Greer, male    DOB: [birth year]  MRN: [id number]  CC: Chronic Conditions Follow-Up  Subjective: Todd Greer is a 68 y.o. male who presents for chronic conditions follow-up.   His concerns today include:  - Doing well on Glipizide , on issues/concerns. Denies red flag symptoms associated with diabetes.  - Due for diabetic eye exam. - Established with Cardiology for chronic conditions management.  - Requests CMP, CBC, cholesterol, and microalbumin labs.  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 90 tablet 0   Blood Glucose Monitoring Suppl (ONE TOUCH ULTRA 2) w/Device KIT Use 4 (four) times daily -  before meals and at bedtime. 1 kit 0   diltiazem  (CARDIZEM  CD) 240 MG 24 hr capsule Take 1 capsule (240 mg total) by mouth daily. 90 capsule 2    ENTRESTO  49-51 MG Take 1 tablet by mouth 2 (two) times daily. 180 tablet 2   glucose blood (ONETOUCH ULTRA) test strip Check blood sugars 4 times a day before and after meals 100 each 12   Magnesium  400 MG TABS Take 2 tablets by mouth. Name is Magnesium  Taurate     metFORMIN  (GLUCOPHAGE ) 1000 MG tablet Take 0.5 tablets (500 mg total) by mouth daily with breakfast. 30 tablet 1   OneTouch Delica Lancets 33G MISC Check blood sugars 4 times a day before and after meals 100 each 12   spironolactone  (ALDACTONE ) 25 MG tablet Take 1 tablet (25 mg total) by mouth daily. 90 tablet 0   tadalafil  (CIALIS ) 20 MG tablet Take 20 mg by mouth daily as needed.     No current facility-administered medications on file prior to visit.    Allergies  Allergen Reactions   Amlodipine  Other (See Comments)    Fatigue   Ace Inhibitors Cough    Tolerates ARB   Lisinopril  Cough    Social History   Socioeconomic History   Marital status: Married    Spouse name: Not on file   Number of children: Not on file   Years of education: Not on file   Highest education level: Not on file  Occupational History   Not on file  Tobacco Use   Smoking status: Former    Current packs/day: 0.00    Average packs/day: 0.5 packs/day for 10.0 years (5.0 ttl pk-yrs)    Types: Cigarettes    Start date: 10/03/1973  Quit date: 10/04/1983    Years since quitting: 40.3    Passive exposure: Past   Smokeless tobacco: Never  Vaping Use   Vaping status: Never Used  Substance and Sexual Activity   Alcohol use: Yes    Alcohol/week: 6.0 standard drinks of alcohol    Types: 6 Standard drinks or equivalent per week   Drug use: No   Sexual activity: Yes  Other Topics Concern   Not on file  Social History Narrative   His married father of 4.  His married for 28 years.  He lives with his wife and daughters.  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  Education: [employer].   He quit smoking in  1985.  He drinks 3 beers a week.   Prior to the onset of his current symptoms, used to work out with weights for at least 40 minutes a day for 3 times a week.  He usually did light weights for many occasions as opposed to heavy weights.      Contacts: Wife Todd Greer; daughter Todd Greer   Social Drivers of Health   Financial Resource Strain: Low Risk  (08/04/2023)   Overall Financial Resource Strain (CARDIA)    Difficulty of Paying Living Expenses: Not hard at all  Food Insecurity: No Food Insecurity (08/04/2023)   Hunger Vital Sign    Worried About Running Out of Food in the Last Year: Never true    Ran Out of Food in the Last Year: Never true  Transportation Needs: No Transportation Needs (08/04/2023)   PRAPARE - Administrator, Civil Service (Medical): No    Lack of Transportation (Non-Medical): No  Physical Activity: Not on file  Stress: No Stress Concern Present (08/04/2023)   [employer] of Occupational Health - Occupational Stress Questionnaire    Feeling of Stress : Not at all  Social Connections: Socially Integrated (02/03/2024)   Social Connection and Isolation Panel [NHANES]    Frequency of Communication with Friends and Family: More than three times a week    Frequency of Social Gatherings with Friends and Family: More than three times a week    Attends Religious Services: More than 4 times per year    Active Member of [employer] or Organizations: Yes    Attends Engineer, structural: More than 4 times per year    Marital Status: Married  Catering manager Violence: Not At Risk (08/04/2023)   Humiliation, Afraid, Rape, and Kick questionnaire    Fear of Current or Ex-Partner: No    Emotionally Abused: No    Physically Abused: No    Sexually Abused: No    Family History  Problem Relation Age of Onset   Cerebral aneurysm Mother        Died at a young age.   Stroke Father    Diabetes Father    Hypertension Father    Cancer Sister    Diabetes  Paternal Grandmother    Hypertension Paternal Grandmother    Stroke Paternal Grandmother    Colon cancer Neg Hx    Rectal cancer Neg Hx    Stomach cancer Neg Hx    Esophageal cancer Neg Hx    Colon polyps Neg Hx     Past Surgical History:  Procedure Laterality Date   LEFT HEART CATH AND CORONARY ANGIOGRAPHY N/A 09/09/2017   Procedure: LEFT HEART CATH AND CORONARY ANGIOGRAPHY;  Surgeon: Chapman Commodore, MD;  Location: [facility] INVASIVE [facility] LAB;  Service: Cardiovascular; EF 15 to 20%.  Diffuse HK.  Normal LVEDP.  Normal coronaries.   NM  EXERCISE MYOVIEW LTD  06/02/2013   Good exercise capacity.  Hypertensive response to exercise.  Dyspneic on exercise but no significant ST changes to suggest ischemia.  Normal stress images.  No ischemia or infarction   TRANSTHORACIC ECHOCARDIOGRAM  09/06/2017   EF 15 to 20%.  Diffuse HK.  Severe LA dilation.  Mild RV dilation.  Mild MR.    ROS: Review of Systems Negative except as stated above  PHYSICAL EXAM: BP 120/82   Pulse 82   Temp 98.7 F (37.1 C) (Oral)   Resp 16   Ht 5\' 7"  (1.702 m)   Wt 154 lb 9.6 oz (70.1 kg)   SpO2 97%   BMI 24.21 kg/m   Physical Exam HENT:     Head: Normocephalic and atraumatic.     Nose: Nose normal.     Mouth/Throat:     Mouth: Mucous membranes are moist.     Pharynx: Oropharynx is clear.  Eyes:     Extraocular Movements: Extraocular movements intact.     Conjunctiva/sclera: Conjunctivae normal.     Pupils: Pupils are equal, round, and reactive to light.  Cardiovascular:     Rate and Rhythm: Normal rate and regular rhythm.     Pulses: Normal pulses.     Heart sounds: Normal heart sounds.  Pulmonary:     Effort: Pulmonary effort is normal.     Breath sounds: Normal breath sounds.  Musculoskeletal:        General: Normal range of motion.     Cervical back: Normal range of motion and neck supple.  Neurological:     General: No focal deficit present.     Mental Status: He is alert and oriented to person,  place, and time.  Psychiatric:        Mood and Affect: Mood normal.        Behavior: Behavior normal.      ASSESSMENT AND PLAN: 1. Type 2 diabetes mellitus with hyperglycemia, without long-term current use of insulin  (HCC) (Primary) - Continue Glipizide  as prescribed.  - Hemoglobin A1c result pending.  - Routine screening.  - Discussed the importance of healthy eating habits, low-carbohydrate diet, low-sugar diet, regular aerobic exercise (at least 150 minutes a week as tolerated) and medication compliance to achieve or maintain control of diabetes. Counseled on medication adherence/adverse effects. - Follow-up with primary provider as scheduled. - Hemoglobin A1c - Microalbumin / creatinine urine ratio - CMP14+EGFR - glipiZIDE  (GLIPIZIDE  XL) 2.5 MG 24 hr tablet; Take 1 tablet (2.5 mg total) by mouth daily.  Dispense: 90 tablet; Refill: 0  2. Diabetic eye exam [facility]) - Referral to Ophthalmology for evaluation/management. - Ambulatory referral to Ophthalmology  3. Hyperlipidemia, unspecified hyperlipidemia type - Continue present management as managed by Cardiology.  - Routine screening.  - Follow-up with primary provider as scheduled. - Lipid panel  4. Screening for deficiency anemia - Routine screening.  - CBC   Patient was given the opportunity to ask questions.  Patient verbalized understanding of the plan and was able to repeat key elements of the plan. Patient was given clear instructions to go to Emergency Department or return to medical center if symptoms don't improve, worsen, or new problems develop.The patient verbalized understanding.   Orders Placed This Encounter  Procedures   Lipid panel   Hemoglobin A1c   Microalbumin / creatinine urine ratio   CBC  CMP14+EGFR   Ambulatory referral to Ophthalmology     Requested Prescriptions   Signed Prescriptions Disp Refills   glipiZIDE  (GLIPIZIDE  XL) 2.5 MG 24 hr tablet 90 tablet 0    Sig: Take 1 tablet (2.5 mg  total) by mouth daily.    Follow-up with primary provider as scheduled.  Senaida Dama, NP

## 2024-02-03 NOTE — Progress Notes (Signed)
 3 month follow up , wants lab work done to see if he can come of some of the medication

## 2024-02-04 ENCOUNTER — Ambulatory Visit: Payer: Self-pay | Admitting: Family

## 2024-02-04 LAB — CMP14+EGFR
ALT: 34 IU/L (ref 0–44)
AST: 40 IU/L (ref 0–40)
Albumin: 4.1 g/dL (ref 3.9–4.9)
Alkaline Phosphatase: 85 IU/L (ref 44–121)
BUN/Creatinine Ratio: 13 (ref 10–24)
BUN: 10 mg/dL (ref 8–27)
Bilirubin Total: 0.5 mg/dL (ref 0.0–1.2)
CO2: 21 mmol/L (ref 20–29)
Calcium: 9.3 mg/dL (ref 8.6–10.2)
Chloride: 102 mmol/L (ref 96–106)
Creatinine, Ser: 0.79 mg/dL (ref 0.76–1.27)
Globulin, Total: 2.9 g/dL (ref 1.5–4.5)
Glucose: 83 mg/dL (ref 70–99)
Potassium: 4.2 mmol/L (ref 3.5–5.2)
Sodium: 140 mmol/L (ref 134–144)
Total Protein: 7 g/dL (ref 6.0–8.5)
eGFR: 97 mL/min/{1.73_m2} (ref >59)

## 2024-02-04 LAB — LIPID PANEL
Chol/HDL Ratio: 2 ratio (ref 0.0–5.0)
Cholesterol, Total: 145 mg/dL (ref 100–199)
HDL: 73 mg/dL (ref >39)
LDL Chol Calc (NIH): 62 mg/dL (ref 0–99)
Triglycerides: 44 mg/dL (ref 0–149)
VLDL Cholesterol Cal: 10 mg/dL (ref 5–40)

## 2024-02-04 LAB — CBC
Hematocrit: 42.1 % (ref 37.5–51.0)
Hemoglobin: 14.2 g/dL (ref 13.0–17.7)
MCH: 29.8 pg (ref 26.6–33.0)
MCHC: 33.7 g/dL (ref 31.5–35.7)
MCV: 88 fL (ref 79–97)
Platelets: 167 10*3/uL (ref 150–450)
RBC: 4.76 x10E6/uL (ref 4.14–5.80)
RDW: 13.1 % (ref 11.6–15.4)
WBC: 3.2 10*3/uL — ABNORMAL LOW (ref 3.4–10.8)

## 2024-02-04 LAB — HEMOGLOBIN A1C
Est. average glucose Bld gHb Est-mCnc: 105 mg/dL
Hgb A1c MFr Bld: 5.3 % (ref 4.8–5.6)

## 2024-02-04 LAB — MICROALBUMIN / CREATININE URINE RATIO
Creatinine, Urine: 86 mg/dL
Microalb/Creat Ratio: 73 mg/g{creat} — ABNORMAL HIGH (ref 0–29)
Microalbumin, Urine: 62.8 ug/mL

## 2024-02-09 ENCOUNTER — Ambulatory Visit: Payer: Self-pay | Admitting: Gastroenterology

## 2024-02-13 ENCOUNTER — Other Ambulatory Visit: Payer: Self-pay

## 2024-02-26 DIAGNOSIS — E119 Type 2 diabetes mellitus without complications: Secondary | ICD-10-CM | POA: Diagnosis not present

## 2024-02-26 DIAGNOSIS — H40013 Open angle with borderline findings, low risk, bilateral: Secondary | ICD-10-CM | POA: Diagnosis not present

## 2024-03-03 ENCOUNTER — Other Ambulatory Visit: Payer: Self-pay

## 2024-03-09 ENCOUNTER — Ambulatory Visit (INDEPENDENT_AMBULATORY_CARE_PROVIDER_SITE_OTHER): Payer: Self-pay | Admitting: Family

## 2024-03-09 ENCOUNTER — Encounter: Payer: Self-pay | Admitting: Family

## 2024-03-09 VITALS — BP 129/84 | HR 64 | Temp 97.8°F | Resp 16 | Ht 67.0 in | Wt 157.0 lb

## 2024-03-09 DIAGNOSIS — Z1329 Encounter for screening for other suspected endocrine disorder: Secondary | ICD-10-CM | POA: Diagnosis not present

## 2024-03-09 DIAGNOSIS — Z0000 Encounter for general adult medical examination without abnormal findings: Secondary | ICD-10-CM

## 2024-03-09 DIAGNOSIS — E119 Type 2 diabetes mellitus without complications: Secondary | ICD-10-CM | POA: Diagnosis not present

## 2024-03-09 DIAGNOSIS — Z8639 Personal history of other endocrine, nutritional and metabolic disease: Secondary | ICD-10-CM | POA: Diagnosis not present

## 2024-03-09 NOTE — Progress Notes (Signed)
 Patient ID: Todd Greer, male    DOB: September 23, 1955  MRN: [id number]  CC: Annual Exam  Subjective: Todd Greer is a 68 y.o. male who presents for annual exam.   His concerns today include:  - Reports he is up to date on eye exam. - Up to date on colon cancer screening.  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 90 tablet 0   Blood Glucose Monitoring Suppl (ONE TOUCH ULTRA 2) w/Device KIT Use 4 (four) times daily -  before meals and at bedtime. 1 kit 0   diltiazem  (CARDIZEM  CD) 240 MG 24 hr capsule Take 1 capsule (240 mg total) by mouth daily. 90 capsule 2   ENTRESTO  49-51 MG Take 1 tablet by mouth 2 (two) times daily. 180 tablet 2   glucose blood (ONETOUCH ULTRA) test strip Check blood sugars 4 times a day before and after meals 100 each 12   Magnesium  400 MG  TABS Take 2 tablets by mouth. Name is Magnesium  Taurate     OneTouch Delica Lancets 33G MISC Check blood sugars 4 times a day before and after meals 100 each 12   spironolactone  (ALDACTONE ) 25 MG tablet Take 1 tablet (25 mg total) by mouth daily. 90 tablet 0   tadalafil  (CIALIS ) 20 MG tablet Take 20 mg by mouth daily as needed.     metFORMIN  (GLUCOPHAGE ) 1000 MG tablet Take 0.5 tablets (500 mg total) by mouth daily with breakfast. 30 tablet 1   No current facility-administered medications on file prior to visit.    Allergies  Allergen Reactions   Amlodipine  Other (See Comments)    Fatigue   Ace Inhibitors Cough    Tolerates ARB   Lisinopril  Cough    Social History   Socioeconomic History   Marital status: Married    Spouse name: Not on file   Number of children: Not on file   Years of education: Not on file   Highest education level: Not on file  Occupational History   Not on file  Tobacco Use   Smoking status: Former    Current packs/day: 0.00    Average packs/day: 0.5 packs/day for 10.0 years (5.0 ttl pk-yrs)    Types: Cigarettes    Start date: 10/03/1973    Quit date: 10/04/1983    Years since quitting: 40.4    Passive exposure: Past   Smokeless tobacco: Never  Vaping Use   Vaping status: Never Used  Substance and Sexual Activity   Alcohol use: Yes    Alcohol/week: 6.0 standard drinks of alcohol    Types: 6 Standard drinks or equivalent per week   Drug use: No   Sexual activity: Yes  Other Topics Concern   Not on file  Social History Narrative   His married father of 4.  His married for 28 years.  He lives with his wife and daughters.  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  Education: [employer].   He quit smoking in 1985.  He drinks 3 beers a week.   Prior to the onset of his current symptoms, used to work out with weights for at least 40 minutes a day for 3 times a week.  He usually did light weights for many occasions  as opposed to heavy weights.      Contacts: Wife Othel; daughter Zelda Sharps   Social Drivers of Health   Financial Resource Strain: Low Risk  (08/04/2023)   Overall Financial Resource Strain (CARDIA)    Difficulty of Paying Living Expenses: Not hard at all  Food Insecurity: No Food Insecurity (08/04/2023)   Hunger Vital Sign    Worried About Running Out of Food in the Last Year: Never true    Ran Out of Food in the Last Year: Never true  Transportation Needs: No Transportation Needs (08/04/2023)   PRAPARE - Administrator, Civil Service (Medical): No    Lack of Transportation (Non-Medical): No  Physical Activity: Not on file  Stress: No Stress Concern Present (08/04/2023)   [employer] of Occupational Health - Occupational Stress Questionnaire    Feeling of Stress : Not at all  Social Connections: Socially Integrated (02/03/2024)   Social Connection and Isolation Panel    Frequency of Communication with Friends and Family: More than three times a week    Frequency of Social Gatherings with Friends and Family: More than three times a week    Attends Religious Services: More than 4 times per year    Active Member of [employer] or Organizations: Yes    Attends Engineer, structural: More than 4 times per year    Marital Status: Married  Catering manager Violence: Not At Risk (08/04/2023)   Humiliation, Afraid, Rape, and Kick questionnaire    Fear of Current or Ex-Partner: No    Emotionally Abused: No    Physically Abused: No    Sexually Abused: No    Family History  Problem Relation Age of Onset   Cerebral aneurysm Mother        Died at a young age.   Stroke Father    Diabetes Father    Hypertension Father    Cancer Sister    Diabetes Paternal Grandmother    Hypertension Paternal Grandmother    Stroke Paternal Grandmother    Colon cancer Neg Hx    Rectal cancer Neg Hx    Stomach cancer Neg Hx    Esophageal cancer Neg Hx    Colon polyps Neg Hx      Past Surgical History:  Procedure Laterality Date   LEFT HEART CATH AND CORONARY ANGIOGRAPHY N/A 09/09/2017   Procedure: LEFT HEART CATH AND CORONARY ANGIOGRAPHY;  Surgeon: Levern Hutching, MD;  Location: [facility];  Service: Cardiovascular; EF 15 to 20%.  Diffuse HK.  Normal LVEDP.  Normal coronaries.   NM  EXERCISE MYOVIEW LTD  06/02/2013   Good exercise capacity.  Hypertensive response to exercise.  Dyspneic on exercise but no significant ST changes to suggest ischemia.  Normal stress images.  No ischemia or infarction   TRANSTHORACIC ECHOCARDIOGRAM  09/06/2017   EF 15 to 20%.  Diffuse HK.  Severe LA dilation.  Mild RV dilation.  Mild MR.    ROS: Review of Systems Negative except as stated above  PHYSICAL EXAM: BP 129/84   Pulse 64   Temp 97.8 F (36.6 C)   Resp (!) 99   Ht 5' 7 (1.702 m)   Wt 157 lb (71.2 kg)   HC 16 (40.6 cm)   BMI 24.59 kg/m   Physical Exam HENT:     Head: Normocephalic and atraumatic.     Right Ear: Tympanic membrane, ear canal and external ear normal.     Left Ear: Tympanic membrane, ear canal and external ear normal.     Nose: Nose normal.     Mouth/Throat:     Mouth: Mucous membranes are moist.     Pharynx: Oropharynx is clear.  Eyes:     Extraocular Movements: Extraocular movements intact.     Conjunctiva/sclera: Conjunctivae normal.     Pupils: Pupils are equal, round, and reactive to light.  Neck:     Thyroid: No thyroid mass, thyromegaly or thyroid tenderness.  Cardiovascular:     Rate and Rhythm: Normal rate and regular rhythm.     Pulses: Normal pulses.     Heart sounds: Normal heart sounds.  Pulmonary:     Effort: Pulmonary effort is normal.     Breath sounds: Normal breath sounds.  Abdominal:     General: Bowel sounds are normal.     Palpations: Abdomen is soft.  Genitourinary:    Comments: Patient declined. Musculoskeletal:        General: Normal range of motion.     Right shoulder: Normal.     Left shoulder:  Normal.     Right upper arm: Normal.     Left upper arm: Normal.     Right elbow: Normal.     Left elbow: Normal.     Right forearm: Normal.     Left forearm: Normal.     Right wrist: Normal.     Left wrist: Normal.     Right hand: Normal.     Left hand: Normal.     Cervical back: Normal, normal range of motion and neck supple.     Thoracic back: Normal.     Lumbar back: Normal.     Right hip: Normal.     Left hip: Normal.     Right upper leg: Normal.     Left upper leg: Normal.     Right knee: Normal.     Left knee: Normal.     Right lower leg: Normal.     Left lower leg: Normal.     Right ankle: Normal.     Left ankle: Normal.     Right foot: Normal.     Left foot: Normal.  Skin:    General: Skin is warm and dry.     Capillary Refill: Capillary refill takes less than 2 seconds.  Neurological:     General: No focal deficit present.     Mental Status: He is alert and oriented to person, place, and time.  Psychiatric:        Mood and Affect: Mood normal.        Behavior: Behavior normal.  ASSESSMENT AND PLAN: 1. Annual physical exam (Primary) - Counseled on 150 minutes of exercise per week as tolerated, healthy eating (including decreased daily intake of saturated fats, cholesterol, added sugars, sodium), STI prevention, and routine healthcare maintenance.  2. Thyroid disorder screen - Routine screening.  - TSH  3. History of type 2 diabetes mellitus - Diet and exercise controlled.  - Patient not prescribed pharmacological therapy. - Routine screening.  - Discussed the importance of healthy eating habits, low-carbohydrate diet, low-sugar diet, and regular aerobic exercise (at least 150 minutes a week as tolerated) to achieve or maintain control of diabetes. - Follow-up with primary provider as scheduled. - Hemoglobin A1c  4. Diabetic eye exam (HCC) - Patient declined.  Patient was given the opportunity to ask questions.  Patient verbalized understanding of  the plan and was able to repeat key elements of the plan. Patient was given clear instructions to go to Emergency Department or return to medical center if symptoms don't improve, worsen, or new problems develop.The patient verbalized understanding.   Orders Placed This Encounter  Procedures   TSH   Hemoglobin A1c    Return in about 1 year (around 03/09/2025) for Physical per patient preference.  Greig JINNY Drones, NP

## 2024-03-10 ENCOUNTER — Ambulatory Visit: Payer: Self-pay | Admitting: Family

## 2024-03-10 LAB — HEMOGLOBIN A1C
Est. average glucose Bld gHb Est-mCnc: 111 mg/dL
Hgb A1c MFr Bld: 5.5 % (ref 4.8–5.6)

## 2024-03-10 LAB — TSH: TSH: 3.28 u[IU]/mL (ref 0.450–4.500)

## 2024-04-06 ENCOUNTER — Other Ambulatory Visit: Payer: Self-pay

## 2024-04-08 ENCOUNTER — Other Ambulatory Visit: Payer: Self-pay

## 2024-04-15 ENCOUNTER — Other Ambulatory Visit: Payer: Self-pay | Admitting: Urology

## 2024-04-15 DIAGNOSIS — N529 Male erectile dysfunction, unspecified: Secondary | ICD-10-CM

## 2024-04-20 ENCOUNTER — Other Ambulatory Visit: Payer: Self-pay

## 2024-04-21 ENCOUNTER — Ambulatory Visit (INDEPENDENT_AMBULATORY_CARE_PROVIDER_SITE_OTHER): Admitting: Urology

## 2024-04-21 ENCOUNTER — Other Ambulatory Visit: Payer: Self-pay

## 2024-04-21 ENCOUNTER — Encounter: Payer: Self-pay | Admitting: Urology

## 2024-04-21 VITALS — BP 131/82 | HR 82 | Ht 67.0 in | Wt 157.0 lb

## 2024-04-21 DIAGNOSIS — N138 Other obstructive and reflux uropathy: Secondary | ICD-10-CM

## 2024-04-21 DIAGNOSIS — N401 Enlarged prostate with lower urinary tract symptoms: Secondary | ICD-10-CM | POA: Diagnosis not present

## 2024-04-21 DIAGNOSIS — R351 Nocturia: Secondary | ICD-10-CM

## 2024-04-21 DIAGNOSIS — N529 Male erectile dysfunction, unspecified: Secondary | ICD-10-CM | POA: Diagnosis not present

## 2024-04-21 LAB — URINALYSIS, ROUTINE W REFLEX MICROSCOPIC
Bilirubin, UA: NEGATIVE
Glucose, UA: NEGATIVE
Ketones, UA: NEGATIVE
Leukocytes,UA: NEGATIVE
Nitrite, UA: NEGATIVE
RBC, UA: NEGATIVE
Specific Gravity, UA: 1.02 (ref 1.005–1.030)
Urobilinogen, Ur: 0.2 mg/dL (ref 0.2–1.0)
pH, UA: 6 (ref 5.0–7.5)

## 2024-04-21 LAB — MICROSCOPIC EXAMINATION: Bacteria, UA: NONE SEEN

## 2024-04-21 LAB — BLADDER SCAN AMB NON-IMAGING

## 2024-04-21 MED ORDER — GEMTESA 75 MG PO TABS: 75.0000 mg | ORAL_TABLET | Freq: Every day | ORAL | Status: DC

## 2024-04-21 MED ORDER — TADALAFIL 20 MG PO TABS: 20.0000 mg | ORAL_TABLET | Freq: Every day | ORAL | 11 refills | Status: AC | PRN

## 2024-04-21 NOTE — Progress Notes (Signed)
 Assessment: 1. BPH with obstruction/lower urinary tract symptoms   2. Nocturia   3. Organic impotence    Plan: PSA today Trial of Gemtesa  75 mg daily.  Samples provided.  Use and side effects discussed. Continue tadalafil  20 mg as needed. Return to office in 1 month.  Chief Complaint:  Chief Complaint  Patient presents with   Erectile Dysfunction    History of Present Illness:  Todd Greer is a 68 y.o. male who is seen for continued evaluation of lower urinary tract symptoms.  He was initially seen in March 2024 for erectile dysfunction. He reported gradual onset of erectile dysfunction for approximately 2 years.  He has been able to achieve only a partial erection with 30 to 40% rigidity and difficulty maintaining his erections as well.  He has been unable to engage in intercourse due to the poor rigidity.  No pain or curvature with erections.  No nocturnal or early morning erections.  No decrease in his libido.  No prior treatment.  He reported urinary symptoms including frequency, urgency, nocturia, intermittent stream, and hesitancy.  The symptoms had been present for 5-6 years.  No dysuria or gross hematuria. IPSS = 16.  He was given a trial of tadalafil  5 mg daily at his visit in March 2024.  He did not see a significant improvement in his erectile dysfunction with the daily tadalafil .  He continued to have partial erections which were not adequate for intercourse.  He also noted rapid detumescence.  No pain or curvature with erections.  He reported better results with the tadalafil  20 mg as needed.  He would like to try this again. His lower urinary tract symptoms were unchanged.  He continued to have frequency urgency, and sensation of incomplete emptying.  No dysuria or gross hematuria. IPSS = 20.  PSA 5/24: 1.3  At his visit in July 2024, he did not see any improvement in his erectile dysfunction with the tadalafil  20 mg as needed.  His lower urinary tract symptoms  remained stable.  He continued with some decreased stream and urgency.  No dysuria or gross hematuria. IPSS = 23.   He presents today for evaluation of worsening lower urinary tract symptoms.  His main complaint is nocturia 3-4 times.  No daytime frequency.  He does have some urgency.  He reports a intermittently weak stream and sensation of incomplete emptying.  No dysuria or gross hematuria. IPSS = 21/4. He continues to use tadalafil  20 mg as needed for ED with satisfactory results.  Portions of the above documentation were copied from a prior visit for review purposes only.  Past Medical History:  Past Medical History:  Diagnosis Date   Anxiety    Chronic combined systolic and diastolic congestive heart failure, NYHA class 2 (HCC) 09/2017   Has been maintained on excellent medications: Carvedilol  12.5 mg BID, Entresto  49 -51 mg p.o. BID, empagliflozin  10 mg daily, spironolactone  25 mg daily.   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  Mild RV dilation.  Mild MR.  Catheterization showed EF 25% normal EDP and normal coronaries (has been followed by Dr. Levern)   Obesity, Class II, BMI 35-39.9, with comorbidity    Obesity, diabetes, and hypertension syndrome (HCC)    Substance abuse (HCC)  Past Surgical History:  Past Surgical History:  Procedure Laterality Date   LEFT HEART CATH AND CORONARY ANGIOGRAPHY N/A 09/09/2017   Procedure: LEFT HEART CATH AND CORONARY ANGIOGRAPHY;  Surgeon: Levern Hutching, MD;  Location: [facility];  Service: Cardiovascular; EF 15 to 20%.  Diffuse HK.  Normal LVEDP.  Normal coronaries.   NM  EXERCISE MYOVIEW LTD  06/02/2013   Good exercise capacity.  Hypertensive response to exercise.  Dyspneic on exercise but no significant ST changes to suggest  ischemia.  Normal stress images.  No ischemia or infarction   TRANSTHORACIC ECHOCARDIOGRAM  09/06/2017   EF 15 to 20%.  Diffuse HK.  Severe LA dilation.  Mild RV dilation.  Mild MR.    Allergies:  Allergies  Allergen Reactions   Amlodipine  Other (See Comments)    Fatigue   Ace Inhibitors Cough    Tolerates ARB   Lisinopril  Cough    Family History:  Family History  Problem Relation Age of Onset   Cerebral aneurysm Mother        Died at a young age.   Stroke Father    Diabetes Father    Hypertension Father    Cancer Sister    Diabetes Paternal Grandmother    Hypertension Paternal Grandmother    Stroke Paternal Grandmother    Colon cancer Neg Hx    Rectal cancer Neg Hx    Stomach cancer Neg Hx    Esophageal cancer Neg Hx    Colon polyps Neg Hx     Social History:  Social History   Tobacco Use   Smoking status: Former    Current packs/day: 0.00    Average packs/day: 0.5 packs/day for 10.0 years (5.0 ttl pk-yrs)    Types: Cigarettes    Start date: 10/03/1973    Quit date: 10/04/1983    Years since quitting: 40.5    Passive exposure: Past   Smokeless tobacco: Never  Vaping Use   Vaping status: Never Used  Substance Use Topics   Alcohol use: Yes    Alcohol/week: 6.0 standard drinks of alcohol    Types: 6 Standard drinks or equivalent per week   Drug use: No    ROS: Constitutional:  Negative for fever, chills, weight loss CV: Negative for chest pain, previous MI, hypertension Respiratory:  Negative for shortness of breath, wheezing, sleep apnea, frequent cough GI:  Negative for nausea, vomiting, bloody stool, GERD  Physical exam: BP 131/82   Pulse 82   Ht 5' 7 (1.702 m)   Wt 157 lb (71.2 kg)   BMI 24.59 kg/m  GENERAL APPEARANCE:  Well appearing, well developed, well nourished, NAD HEENT:  Atraumatic, normocephalic, oropharynx clear NECK:  Supple without lymphadenopathy or thyromegaly ABDOMEN:  Soft, non-tender, no masses EXTREMITIES:  Moves all  extremities well, without clubbing, cyanosis, or edema NEUROLOGIC:  Alert and oriented x 3, normal gait, CN II-XII grossly intact MENTAL STATUS:  appropriate BACK:  Non-tender to palpation, No CVAT SKIN:  Warm, dry, and intact GU: Prostate: 40 g, NT, no nodules Rectum: Normal tone,  no masses or tenderness   Results: U/A: 0-5 WBC, 0-2 RBC  PVR = 27 ml

## 2024-04-22 ENCOUNTER — Ambulatory Visit: Payer: Self-pay | Admitting: Urology

## 2024-04-22 LAB — PSA: Prostate Specific Ag, Serum: 1.4 ng/mL (ref 0.0–4.0)

## 2024-04-23 ENCOUNTER — Other Ambulatory Visit: Payer: Self-pay

## 2024-04-26 ENCOUNTER — Other Ambulatory Visit: Payer: Self-pay

## 2024-04-28 ENCOUNTER — Other Ambulatory Visit: Payer: Self-pay

## 2024-05-04 ENCOUNTER — Other Ambulatory Visit: Payer: Self-pay

## 2024-05-06 ENCOUNTER — Other Ambulatory Visit: Payer: Self-pay

## 2024-05-26 ENCOUNTER — Ambulatory Visit: Admitting: Urology

## 2024-06-14 ENCOUNTER — Ambulatory Visit: Admitting: Urology

## 2024-06-14 NOTE — Progress Notes (Deleted)
 Assessment: 1. BPH with obstruction/lower urinary tract symptoms   2. Nocturia   3. Organic impotence     Plan: PSA today Trial of Gemtesa  75 mg daily.  Samples provided.  Use and side effects discussed. Continue tadalafil  20 mg as needed. Return to office in 1 month.  Chief Complaint:  No chief complaint on file.   History of Present Illness:  Todd Greer is a 68 y.o. male who is seen for continued evaluation of lower urinary tract symptoms.  He was initially seen in March 2024 for erectile dysfunction. He reported gradual onset of erectile dysfunction for approximately 2 years.  He has been able to achieve only a partial erection with 30 to 40% rigidity and difficulty maintaining his erections as well.  He has been unable to engage in intercourse due to the poor rigidity.  No pain or curvature with erections.  No nocturnal or early morning erections.  No decrease in his libido.  No prior treatment.  He reported urinary symptoms including frequency, urgency, nocturia, intermittent stream, and hesitancy.  The symptoms had been present for 5-6 years.  No dysuria or gross hematuria. IPSS = 16.  He was given a trial of tadalafil  5 mg daily at his visit in March 2024.  He did not see a significant improvement in his erectile dysfunction with the daily tadalafil .  He continued to have partial erections which were not adequate for intercourse.  He also noted rapid detumescence.  No pain or curvature with erections.  He reported better results with the tadalafil  20 mg as needed.  He would like to try this again. His lower urinary tract symptoms were unchanged.  He continued to have frequency urgency, and sensation of incomplete emptying.  No dysuria or gross hematuria. IPSS = 20.  PSA 5/24: 1.3  At his visit in July 2024, he did not see any improvement in his erectile dysfunction with the tadalafil  20 mg as needed.  His lower urinary tract symptoms remained stable.  He continued with  some decreased stream and urgency.  No dysuria or gross hematuria. IPSS = 23.   He was seen in August 2025 for evaluation of worsening lower urinary tract symptoms.  His main complaint was nocturia 3-4 times.  No daytime frequency.  He reported some urgency, intermittently weak stream and sensation of incomplete emptying.  No dysuria or gross hematuria. IPSS = 21/4. He continued to use tadalafil  20 mg as needed for ED with satisfactory results.  PSA 8/25: 1.4  He was given a trial of Gemtesa  75 mg daily.   Portions of the above documentation were copied from a prior visit for review purposes only.  Past Medical History:  Past Medical History:  Diagnosis Date   Anxiety    Chronic combined systolic and diastolic congestive heart failure, NYHA class 2 (HCC) 09/2017   Has been maintained on excellent medications: Carvedilol  12.5 mg BID, Entresto  49 -51 mg p.o. BID, empagliflozin  10 mg daily, spironolactone  25 mg daily.   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  Mild RV dilation.  Mild MR.  Catheterization showed EF 25% normal EDP and normal coronaries (has been followed by Dr. Levern)   Obesity, Class II, BMI 35-39.9, with comorbidity    Obesity, diabetes, and hypertension syndrome (HCC)  Substance abuse (HCC)     Past Surgical History:  Past Surgical History:  Procedure Laterality Date   LEFT HEART CATH AND CORONARY ANGIOGRAPHY N/A 09/09/2017   Procedure: LEFT HEART CATH AND CORONARY ANGIOGRAPHY;  Surgeon: Levern Hutching, MD;  Location: [facility];  Service: Cardiovascular; EF 15 to 20%.  Diffuse HK.  Normal LVEDP.  Normal coronaries.   NM  EXERCISE MYOVIEW LTD  06/02/2013   Good exercise capacity.  Hypertensive response to exercise.  Dyspneic on exercise but no significant ST  changes to suggest ischemia.  Normal stress images.  No ischemia or infarction   TRANSTHORACIC ECHOCARDIOGRAM  09/06/2017   EF 15 to 20%.  Diffuse HK.  Severe LA dilation.  Mild RV dilation.  Mild MR.    Allergies:  Allergies  Allergen Reactions   Amlodipine  Other (See Comments)    Fatigue   Ace Inhibitors Cough    Tolerates ARB   Lisinopril  Cough    Family History:  Family History  Problem Relation Age of Onset   Cerebral aneurysm Mother        Died at a young age.   Stroke Father    Diabetes Father    Hypertension Father    Cancer Sister    Diabetes Paternal Grandmother    Hypertension Paternal Grandmother    Stroke Paternal Grandmother    Colon cancer Neg Hx    Rectal cancer Neg Hx    Stomach cancer Neg Hx    Esophageal cancer Neg Hx    Colon polyps Neg Hx     Social History:  Social History   Tobacco Use   Smoking status: Former    Current packs/day: 0.00    Average packs/day: 0.5 packs/day for 10.0 years (5.0 ttl pk-yrs)    Types: Cigarettes    Start date: 10/03/1973    Quit date: 10/04/1983    Years since quitting: 40.7    Passive exposure: Past   Smokeless tobacco: Never  Vaping Use   Vaping status: Never Used  Substance Use Topics   Alcohol use: Yes    Alcohol/week: 6.0 standard drinks of alcohol    Types: 6 Standard drinks or equivalent per week   Drug use: No    ROS: Constitutional:  Negative for fever, chills, weight loss CV: Negative for chest pain, previous MI, hypertension Respiratory:  Negative for shortness of breath, wheezing, sleep apnea, frequent cough GI:  Negative for nausea, vomiting, bloody stool, GERD  Physical exam: There were no vitals taken for this visit. GENERAL APPEARANCE:  Well appearing, well developed, well nourished, NAD HEENT:  Atraumatic, normocephalic, oropharynx clear NECK:  Supple without lymphadenopathy or thyromegaly ABDOMEN:  Soft, non-tender, no masses EXTREMITIES:  Moves all extremities well, without clubbing,  cyanosis, or edema NEUROLOGIC:  Alert and oriented x 3, normal gait, CN II-XII grossly intact MENTAL STATUS:  appropriate BACK:  Non-tender to palpation, No CVAT SKIN:  Warm, dry, and intact   Results: U/A:

## 2024-07-15 ENCOUNTER — Other Ambulatory Visit: Payer: Self-pay

## 2024-08-11 ENCOUNTER — Encounter (HOSPITAL_COMMUNITY): Payer: Self-pay

## 2024-08-11 ENCOUNTER — Emergency Department (HOSPITAL_COMMUNITY)
Admission: EM | Admit: 2024-08-11 | Discharge: 2024-08-11 | Disposition: A | Discharge status: Home / Self Care | Attending: Emergency Medicine | Admitting: Emergency Medicine

## 2024-08-11 ENCOUNTER — Emergency Department (HOSPITAL_COMMUNITY)

## 2024-08-11 DIAGNOSIS — K219 Gastro-esophageal reflux disease without esophagitis: Secondary | ICD-10-CM | POA: Diagnosis not present

## 2024-08-11 DIAGNOSIS — R1013 Epigastric pain: Secondary | ICD-10-CM | POA: Diagnosis present

## 2024-08-11 LAB — I-STAT CHEM 8, ED
BUN: 14 mg/dL (ref 8–23)
Calcium, Ion: 1.16 mmol/L (ref 1.15–1.40)
Chloride: 102 mmol/L (ref 98–111)
Creatinine, Ser: 0.9 mg/dL (ref 0.61–1.24)
Glucose, Bld: 150 mg/dL — ABNORMAL HIGH (ref 70–99)
HCT: 44 % (ref 39.0–52.0)
Hemoglobin: 15 g/dL (ref 13.0–17.0)
Potassium: 4.2 mmol/L (ref 3.5–5.1)
Sodium: 136 mmol/L (ref 135–145)
TCO2: 25 mmol/L (ref 22–32)

## 2024-08-11 LAB — BASIC METABOLIC PANEL WITH GFR
Anion gap: 11 (ref 5–15)
BUN: 13 mg/dL (ref 8–23)
CO2: 23 mmol/L (ref 22–32)
Calcium: 9.6 mg/dL (ref 8.9–10.3)
Chloride: 101 mmol/L (ref 98–111)
Creatinine, Ser: 0.97 mg/dL (ref 0.61–1.24)
GFR, Estimated: >60 mL/min (ref >60)
Glucose, Bld: 153 mg/dL — ABNORMAL HIGH (ref 70–99)
Potassium: 4.2 mmol/L (ref 3.5–5.1)
Sodium: 135 mmol/L (ref 135–145)

## 2024-08-11 LAB — CBC
HCT: 42.7 % (ref 39.0–52.0)
Hemoglobin: 14.3 g/dL (ref 13.0–17.0)
MCH: 28.2 pg (ref 26.0–34.0)
MCHC: 33.5 g/dL (ref 30.0–36.0)
MCV: 84.2 fL (ref 80.0–100.0)
Platelets: 236 K/uL (ref 150–400)
RBC: 5.07 MIL/uL (ref 4.22–5.81)
RDW: 13.6 % (ref 11.5–15.5)
WBC: 4.7 K/uL (ref 4.0–10.5)
nRBC: 0 % (ref 0.0–0.2)

## 2024-08-11 LAB — RESP PANEL BY RT-PCR (RSV, FLU A&B, COVID)  RVPGX2
Influenza A by PCR: NEGATIVE
Influenza B by PCR: NEGATIVE
Resp Syncytial Virus by PCR: NEGATIVE
SARS Coronavirus 2 by RT PCR: NEGATIVE

## 2024-08-11 LAB — TROPONIN T, HIGH SENSITIVITY
Troponin T High Sensitivity: <15 ng/L (ref 0–19)
Troponin T High Sensitivity: <15 ng/L (ref 0–19)

## 2024-08-11 MED ORDER — OMEPRAZOLE 40 MG PO CPDR: 40.0000 mg | DELAYED_RELEASE_CAPSULE | Freq: Every day | ORAL | 0 refills | Status: DC

## 2024-08-11 MED ORDER — ALUM & MAG HYDROXIDE-SIMETH 200-200-20 MG/5ML PO SUSP
30.0000 mL | Freq: Once | ORAL | Status: AC
  Administered 2024-08-11: 30 mL via ORAL
  Filled 2024-08-11: qty 30

## 2024-08-11 NOTE — ED Provider Notes (Signed)
 Todd Greer EMERGENCY DEPARTMENT AT [facility] Provider Note   CSN: [id number] Arrival date & time: 08/11/24  0126     Patient presents with: Chest Pain   Todd Greer is a 68 y.o. male.   The history is provided by the patient.  Chest Pain Pain location:  Epigastric Pain quality: dull and pressure   Pain radiates to:  Does not radiate Pain severity:  Moderate Onset quality:  Gradual Duration:  1 week Progression:  Waxing and waning (worst at night lying flat. has associated indigestion) Chronicity:  New Context: not lifting   Relieved by:  Nothing Worsened by:  Nothing Ineffective treatments:  None tried Associated symptoms: no fever, no shortness of breath, no syncope, no vomiting and no weakness   Risk factors: not obese        Prior to Admission medications   Medication Sig Start Date End Date Taking? Authorizing Provider  omeprazole (PRILOSEC) 40 MG capsule Take 1 capsule (40 mg total) by mouth daily. 08/11/24  Yes Kacelyn Rowzee, MD  atorvastatin  (LIPITOR) 20 MG tablet Take 1 tablet (20 mg total) by mouth daily. 01/21/24   Jaycee Greig PARAS, NP  Blood Glucose Monitoring Suppl (ONE TOUCH ULTRA 2) w/Device KIT Use 4 (four) times daily -  before meals and at bedtime. 01/06/24   Jaycee Greig PARAS, NP  diltiazem  (CARDIZEM  CD) 240 MG 24 hr capsule Take 1 capsule (240 mg total) by mouth daily. 01/06/24   Mealor, Augustus E, MD  ENTRESTO  49-51 MG Take 1 tablet by mouth 2 (two) times daily. 01/14/24   Anner Alm ORN, MD  glucose blood [facility] ULTRA) test strip Check blood sugars 4 times a day before and after meals 05/01/23   Jaycee Greig PARAS, NP  Magnesium  400 MG TABS Take 2 tablets by mouth. Name is Magnesium  Taurate    [provider]  OneTouch Delica Lancets 33G MISC Check blood sugars 4 times a day before and after meals 08/15/21   Jaycee Greig PARAS, NP  spironolactone  (ALDACTONE ) 25 MG tablet Take 1 tablet (25 mg total) by mouth daily. 01/23/24   Tanda Bleacher, MD   tadalafil  (CIALIS ) 20 MG tablet Take 1 tablet (20 mg total) by mouth daily as needed. 04/21/24   Stoneking, Adine PARAS., MD  Vibegron  (GEMTESA ) 75 MG TABS Take 1 tablet (75 mg total) by mouth daily. 04/21/24   Stoneking, Adine PARAS., MD    Allergies: Amlodipine , Ace inhibitors, and Lisinopril     Review of Systems  Constitutional:  Negative for fever.  Respiratory:  Negative for shortness of breath.   Cardiovascular:  Positive for chest pain. Negative for syncope.  Gastrointestinal:  Negative for vomiting.  Neurological:  Negative for weakness.  All other systems reviewed and are negative.   Updated Vital Signs BP 122/70   Pulse 60   Temp 98.4 F (36.9 C) (Oral)   Resp 17   Ht 5' 7 (1.702 m)   Wt 72.6 kg   SpO2 98%   BMI 25.06 kg/m   Physical Exam Vitals and nursing note reviewed.  Constitutional:      General: He is not in acute distress.    Appearance: Normal appearance. He is well-developed. He is not diaphoretic.  HENT:     Head: Normocephalic and atraumatic.     Nose: Nose normal.  Eyes:     Conjunctiva/sclera: Conjunctivae normal.     Pupils: Pupils are equal, round, and reactive to light.  Cardiovascular:     Rate and  Rhythm: Normal rate and regular rhythm.     Pulses: Normal pulses.     Heart sounds: Normal heart sounds.  Pulmonary:     Effort: Pulmonary effort is normal.     Breath sounds: Normal breath sounds. No wheezing or rales.  Abdominal:     General: Bowel sounds are normal.     Palpations: Abdomen is soft.     Tenderness: There is no abdominal tenderness. There is no guarding or rebound.  Musculoskeletal:        General: Normal range of motion.     Cervical back: Normal range of motion and neck supple.  Skin:    General: Skin is warm and dry.     Capillary Refill: Capillary refill takes less than 2 seconds.  Neurological:     General: No focal deficit present.     Mental Status: He is alert and oriented to person, place, and time.     Deep  Tendon Reflexes: Reflexes normal.  Psychiatric:        Mood and Affect: Mood normal.     (all labs ordered are listed, but only abnormal results are displayed) Results for orders placed or performed during the hospital encounter of 08/11/24  Basic metabolic panel   Collection Time: 08/11/24  1:47 AM  Result Value Ref Range   Sodium 135 135 - 145 mmol/L   Potassium 4.2 3.5 - 5.1 mmol/L   Chloride 101 98 - 111 mmol/L   CO2 23 22 - 32 mmol/L   Glucose, Bld 153 (H) 70 - 99 mg/dL   BUN 13 8 - 23 mg/dL   Creatinine, Ser 9.02 0.61 - 1.24 mg/dL   Calcium  9.6 8.9 - 10.3 mg/dL   GFR, Estimated >39 >39 mL/min   Anion gap 11 5 - 15  CBC   Collection Time: 08/11/24  1:47 AM  Result Value Ref Range   WBC 4.7 4.0 - 10.5 K/uL   RBC 5.07 4.22 - 5.81 MIL/uL   Hemoglobin 14.3 13.0 - 17.0 g/dL   HCT 57.2 60.9 - 47.9 %   MCV 84.2 80.0 - 100.0 fL   MCH 28.2 26.0 - 34.0 pg   MCHC 33.5 30.0 - 36.0 g/dL   RDW 86.3 88.4 - 84.4 %   Platelets 236 150 - 400 K/uL   nRBC 0.0 0.0 - 0.2 %  Troponin T, High Sensitivity   Collection Time: 08/11/24  1:47 AM  Result Value Ref Range   Troponin T High Sensitivity <15 0 - 19 ng/L  I-stat chem 8, ED (not at [facility], [facility] or [facility])   Collection Time: 08/11/24  2:00 AM  Result Value Ref Range   Sodium 136 135 - 145 mmol/L   Potassium 4.2 3.5 - 5.1 mmol/L   Chloride 102 98 - 111 mmol/L   BUN 14 8 - 23 mg/dL   Creatinine, Ser 9.09 0.61 - 1.24 mg/dL   Glucose, Bld 849 (H) 70 - 99 mg/dL   Calcium , Ion 1.16 1.15 - 1.40 mmol/L   TCO2 25 22 - 32 mmol/L   Hemoglobin 15.0 13.0 - 17.0 g/dL   HCT 55.9 60.9 - 47.9 %  Resp panel by RT-PCR (RSV, Flu A&B, Covid) Anterior Nasal Swab   Collection Time: 08/11/24  2:38 AM   Specimen: Anterior Nasal Swab  Result Value Ref Range   SARS Coronavirus 2 by RT PCR NEGATIVE NEGATIVE   Influenza A by PCR NEGATIVE NEGATIVE   Influenza B by PCR NEGATIVE NEGATIVE  Resp Syncytial Virus by PCR NEGATIVE NEGATIVE  Troponin T, High  Sensitivity   Collection Time: 08/11/24  3:16 AM  Result Value Ref Range   Troponin T High Sensitivity <15 0 - 19 ng/L   DG Chest 2 View Result Date: 08/11/2024 EXAM: 2 VIEW(S) XRAY OF THE CHEST 08/11/2024 01:56:00 AM COMPARISON: CXR 07/06/2021. CLINICAL HISTORY: chest pain FINDINGS: LUNGS AND PLEURA: No focal pulmonary opacity. No pleural effusion. No pneumothorax. HEART AND MEDIASTINUM: No acute abnormality of the cardiac and mediastinal silhouettes. BONES AND SOFT TISSUES: No acute osseous abnormality. IMPRESSION: 1. No acute cardiopulmonary pathology. Electronically signed by: Morgane Naveau MD 08/11/2024 02:00 AM EST RP Workstation: [id number]     EKG: EKG Interpretation Date/Time:  Wednesday August 11 2024 01:35:50 EST Ventricular Rate:  90 PR Interval:  180 QRS Duration:  106 QT Interval:  383 QTC Calculation: 469 R Axis:   -58  Text Interpretation: Sinus rhythm Premature ventricular complexes Consider left atrial enlargement Left anterior fascicular block Confirmed by Nettie, Kaja Jackowski ([id number]) on 08/11/2024 2:25:58 AM  Radiology: DG Chest 2 View Result Date: 08/11/2024 EXAM: 2 VIEW(S) XRAY OF THE CHEST 08/11/2024 01:56:00 AM COMPARISON: CXR 07/06/2021. CLINICAL HISTORY: chest pain FINDINGS: LUNGS AND PLEURA: No focal pulmonary opacity. No pleural effusion. No pneumothorax. HEART AND MEDIASTINUM: No acute abnormality of the cardiac and mediastinal silhouettes. BONES AND SOFT TISSUES: No acute osseous abnormality. IMPRESSION: 1. No acute cardiopulmonary pathology. Electronically signed by: Morgane Naveau MD 08/11/2024 02:00 AM EST RP Workstation: [id number]     Procedures   Medications Ordered in the ED  alum & mag hydroxide-simeth (MAALOX/MYLANTA) 200-200-20 MG/5ML suspension 30 mL (30 mLs Oral Given 08/11/24 0239)                                    Medical Decision Making Indigestion and SOB lying flat   Amount and/or Complexity of Data Reviewed External Data  Reviewed: labs, ECG and notes.    Details: Previous notes reviewed from previous visits  Labs: ordered.    Details: Negative covid and flu, 2 negative troponins < 15.  Normal sodium 135, normal potassium 4.2, normal creatinine 0.97, normal white count 4.7, normal hemoglobin 14.3, normal platelets.   Radiology: ordered.    Details: Normal CXR  ECG/medicine tests: ordered and independent interpretation performed. Decision-making details documented in ED Course.  Risk OTC drugs. Prescription drug management. Risk Details: Very well appearing.  History and exam are consistent with GERD.  Symptoms resolved post GI cocktail consistent with GERD.  Ruled out for Mi in the ED. I do not believe this is a PE.  No signs of PNA.  Stable for discharge with close follow up.  Strict returns     Final diagnoses:  Gastroesophageal reflux disease, unspecified whether esophagitis present   No signs of systemic illness or infection. The patient is nontoxic-appearing on exam and vital signs are within normal limits.  I have reviewed the triage vital signs and the nursing notes. Pertinent labs & imaging results that were available during my care of the patient were reviewed by me and considered in my medical decision making (see chart for details). After history, exam, and medical workup I feel the patient has been appropriately medically screened and is safe for discharge home. Pertinent diagnoses were discussed with the patient. Patient was given return precautions.     ED Discharge Orders  Ordered    omeprazole (PRILOSEC) 40 MG capsule  Daily        08/11/24 0355               Santa Abdelrahman, MD 08/11/24 [phone]

## 2024-08-11 NOTE — ED Triage Notes (Addendum)
 Patient reports heaviness in his chest that started 1 week ago. He stats the pain is worse. States the pain starts in the front of the chest and radiates to the back. States he feels moe short of breath now. States he thought it was indigestion and too pepto bismol with no relief. Patient take cardizem  and entresto . History of CHF. Rates pain 6/10 in the center of hte chest that radiates to the back.

## 2024-08-23 ENCOUNTER — Ambulatory Visit: Admitting: Gastroenterology

## 2024-08-23 ENCOUNTER — Encounter: Payer: Self-pay | Admitting: Gastroenterology

## 2024-08-23 VITALS — BP 112/64 | HR 64 | Ht 67.0 in | Wt 158.2 lb

## 2024-08-23 DIAGNOSIS — K219 Gastro-esophageal reflux disease without esophagitis: Secondary | ICD-10-CM | POA: Insufficient documentation

## 2024-08-23 DIAGNOSIS — R1013 Epigastric pain: Secondary | ICD-10-CM | POA: Insufficient documentation

## 2024-08-23 MED ORDER — OMEPRAZOLE 40 MG PO CPDR: 40.0000 mg | DELAYED_RELEASE_CAPSULE | Freq: Every day | ORAL | 3 refills | Status: AC

## 2024-08-23 MED ORDER — SUCRALFATE 1 GM/10ML PO SUSP: 1.0000 g | Freq: Four times a day (QID) | ORAL | 1 refills | Status: AC

## 2024-08-23 NOTE — Patient Instructions (Addendum)
 We have sent the following medications to your pharmacy for you to pick up at your convenience: Carafate  10 ml four times daily 20-30 minutes before meals and at bedtime.  Omeprazole  40 mg daily 30-60 minutes before breakfast.  You have been scheduled for an endoscopy. Please follow written instructions given to you at your visit today.  If you use inhalers (even only as needed), please bring them with you on the day of your procedure.  If you take any of the following medications, they will need to be adjusted prior to your procedure:   DO NOT TAKE 7 DAYS PRIOR TO TEST- Trulicity (dulaglutide) Ozempic, Wegovy (semaglutide) Mounjaro, Zepbound (tirzepatide) Bydureon Bcise (exanatide extended release)  DO NOT TAKE 1 DAY PRIOR TO YOUR TEST Rybelsus (semaglutide) Adlyxin (lixisenatide) Victoza (liraglutide) Byetta (exanatide) ___________________________________________________________________________

## 2024-08-23 NOTE — Progress Notes (Signed)
 "    08/23/2024 Todd Greer [id number] Jun 28, 1956   Discussed the use of AI scribe software for clinical note transcription with the patient, who gave verbal consent to proceed.  History of Present Illness Todd Greer is a 68 year old male who presents with three weeks of upper abdominal pain and burning.  For three weeks, he has experienced persistent pain and burning localized above the umbilicus and into the chest, with radiation to the back when lying down. The discomfort began higher in the abdomen and has shifted to the upper central region. Symptoms occur even when not eating and are worsened by food or drink, resulting in burning, early satiety, and frequent nausea. Even small amounts of food or liquid cause fullness and discomfort.  He visited the emergency room on August 11, 2024 for these symptoms and was started on omeprazole  40 mg daily, which he has taken daily since. He had not previously used medications for heartburn, reflux, or indigestion. Occasional heartburn and regurgitation of acid have occurred in the past, but were infrequent and self-limited. Maalox provided no relief. He does not use NSAIDs or other over-the-counter pain medications, using only Tylenol  as needed.  He has a longstanding difficulty swallowing pills, unchanged and not affecting food. No new or worsening dysphagia or sensation of food getting stuck.  Bowel movements have been slower than usual, but he continues to have bowel movements and pass gas. Colonoscopy in April 2025 was unremarkable.   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 BID, empagliflozin  10 mg daily, spironolactone  25 mg daily.   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  Mild RV dilation.  Mild MR.  Catheterization showed EF 25% normal EDP and normal coronaries (has been followed by Dr. 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  Diffuse HK.  Normal LVEDP.  Normal coronaries.   NM  EXERCISE MYOVIEW LTD  06/02/2013   Good exercise capacity.  Hypertensive response to exercise.  Dyspneic on exercise but no significant ST changes to suggest ischemia.  Normal stress images.  No ischemia or infarction   TRANSTHORACIC ECHOCARDIOGRAM  09/06/2017   EF 15 to 20%.  Diffuse HK.  Severe LA dilation.  Mild RV dilation.  Mild MR.    reports that he quit smoking about 40 years ago. His smoking use included cigarettes. He started smoking about 50 years ago. He has a 5 pack-year smoking history. He has been exposed to tobacco smoke. He has never used smokeless tobacco. He reports current alcohol use of about 6.0 standard drinks of alcohol per week. He reports that he does not use drugs. family history includes Cancer in his sister; Cerebral aneurysm in his mother; Diabetes in his father  and paternal grandmother; Hypertension in his father and paternal grandmother; Stroke in his father and paternal grandmother. Allergies[1]    Outpatient Encounter Medications as of 08/23/2024  Medication Sig   atorvastatin  (LIPITOR) 20 MG tablet Take 1 tablet (20 mg total) by mouth daily.   diltiazem  (CARDIZEM  CD) 240 MG 24 hr capsule Take 1 capsule (240 mg total) by mouth daily.   ENTRESTO  49-51 MG  Take 1 tablet by mouth 2 (two) times daily.   Magnesium  400 MG TABS Take 2 tablets by mouth. Name is Magnesium  Taurate   omeprazole  (PRILOSEC) 40 MG capsule Take 1 capsule (40 mg total) by mouth daily.   spironolactone  (ALDACTONE ) 25 MG tablet Take 1 tablet (25 mg total) by mouth daily.   tadalafil  (CIALIS ) 20 MG tablet Take 1 tablet (20 mg total) by mouth daily as needed.   Vibegron  (GEMTESA ) 75 MG TABS Take 1 tablet (75 mg total) by mouth daily.   [DISCONTINUED] Blood Glucose Monitoring Suppl (ONE TOUCH ULTRA 2) w/Device KIT Use 4 (four) times daily -  before meals and at bedtime.   [DISCONTINUED] glucose blood (ONETOUCH ULTRA) test strip Check blood sugars 4 times a day before and after meals   [DISCONTINUED] OneTouch Delica Lancets 33G MISC Check blood sugars 4 times a day before and after meals   No facility-administered encounter medications on file as of 08/23/2024.    REVIEW OF SYSTEMS  : All other systems reviewed and negative except where noted in the History of Present Illness.   PHYSICAL EXAM: BP 112/64   Pulse 64   Ht 5' 7 (1.702 m)   Wt 158 lb 4 oz (71.8 kg)   SpO2 99%   BMI 24.79 kg/m  General: Well developed AA male in no acute distress Head: Normocephalic and atraumatic Eyes:  Sclerae anicteric, conjunctiva pink. Ears: Normal auditory acuity Lungs: Clear throughout to auscultation; no W/R/R. Heart: Regular rate and rhythm; no M/R/G. Abdomen: Soft, non-distended.  BS present.  Epigastric TTP. Musculoskeletal: Symmetrical with no gross deformities  Skin: No lesions on visible extremities Extremities: No edema  Neurological: Alert oriented x 4, grossly non-focal Psychological:  Alert and cooperative. Normal mood and affect Assessment & Plan Dyspepsia Three weeks of persistent epigastric pain, burning, early satiety, and nausea refractory to omeprazole  but has only been on this for about 12 days, raising concern for acid-related mucosal disease including  esophagitis, gastritis, or peptic ulcer disease. - Continued omeprazole  as previously prescribed, 40 mg daily. - Prescribed sucralfate  (Carafate ) liquid to be taken two to four times daily.   - Ordered upper endoscopy (EGD) to evaluate for esophageal and gastric pathology with Dr. Shila. - Discussed conditional plan for abdominal CT if EGD is delayed or symptoms persist despite medical therapy.   CC:  Jaycee Greig PARAS, NP       [1]  Allergies Allergen Reactions   Amlodipine  Other (See Comments)    Fatigue   Ace Inhibitors Cough    Tolerates ARB   Lisinopril  Cough   "

## 2024-08-25 ENCOUNTER — Encounter: Payer: Self-pay | Admitting: Gastroenterology

## 2024-08-25 ENCOUNTER — Ambulatory Visit: Admitting: Gastroenterology

## 2024-08-25 VITALS — BP 103/59 | HR 14 | Temp 98.1°F | Resp 14 | Ht 67.0 in | Wt 158.4 lb

## 2024-08-25 DIAGNOSIS — K2289 Other specified disease of esophagus: Secondary | ICD-10-CM

## 2024-08-25 DIAGNOSIS — K219 Gastro-esophageal reflux disease without esophagitis: Secondary | ICD-10-CM | POA: Diagnosis not present

## 2024-08-25 DIAGNOSIS — K208 Other esophagitis without bleeding: Secondary | ICD-10-CM | POA: Diagnosis not present

## 2024-08-25 DIAGNOSIS — R1310 Dysphagia, unspecified: Secondary | ICD-10-CM | POA: Diagnosis not present

## 2024-08-25 DIAGNOSIS — K222 Esophageal obstruction: Secondary | ICD-10-CM | POA: Diagnosis not present

## 2024-08-25 DIAGNOSIS — K2970 Gastritis, unspecified, without bleeding: Secondary | ICD-10-CM

## 2024-08-25 DIAGNOSIS — K2950 Unspecified chronic gastritis without bleeding: Secondary | ICD-10-CM

## 2024-08-25 MED ORDER — SODIUM CHLORIDE 0.9 % IV SOLN: 500.0000 mL | INTRAVENOUS | Status: DC

## 2024-08-25 NOTE — Progress Notes (Signed)
 Called to room to assist during endoscopic procedure.  Patient ID and intended procedure confirmed with present staff. Received instructions for my participation in the procedure from the performing physician.

## 2024-08-25 NOTE — Op Note (Signed)
 [facility] Patient Name: Todd Greer Procedure Date: 08/25/2024 11:38 AM MRN: [id number] Endoscopist: Gustav ALONSO Mcgee , MD, [id number] Age: 68 Referring MD:  Date of Birth: [birth year] Gender: Male Account #: [id number] Procedure:                Upper GI endoscopy Indications:              Dysphagia, Epigastric abdominal pain, Dyspepsia Medicines:                Monitored Anesthesia Care Procedure:                Pre-Anesthesia Assessment:                           - Prior to the procedure, a History and Physical                            was performed, and patient medications and                            allergies were reviewed. The patient's tolerance of                            previous anesthesia was also reviewed. The risks                            and benefits of the procedure and the sedation                            options and risks were discussed with the patient.                            All questions were answered, and informed consent                            was obtained. Prior Anticoagulants: The patient has                            taken no anticoagulant or antiplatelet agents. ASA                            Grade Assessment: II - A patient with mild systemic                            disease. After reviewing the risks and benefits,                            the patient was deemed in satisfactory condition to                            undergo the procedure.                           After obtaining informed consent, the endoscope was  passed under direct vision. Throughout the                            procedure, the patient's blood pressure, pulse, and                            oxygen  saturations were monitored continuously. The                            Olympus Scope [id number] was introduced through the                            mouth, and advanced to the second part of duodenum.                             The upper GI endoscopy was accomplished without                            difficulty. The patient tolerated the procedure                            well. Scope In: Scope Out: Findings:                 One benign-appearing, intrinsic mild stenosis was                            found 37 to 38 cm from the incisors. This stenosis                            measured less than one cm (in length). The stenosis                            was traversed. A TTS dilator was passed through the                            scope. Dilation with an 18-19-20 mm x 8 cm CRE                            balloon dilator was performed to 20 mm. The                            dilation site was examined following endoscope                            reinsertion and showed mild mucosal disruption.                           There were esophageal mucosal changes suspicious                            for short-segment Barrett's esophagus present in  the lower third of the esophagus. The maximum                            longitudinal extent of these mucosal changes was 2                            cm in length. This was biopsied with a cold forceps                            for histology.                           Patchy mild inflammation characterized by                            congestion (edema), erythema and friability was                            found in the entire examined stomach. Biopsies were                            taken with a cold forceps for Helicobacter pylori                            testing.                           The cardia and gastric fundus were normal on                            retroflexion.                           The examined duodenum was normal. Complications:            No immediate complications. Estimated Blood Loss:     Estimated blood loss was minimal. Impression:               - Benign-appearing esophageal stenosis. Dilated.                            - Esophageal mucosal changes suspicious for                            short-segment Barrett's esophagus. Biopsied.                           - Gastritis. Biopsied.                           - Normal examined duodenum. Recommendation:           - Resume previous diet.                           - Continue present medications.                           -  Await pathology results.                           - Use Prilosec (omeprazole ) 40 mg PO BID for 2                            months and then decrease to once daily.                           - Follow an antireflux regimen. Kingsly Kloepfer V. Galen Malkowski, MD 08/25/2024 12:16:16 PM This report has been signed electronically.

## 2024-08-25 NOTE — Progress Notes (Unsigned)
 Vssnnad trans nto pacu

## 2024-08-25 NOTE — Patient Instructions (Signed)
 Resume previous diet.  Continue present medications.  Await pathology results.   Follow antireflux regimen.   Use Prilosec (omeprazole ) 40 mg by mouth twice a day for 2 months, then decrease to once daily.   YOU HAD AN ENDOSCOPIC PROCEDURE TODAY AT THE [facility]:   Refer to the procedure report that was given to you for any specific questions about what was found during the examination.  If the procedure report does not answer your questions, please call your gastroenterologist to clarify.  If you requested that your care partner not be given the details of your procedure findings, then the procedure report has been included in a sealed envelope for you to review at your convenience later.  YOU SHOULD EXPECT: Some feelings of bloating in the abdomen. Passage of more gas than usual.  Walking can help get rid of the air that was put into your GI tract during the procedure and reduce the bloating. If you had a lower endoscopy (such as a colonoscopy or flexible sigmoidoscopy) you may notice spotting of blood in your stool or on the toilet paper. If you underwent a bowel prep for your procedure, you may not have a normal bowel movement for a few days.  Please Note:  You might notice some irritation and congestion in your nose or some drainage.  This is from the oxygen  used during your procedure.  There is no need for concern and it should clear up in a day or so.  SYMPTOMS TO REPORT IMMEDIATELY:  Following upper endoscopy (EGD)  Vomiting of blood or coffee ground material  New chest pain or pain under the shoulder blades  Painful or persistently difficult swallowing  New shortness of breath  Fever of 100F or higher  Black, tarry-looking stools  For urgent or emergent issues, a gastroenterologist can be reached at any hour by calling [phone]. Do not use MyChart messaging for urgent concerns.    DIET:  We do recommend a small meal at first, but then you may proceed to your  regular diet.  Drink plenty of fluids but you should avoid alcoholic beverages for 24 hours.  ACTIVITY:  You should plan to take it easy for the rest of today and you should NOT DRIVE or use heavy machinery until tomorrow (because of the sedation medicines used during the test).    FOLLOW UP: Our staff will call the number listed on your records the next business day following your procedure.  We will call around 7:15- 8:00 am to check on you and address any questions or concerns that you may have regarding the information given to you following your procedure. If we do not reach you, we will leave a message.     If any biopsies were taken you will be contacted by phone or by letter within the next 1-3 weeks.  Please call us  at [phone] if you have not heard about the biopsies in 3 weeks.    SIGNATURES/CONFIDENTIALITY: You and/or your care partner have signed paperwork which will be entered into your electronic medical record.  These signatures attest to the fact that that the information above on your After Visit Summary has been reviewed and is understood.  Full responsibility of the confidentiality of this discharge information lies with you and/or your care-partner.

## 2024-08-25 NOTE — Progress Notes (Unsigned)
 [facility] History and Physical   Primary Care Physician:  Jaycee Greig PARAS, NP   Reason for Procedure:  GERD, epigastric pain, dyspepsia, pill dysphagia  Plan:    EGD with possible interventions as needed     HPI: Todd Greer is a very pleasant 68 y.o. male here for EGD for GERD, epigastric pain, dyspepsia, pill dysphagia.  Please refer to office visit note by Jessica Zehr for details  The risks and benefits as well as alternatives of endoscopic procedure(s) have been discussed and reviewed.  The patient was provided an opportunity to ask questions and all were answered. The patient agreed with the plan and demonstrated an understanding of the instructions.   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 BID, empagliflozin  10 mg daily, spironolactone  25 mg daily.   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  Mild RV dilation.  Mild MR.  Catheterization showed EF 25% normal EDP and normal coronaries (has been followed by Dr. Levern)   Obesity, Class II, BMI 35-39.9, with comorbidity    Obesity, diabetes, and hypertension syndrome (HCC)    Substance abuse (HCC)     Past Surgical History:  Procedure Laterality Date   LEFT HEART CATH AND CORONARY ANGIOGRAPHY N/A 09/09/2017   Procedure: LEFT HEART CATH AND CORONARY ANGIOGRAPHY;  Surgeon: Levern Hutching, MD;  Location: [facility];  Service: Cardiovascular; EF 15 to 20%.  Diffuse HK.  Normal LVEDP.  Normal coronaries.   NM  EXERCISE MYOVIEW LTD  06/02/2013   Good exercise capacity.  Hypertensive response to exercise.  Dyspneic on  exercise but no significant ST changes to suggest ischemia.  Normal stress images.  No ischemia or infarction   TRANSTHORACIC ECHOCARDIOGRAM  09/06/2017   EF 15 to 20%.  Diffuse HK.  Severe LA dilation.  Mild RV dilation.  Mild MR.    Prior to Admission medications  Medication Sig Start Date End Date Taking? Authorizing Provider  diltiazem  (CARDIZEM  CD) 240 MG 24 hr capsule Take 1 capsule (240 mg total) by mouth daily. 01/06/24  Yes Mealor, Augustus E, MD  ENTRESTO  49-51 MG Take 1 tablet by mouth 2 (two) times daily. 01/14/24  Yes Anner Alm ORN, MD  omeprazole  (PRILOSEC) 40 MG capsule Take 1 capsule (40 mg total) by mouth daily. 08/23/24  Yes Zehr, Jessica D, PA-C  sucralfate  (CARAFATE ) 1 GM/10ML suspension Take 10 mLs (1 g total) by mouth 4 (four) times daily. 08/23/24  Yes Zehr, Jessica D, PA-C  atorvastatin  (LIPITOR) 20 MG tablet Take 1 tablet (20 mg total) by mouth daily. 01/21/24   Jaycee Greig PARAS, NP  Magnesium  400 MG TABS Take 2 tablets by mouth. Name is Magnesium  Taurate    [provider]  spironolactone  (ALDACTONE ) 25 MG tablet Take 1 tablet (25 mg total) by mouth daily. 01/23/24   Tanda Bleacher, MD  tadalafil  (CIALIS ) 20 MG tablet Take 1 tablet (20 mg total) by mouth daily as needed. 04/21/24   Stoneking, Adine PARAS., MD  Vibegron  (GEMTESA ) 75 MG TABS Take 1 tablet (75 mg total) by mouth daily. 04/21/24  Stoneking, Adine PARAS., MD    Current Outpatient Medications  Medication Sig Dispense Refill   diltiazem  (CARDIZEM  CD) 240 MG 24 hr capsule Take 1 capsule (240 mg total) by mouth daily. 90 capsule 2   ENTRESTO  49-51 MG Take 1 tablet by mouth 2 (two) times daily. 180 tablet 2   omeprazole  (PRILOSEC) 40 MG capsule Take 1 capsule (40 mg total) by mouth daily. 30 capsule 3   sucralfate  (CARAFATE ) 1 GM/10ML suspension Take 10 mLs (1 g total) by mouth 4 (four) times daily. 420 mL 1   atorvastatin  (LIPITOR) 20 MG tablet Take 1 tablet (20 mg total) by mouth daily. 90 tablet 0    Magnesium  400 MG TABS Take 2 tablets by mouth. Name is Magnesium  Taurate     spironolactone  (ALDACTONE ) 25 MG tablet Take 1 tablet (25 mg total) by mouth daily. 90 tablet 0   tadalafil  (CIALIS ) 20 MG tablet Take 1 tablet (20 mg total) by mouth daily as needed. 10 tablet 11   Vibegron  (GEMTESA ) 75 MG TABS Take 1 tablet (75 mg total) by mouth daily.     Current Facility-Administered Medications  Medication Dose Route Frequency Provider Last Rate Last Admin   0.9 %  sodium chloride  infusion  500 mL Intravenous Continuous Daviana Haymaker V, MD        Allergies as of 08/25/2024 - Review Complete 08/25/2024  Allergen Reaction Noted   Amlodipine  Other (See Comments) 06/03/2014   Ace inhibitors Cough 06/03/2014   Lisinopril  Cough 04/02/2023    Family History  Problem Relation Age of Onset   Cerebral aneurysm Mother        Died at a young age.   Stroke Father    Diabetes Father    Hypertension Father    Cancer Sister    Diabetes Paternal Grandmother    Hypertension Paternal Grandmother    Stroke Paternal Grandmother    Colon cancer Neg Hx    Rectal cancer Neg Hx    Stomach cancer Neg Hx    Esophageal cancer Neg Hx    Colon polyps Neg Hx     Social History   Socioeconomic History   Marital status: Married    Spouse name: Not on file   Number of children: Not on file   Years of education: Not on file   Highest education level: Not on file  Occupational History   Not on file  Tobacco Use   Smoking status: Former    Current packs/day: 0.00    Average packs/day: 0.5 packs/day for 10.0 years (5.0 ttl pk-yrs)    Types: Cigarettes    Start date: 10/03/1973    Quit date: 10/04/1983    Years since quitting: 40.9    Passive exposure: Past   Smokeless tobacco: Never  Vaping Use   Vaping status: Never Used  Substance and Sexual Activity   Alcohol use: Not Currently    Alcohol/week: 6.0 standard drinks of alcohol    Types: 6 Standard drinks or equivalent per week   Drug use: No    Sexual activity: Yes  Other Topics Concern   Not on file  Social History Narrative   His married father of 4.  His married for 28 years.  He lives with his wife and daughters.  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  Education: [employer].   He quit smoking in 1985.  He drinks 3 beers a week.   Prior to the onset of  his current symptoms, used to work out with weights for at least 40 minutes a day for 3 times a week.  He usually did light weights for many occasions as opposed to heavy weights.      Contacts: Wife Othel; daughter Zelda Sharps   Social Drivers of Health   Tobacco Use: Medium Risk (08/25/2024)   Patient History    Smoking Tobacco Use: Former    Smokeless Tobacco Use: Never    Passive Exposure: Past  Physicist, Medical Strain: Low Risk (08/04/2023)   Overall Financial Resource Strain (CARDIA)    Difficulty of Paying Living Expenses: Not hard at all  Food Insecurity: No Food Insecurity (08/04/2023)   Hunger Vital Sign    Worried About Running Out of Food in the Last Year: Never true    Ran Out of Food in the Last Year: Never true  Transportation Needs: No Transportation Needs (08/04/2023)   PRAPARE - Administrator, Civil Service (Medical): No    Lack of Transportation (Non-Medical): No  Physical Activity: Not on file  Stress: No Stress Concern Present (08/04/2023)   [employer] of Occupational Health - Occupational Stress Questionnaire    Feeling of Stress : Not at all  Social Connections: Socially Integrated (02/03/2024)   Social Connection and Isolation Panel    Frequency of Communication with Friends and Family: More than three times a week    Frequency of Social Gatherings with Friends and Family: More than three times a week    Attends Religious Services: More than 4 times per year    Active Member of Clubs or Organizations: Yes    Attends Banker Meetings: More than 4 times per year     Marital Status: Married  Catering Manager Violence: Not At Risk (08/04/2023)   Humiliation, Afraid, Rape, and Kick questionnaire    Fear of Current or Ex-Partner: No    Emotionally Abused: No    Physically Abused: No    Sexually Abused: No  Depression (PHQ2-9): Low Risk (03/09/2024)   Depression (PHQ2-9)    PHQ-2 Score: 0  Alcohol Screen: Low Risk (08/04/2023)   Alcohol Screen    Last Alcohol Screening Score (AUDIT): 0  Housing: Low Risk (08/04/2023)   Housing    Last Housing Risk Score: 0  Utilities: Not At Risk (08/04/2023)   AHC Utilities    Threatened with loss of utilities: No  Health Literacy: Adequate Health Literacy (08/04/2023)   B1300 Health Literacy    Frequency of need for help with medical instructions: Never    Review of Systems:  All other review of systems negative except as mentioned in the HPI.  Physical Exam: Vital signs in last 24 hours: BP (!) 127/58   Pulse (!) 58   Temp 98.1 F (36.7 C) (Temporal)   Ht 5' 7 (1.702 m)   Wt 158 lb 6.4 oz (71.8 kg)   SpO2 99%   BMI 24.81 kg/m  General:   Alert, NAD Lungs:  Clear .   Heart:  Regular rate and rhythm Abdomen:  Soft, nontender and nondistended. Neuro/Psych:  Alert and cooperative. Normal mood and affect. A and O x 3  Reviewed labs, radiology imaging, old records and pertinent past GI work up  Patient is appropriate for planned procedure(s) and anesthesia in an ambulatory setting   K. Veena Lakina Mcintire , MD [phone]

## 2024-08-25 NOTE — Progress Notes (Unsigned)
 Pt's states no medical or surgical changes since previsit or office visit.

## 2024-08-30 ENCOUNTER — Telehealth: Payer: Self-pay | Admitting: *Deleted

## 2024-08-30 NOTE — Telephone Encounter (Signed)
" °  Follow up Call-     08/25/2024   11:12 AM 12/30/2023   10:15 AM  Call back number  Post procedure Call Back phone  # [phone]  Permission to leave phone message Yes Yes     Patient questions:  Do you have a fever, pain , or abdominal swelling? Yes- see below Pain Score  see below *  Have you tolerated food without any problems? Yes.    Have you been able to return to your normal activities? Yes.    Do you have any questions about your discharge instructions: Diet   No. Medications  No. Follow up visit  No.  Do you have questions or concerns about your Care? No.  Actions: * If pain score is 4 or above: Spoke with pt- he states that I am having the same abdominal pain.  Able to eat some and states, it's not different than before I came in.  Advised to take Prilosec as directed and can take Tylenol  for pain as needed.  Biopsies were taken during his EGD and told him we would be in touch with him as soon as the results are seen by Dr. Doreen   "

## 2024-08-31 ENCOUNTER — Encounter: Payer: Self-pay | Admitting: Gastroenterology

## 2024-09-01 ENCOUNTER — Other Ambulatory Visit: Payer: Self-pay

## 2024-09-01 DIAGNOSIS — R1013 Epigastric pain: Secondary | ICD-10-CM

## 2024-09-01 LAB — SURGICAL PATHOLOGY

## 2024-09-01 MED ORDER — HYOSCYAMINE SULFATE 0.125 MG SL SUBL
0.1250 mg | SUBLINGUAL_TABLET | Freq: Four times a day (QID) | SUBLINGUAL | 1 refills | Status: DC | PRN
  Filled 2024-09-01: qty 30, 8d supply, fill #0

## 2024-09-07 ENCOUNTER — Ambulatory Visit (HOSPITAL_BASED_OUTPATIENT_CLINIC_OR_DEPARTMENT_OTHER)
Admission: RE | Admit: 2024-09-07 | Discharge: 2024-09-07 | Disposition: A | Discharge status: Home / Self Care | Source: Ambulatory Visit | Attending: Gastroenterology | Admitting: Gastroenterology

## 2024-09-07 ENCOUNTER — Encounter (HOSPITAL_BASED_OUTPATIENT_CLINIC_OR_DEPARTMENT_OTHER): Payer: Self-pay

## 2024-09-07 DIAGNOSIS — R1013 Epigastric pain: Secondary | ICD-10-CM | POA: Insufficient documentation

## 2024-09-07 MED ORDER — IOHEXOL 300 MG/ML  SOLN
100.0000 mL | Freq: Once | INTRAMUSCULAR | Status: AC | PRN
  Administered 2024-09-07: 100 mL via INTRAVENOUS

## 2024-09-13 ENCOUNTER — Ambulatory Visit: Payer: Self-pay | Admitting: Gastroenterology

## 2024-09-13 ENCOUNTER — Other Ambulatory Visit: Payer: Self-pay

## 2024-09-13 DIAGNOSIS — R160 Hepatomegaly, not elsewhere classified: Secondary | ICD-10-CM

## 2024-09-14 ENCOUNTER — Encounter

## 2024-09-14 ENCOUNTER — Other Ambulatory Visit: Payer: Self-pay

## 2024-09-14 ENCOUNTER — Other Ambulatory Visit: Payer: Self-pay | Admitting: Gastroenterology

## 2024-09-14 MED ORDER — TRAMADOL HCL 50 MG PO TABS
50.0000 mg | ORAL_TABLET | Freq: Three times a day (TID) | ORAL | 0 refills | Status: AC | PRN
  Filled 2024-09-14: qty 20, 7d supply, fill #0

## 2024-09-14 NOTE — Telephone Encounter (Signed)
 I have spoken to the pt and discussed the results.  He was having trouble with his phone yesterday.  He would like something for pain sent.  He will await calls to set up the oncology appt and IR biopsy.  He will let us  know if he has not heard from them by the end of the week.

## 2024-09-16 ENCOUNTER — Encounter (HOSPITAL_COMMUNITY): Payer: Self-pay

## 2024-09-16 ENCOUNTER — Ambulatory Visit: Payer: Self-pay | Admitting: Gastroenterology

## 2024-09-16 NOTE — Progress Notes (Signed)
 Todd Simmonds, MD  [name] PROCEDURE / BIOPSY REVIEW Date: 09/14/2024  Requested Biopsy site: Liver Reason for request: mass, q [facility] Imaging review: Best seen on CT  Decision: Approved Imaging modality to perform: Ultrasound Schedule with: Moderate Sedation Schedule for: Any VIR  Additional comments: @Schedulers . US  Liver Mass Bx. Mod sed  Please contact me with questions, concerns, or if issue pertaining to this request arise.  [name], MD [facility]       Previous Messages    ----- Message ----- From: [name] Sent: 09/13/2024   4:20 PM EST To: [name]; Ir Procedure Requests Subject: US  liver biopsy                                Procedure : US  liver biopsy  Reason: liver mass Dx: Liver mass [R16.0 (ICD-10-CM)]    History : Ct abd pelv w/  Provider : Nandigam, Kavitha V, MD  Contact : [phone]

## 2024-09-23 ENCOUNTER — Other Ambulatory Visit: Payer: Self-pay | Admitting: Radiology

## 2024-09-23 DIAGNOSIS — R160 Hepatomegaly, not elsewhere classified: Secondary | ICD-10-CM

## 2024-09-23 NOTE — H&P (Signed)
 "     Chief Complaint: Patient was seen in consultation today for liver lesion  Referring Physician(s): Shila Gustav GAILS  Supervising Physician: Luverne Aran  Patient Status: [facility] - Out-pt  History of Present Illness: Todd Greer is a 69 y.o. male with history of GERD, epigastric pain, dyspepsia who underwent EGD for same with GI 08/25/24.  He was found to have mucosal abnormalities in the stomach as well as esophagus which were biopsied.  With complaint of ongoing abdominal pain he was also sent for CT Abdomen Pelvis which showed: Cirrhosis and mild perihepatic ascites.   14 cm heterogeneous mass involving the anterior right hepatic lobe and liver dome, with thrombus in the adjacent right portal vein, highly suspicious for tumor thrombus. A few other subtle foci of hyperenhancement in the right and left hepatic lobes, suspicious for other small hypervascular masses. These findings raise suspicion for multifocal hepatocellular carcinoma.   Bulky abdominal lymphadenopathy, consistent with metastatic disease.  He was referred to [facility] for liver mass biopsy at the request of Dr. Shila.   Patient presents for prcoedure today in his usual state of health. No new complaints or concerns.   HE is FULL CODE.   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 BID, empagliflozin  10 mg daily, spironolactone  25 mg daily.   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  Mild RV dilation.  Mild MR.  Catheterization showed EF 25% normal EDP and normal coronaries (has been followed by Dr. Levern)    Obesity, Class II, BMI 35-39.9, with comorbidity    Obesity, diabetes, and hypertension syndrome (HCC)    Substance abuse (HCC)     Past Surgical History:  Procedure Laterality Date   LEFT HEART CATH AND CORONARY ANGIOGRAPHY N/A 09/09/2017   Procedure: LEFT HEART CATH AND CORONARY ANGIOGRAPHY;  Surgeon: Levern Hutching, MD;  Location: [facility] INVASIVE CV LAB;  Service: Cardiovascular; EF 15 to 20%.  Diffuse HK.  Normal LVEDP.  Normal coronaries.   NM  EXERCISE MYOVIEW LTD  06/02/2013   Good exercise capacity.  Hypertensive response to exercise.  Dyspneic on exercise but no significant ST changes to suggest ischemia.  Normal stress images.  No ischemia or infarction   TRANSTHORACIC ECHOCARDIOGRAM  09/06/2017   EF 15 to 20%.  Diffuse HK.  Severe LA dilation.  Mild RV dilation.  Mild MR.    Allergies: Amlodipine , Ace inhibitors, and Lisinopril   Medications: Prior to Admission medications  Medication Sig Start Date End Date Taking? Authorizing Provider  atorvastatin  (LIPITOR) 20 MG tablet Take 1 tablet (20 mg total) by mouth daily. 01/21/24   Jaycee Greig PARAS, NP  Cholecalciferol  (VITAMIN D ) 50 MCG (2000 UT) CAPS Take by mouth.    [provider]  Cyanocobalamin  (VITAMIN B-12 CR) 1500 MCG TBCR Take by mouth.    [provider]  diltiazem  (CARDIZEM  CD) 240 MG 24 hr capsule Take 1 capsule (240 mg total) by mouth daily. 01/06/24   Mealor, Augustus E, MD  ENTRESTO  49-51 MG Take 1 tablet by mouth 2 (two) times daily. 01/14/24   Anner Alm ORN,  MD  hyoscyamine  (LEVSIN  SL) 0.125 MG SL tablet Place 1 tablet (0.125 mg total) under the tongue every 6 (six) hours as needed. 09/01/24   Zehr, Jessica D, PA-C  Magnesium  400 MG TABS Take 2 tablets by mouth. Name is Magnesium  Taurate    [provider]  Multiple Vitamin (MULTIVITAMIN) tablet Take 1 tablet by mouth daily.    [provider]  omeprazole  (PRILOSEC) 40 MG capsule Take 1 capsule (40 mg total) by mouth daily. 08/23/24    Zehr, Jessica D, PA-C  spironolactone  (ALDACTONE ) 25 MG tablet Take 1 tablet (25 mg total) by mouth daily. 01/23/24   Tanda Bleacher, MD  sucralfate  (CARAFATE ) 1 GM/10ML suspension Take 10 mLs (1 g total) by mouth 4 (four) times daily. 08/23/24   Zehr, Jessica D, PA-C  tadalafil  (CIALIS ) 20 MG tablet Take 1 tablet (20 mg total) by mouth daily as needed. 04/21/24   Stoneking, Adine PARAS., MD  traMADol  (ULTRAM ) 50 MG tablet Take 1 tablet (50 mg total) by mouth every 8 (eight) hours as needed. 09/14/24   Zehr, Jessica D, PA-C  Vibegron  (GEMTESA ) 75 MG TABS Take 1 tablet (75 mg total) by mouth daily. 04/21/24   Stoneking, Adine PARAS., MD     Family History  Problem Relation Age of Onset   Cerebral aneurysm Mother        Died at a young age.   Stroke Father    Diabetes Father    Hypertension Father    Cancer Sister    Diabetes Paternal Grandmother    Hypertension Paternal Grandmother    Stroke Paternal Grandmother    Colon cancer Neg Hx    Rectal cancer Neg Hx    Stomach cancer Neg Hx    Esophageal cancer Neg Hx    Colon polyps Neg Hx     Social History   Socioeconomic History   Marital status: Married    Spouse name: Not on file   Number of children: Not on file   Years of education: Not on file   Highest education level: Not on file  Occupational History   Not on file  Tobacco Use   Smoking status: Former    Current packs/day: 0.00    Average packs/day: 0.5 packs/day for 10.0 years (5.0 ttl pk-yrs)    Types: Cigarettes    Start date: 10/03/1973    Quit date: 10/04/1983    Years since quitting: 41.0    Passive exposure: Past   Smokeless tobacco: Never  Vaping Use   Vaping status: Never Used  Substance and Sexual Activity   Alcohol use: Not Currently    Alcohol/week: 6.0 standard drinks of alcohol    Types: 6 Standard drinks or equivalent per week   Drug use: No   Sexual activity: Yes  Other Topics Concern   Not on file  Social History Narrative   His married father of 4.   His married for 28 years.  He lives with his wife and daughters.  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  Education: [employer].   He quit smoking in 1985.  He drinks 3 beers a week.   Prior to the onset of his current symptoms, used to work out with weights for at least 40 minutes a day for 3 times a week.  He usually did light weights for many occasions as opposed to heavy weights.      Contacts: Wife Othel; daughter Zelda Sharps  Social Drivers of Health   Tobacco Use: Medium Risk (09/24/2024)   Patient History    Smoking Tobacco Use: Former    Smokeless Tobacco Use: Never    Passive Exposure: Past  Physicist, Medical Strain: Low Risk (08/04/2023)   Overall Financial Resource Strain (CARDIA)    Difficulty of Paying Living Expenses: Not hard at all  Food Insecurity: No Food Insecurity (08/04/2023)   Hunger Vital Sign    Worried About Running Out of Food in the Last Year: Never true    Ran Out of Food in the Last Year: Never true  Transportation Needs: No Transportation Needs (08/04/2023)   PRAPARE - Administrator, Civil Service (Medical): No    Lack of Transportation (Non-Medical): No  Physical Activity: Not on file  Stress: No Stress Concern Present (08/04/2023)   [employer] of Occupational Health - Occupational Stress Questionnaire    Feeling of Stress : Not at all  Social Connections: Socially Integrated (02/03/2024)   Social Connection and Isolation Panel    Frequency of Communication with Friends and Family: More than three times a week    Frequency of Social Gatherings with Friends and Family: More than three times a week    Attends Religious Services: More than 4 times per year    Active Member of Clubs or Organizations: Yes    Attends Banker Meetings: More than 4 times per year    Marital Status: Married  Depression (PHQ2-9): Low Risk (03/09/2024)   Depression (PHQ2-9)    PHQ-2 Score: 0   Alcohol Screen: Low Risk (08/04/2023)   Alcohol Screen    Last Alcohol Screening Score (AUDIT): 0  Housing: Low Risk (08/04/2023)   Housing    Last Housing Risk Score: 0  Utilities: Not At Risk (08/04/2023)   AHC Utilities    Threatened with loss of utilities: No  Health Literacy: Adequate Health Literacy (08/04/2023)   B1300 Health Literacy    Frequency of need for help with medical instructions: Never     Review of Systems: A 12 point ROS discussed and pertinent positives are indicated in the HPI above.  All other systems are negative.  Review of Systems  Constitutional:  Negative for fatigue and fever.  Respiratory:  Negative for cough and shortness of breath.   Cardiovascular:  Negative for chest pain.  Gastrointestinal:  Negative for abdominal pain.  Musculoskeletal:  Negative for back pain.  Psychiatric/Behavioral:  Negative for behavioral problems and confusion.     Vital Signs: There were no vitals taken for this visit.  Physical Exam Vitals and nursing note reviewed.  Constitutional:      General: He is not in acute distress.    Appearance: Normal appearance. He is not ill-appearing.  Cardiovascular:     Rate and Rhythm: Normal rate. Rhythm irregular.     Comments: Occasional dropped beat Abdominal:     General: There is no distension.     Tenderness: There is no abdominal tenderness.  Skin:    General: Skin is warm and dry.  Neurological:     General: No focal deficit present.     Mental Status: He is alert and oriented to person, place, and time. Mental status is at baseline.  Psychiatric:        Mood and Affect: Mood normal.        Behavior: Behavior normal.        Thought Content: Thought content normal.        Judgment:  Judgment normal.      MD Evaluation Airway: WNL Heart: WNL Abdomen: WNL Chest/ Lungs: WNL ASA  Classification: 3 Mallampati/Airway Score: Two   Imaging: CT ABDOMEN PELVIS W CONTRAST Result Date: 09/12/2024 CLINICAL DATA:   Epigastric abdominal pain.  * Tracking Code: BO * EXAM: CT ABDOMEN AND PELVIS WITH CONTRAST TECHNIQUE: Multidetector CT imaging of the abdomen and pelvis was performed using the standard protocol following bolus administration of intravenous contrast. RADIATION DOSE REDUCTION: This exam was performed according to the departmental dose-optimization program which includes automated exposure control, adjustment of the mA and/or kV according to patient size and/or use of iterative reconstruction technique. CONTRAST:  OMNIPAQUE  IOHEXOL  300 MG/ML  SOLN COMPARISON:  07/06/2021 FINDINGS: Lower Chest: Tiny right pleural effusion noted. Hepatobiliary: Hepatic capsular nodularity noted, consistent with cirrhosis. Mild perihepatic ascites noted. A large heterogeneous, predominantly low-attenuation mass is seen involving the anterior right hepatic lobe and liver dome, which measures 14.0 x 13.0 cm. Thrombus is seen in the adjacent right portal vein, highly suspicious for tumor thrombus. A few other tiny subtle foci of hyperenhancement are seen in the right and left hepatic lobes which may represent other small hypervascular masses. These findings raise suspicion for multifocal hepatocellular carcinoma. Gallbladder is unremarkable. No evidence of biliary ductal dilatation. Pancreas:  No mass or inflammatory changes. Spleen: Within normal limits in size and appearance. Adrenals/Urinary Tract: No suspicious masses identified. No evidence of ureteral calculi or hydronephrosis. Stomach/Bowel: No evidence of obstruction, inflammatory process or abnormal fluid collections. Normal appendix visualized. Vascular/Lymphatic: Bulky lymphadenopathy is seen in the porta hepatis, portacaval, and aortocaval spaces, with largest conglomerate mass measuring 8.4 x 6.5 cm. Left abdominal retroperitoneal lymphadenopathy is also seen adjacent to the superior mesenteric artery measuring 4.0 x 3.7 cm on image 33/301. Mild lymphadenopathy between  the stomach and pancreatic tail measures 3.1 x 1.8 cm on image 26/301. Retroperitoneal lymphadenopathy seen just inferior to the pancreatic uncinate process measures 4.1 x 2.3 cm on image 46/301. No pelvic lymphadenopathy identified. Reproductive: Moderate to markedly enlarged prostate gland seen, with mass effect on bladder base. Other:  None. Musculoskeletal:  No suspicious bone lesions identified. IMPRESSION: Cirrhosis and mild perihepatic ascites. 14 cm heterogeneous mass involving the anterior right hepatic lobe and liver dome, with thrombus in the adjacent right portal vein, highly suspicious for tumor thrombus. A few other subtle foci of hyperenhancement in the right and left hepatic lobes, suspicious for other small hypervascular masses. These findings raise suspicion for multifocal hepatocellular carcinoma. Bulky abdominal lymphadenopathy, consistent with metastatic disease. Moderate to markedly enlarged prostate gland. These results will be called to the ordering clinician or representative by the Radiologist Assistant, and communication documented in the PACS or [employer]. Electronically Signed   By: Norleen DELENA Kil M.D.   On: 09/12/2024 15:04    Labs:  CBC: Recent Labs    02/03/24 1041 08/11/24 0147 08/11/24 0200  WBC 3.2* 4.7  --   HGB 14.2 14.3 15.0  HCT 42.1 42.7 44.0  PLT 167 236  --     COAGS: No results for input(s): INR, APTT in the last 8760 hours.  BMP: Recent Labs    02/03/24 1041 08/11/24 0147 08/11/24 0200  NA 140 135 136  K 4.2 4.2 4.2  CL 102 101 102  CO2 21 23  --   GLUCOSE 83 153* 150*  BUN 10 13 14   CALCIUM  9.3 9.6  --   CREATININE 0.79 0.97 0.90  GFRNONAA  --  >60  --  LIVER FUNCTION TESTS: Recent Labs    02/03/24 1041  BILITOT 0.5  AST 40  ALT 34  ALKPHOS 85  PROT 7.0  ALBUMIN 4.1    TUMOR MARKERS: No results for input(s): AFPTM, CEA, CA199, CHROMGRNA in the last 8760 hours.  Assessment and Plan: Patient with past  medical history of GERD, dyspepsia presents with complaint of new liver mass and abdominal lymphadenopathy.  IR consulted for biopsy at the request of Dr. Shila. Case reviewed by Dr. Luverne who approves patient for procedure.  Patient presents today in their usual state of health.  He has been NPO and is not currently on blood thinners.   Risks and benefits of biopsy was discussed with the patient and/or patient's family including, but not limited to bleeding, infection, damage to adjacent structures or low yield requiring additional tests.  All of the questions were answered and there is agreement to proceed.  Consent signed and in chart.   Thank you for this interesting consult.  I greatly enjoyed meeting Todd Greer and look forward to participating in their care.  A copy of this report was sent to the requesting provider on this date.  Electronically Signed: Mishael Krysiak Sue-Ellen Beulah Matusek, PA 09/24/2024, 9:06 AM   I spent a total of  30 Minutes   in face to face in clinical consultation, greater than 50% of which was counseling/coordinating care for liver lesion  "

## 2024-09-24 ENCOUNTER — Other Ambulatory Visit: Payer: Self-pay

## 2024-09-24 ENCOUNTER — Inpatient Hospital Stay

## 2024-09-24 ENCOUNTER — Inpatient Hospital Stay: Admitting: Oncology

## 2024-09-24 ENCOUNTER — Encounter (HOSPITAL_COMMUNITY): Payer: Self-pay

## 2024-09-24 ENCOUNTER — Ambulatory Visit (HOSPITAL_COMMUNITY)
Admission: RE | Admit: 2024-09-24 | Discharge: 2024-09-24 | Disposition: A | Discharge status: Home / Self Care | Source: Ambulatory Visit | Attending: Gastroenterology | Admitting: Gastroenterology

## 2024-09-24 ENCOUNTER — Ambulatory Visit (HOSPITAL_COMMUNITY)
Admission: RE | Admit: 2024-09-24 | Discharge: 2024-09-24 | Disposition: A | Source: Ambulatory Visit | Attending: Gastroenterology

## 2024-09-24 DIAGNOSIS — I81 Portal vein thrombosis: Secondary | ICD-10-CM | POA: Diagnosis not present

## 2024-09-24 DIAGNOSIS — R188 Other ascites: Secondary | ICD-10-CM | POA: Insufficient documentation

## 2024-09-24 DIAGNOSIS — R160 Hepatomegaly, not elsewhere classified: Secondary | ICD-10-CM | POA: Diagnosis present

## 2024-09-24 DIAGNOSIS — R590 Localized enlarged lymph nodes: Secondary | ICD-10-CM | POA: Insufficient documentation

## 2024-09-24 DIAGNOSIS — Z87891 Personal history of nicotine dependence: Secondary | ICD-10-CM | POA: Diagnosis not present

## 2024-09-24 LAB — GLUCOSE, CAPILLARY: Glucose-Capillary: 102 mg/dL — ABNORMAL HIGH (ref 70–99)

## 2024-09-24 LAB — PROTIME-INR
INR: 1.1 (ref 0.8–1.2)
Prothrombin Time: 14.7 s (ref 11.4–15.2)

## 2024-09-24 LAB — CBC
HCT: 40.4 % (ref 39.0–52.0)
Hemoglobin: 13.3 g/dL (ref 13.0–17.0)
MCH: 28.5 pg (ref 26.0–34.0)
MCHC: 32.9 g/dL (ref 30.0–36.0)
MCV: 86.5 fL (ref 80.0–100.0)
Platelets: 198 K/uL (ref 150–400)
RBC: 4.67 MIL/uL (ref 4.22–5.81)
RDW: 14.6 % (ref 11.5–15.5)
WBC: 5.3 K/uL (ref 4.0–10.5)
nRBC: 0 % (ref 0.0–0.2)

## 2024-09-24 MED ORDER — MIDAZOLAM HCL 2 MG/2ML IJ SOLN
INTRAMUSCULAR | Status: AC
  Filled 2024-09-24: qty 2

## 2024-09-24 MED ORDER — MIDAZOLAM HCL (PF) 2 MG/2ML IJ SOLN
INTRAMUSCULAR | Status: AC | PRN
  Administered 2024-09-24: .5 mg via INTRAVENOUS
  Administered 2024-09-24: 1 mg via INTRAVENOUS

## 2024-09-24 MED ORDER — LIDOCAINE HCL 1 % IJ SOLN
INTRAMUSCULAR | Status: AC
  Filled 2024-09-24: qty 20

## 2024-09-24 MED ORDER — FENTANYL CITRATE (PF) 100 MCG/2ML IJ SOLN
INTRAMUSCULAR | Status: AC | PRN
  Administered 2024-09-24: 25 ug via INTRAVENOUS
  Administered 2024-09-24: 50 ug via INTRAVENOUS

## 2024-09-24 MED ORDER — LIDOCAINE HCL 1 % IJ SOLN
20.0000 mL | Freq: Once | INTRAMUSCULAR | Status: AC
  Administered 2024-09-24: 10 mL via INTRADERMAL

## 2024-09-24 MED ORDER — SODIUM CHLORIDE 0.9 % IV SOLN: INTRAVENOUS | Status: DC

## 2024-09-24 MED ORDER — FENTANYL CITRATE (PF) 100 MCG/2ML IJ SOLN
INTRAMUSCULAR | Status: AC
  Filled 2024-09-24: qty 2

## 2024-09-24 NOTE — Discharge Instructions (Signed)
 Please call Interventional Radiology clinic [phone] with any questions or concerns.   You may remove your dressing and shower tomorrow.   Liver Biopsy, Care After After a liver biopsy, it is common to have these things in the area where the biopsy was done. You may: Have pain. Feel sore. Have bruising. You may also feel tired for a few days. Follow these instructions at home: Medicines Take over-the-counter and prescription medicines only as told by your doctor. If you were prescribed an antibiotic medicine, take it as told by your doctor. Do not stop taking the antibiotic, even if you start to feel better. Do not take medicines that may thin your blood. These medicines include aspirin and ibuprofen. Take them only if your doctor tells you to. If told, take steps to prevent problems with pooping (constipation). You may need to: Drink enough fluid to keep your pee (urine) pale yellow. Take medicines. You will be told what medicines to take. Eat foods that are high in fiber. These include beans, whole grains, and fresh fruits and vegetables. Limit foods that are high in fat and sugar. These include fried or sweet foods. Ask your doctor if you should avoid driving or using machines while you are taking your medicine. Caring for your incision Follow instructions from your doctor about how to take care of your cut from surgery (incisions). Make sure you: Wash your hands with soap and water for at least 20 seconds before and after you change your bandage. If you cannot use soap and water, use hand sanitizer. Change your bandage. Leavestitches or skin glue in place for at least two weeks. Leave tape strips alone unless you are told to take them off. You may trim the edges of the tape strips if they curl up. Check your incision every day for signs of infection. Check for: Redness, swelling, or more pain. Fluid or blood. Warmth. Pus or a bad smell. Do not take baths, swim, or use a  hot tub. Ask your doctor about taking showers or sponge baths. Activity Rest at home for 1-2 days, or as told by your doctor. Get up to take short walks every 1 to 2 hours. Ask for help if you feel weak or unsteady. Do not lift anything that is heavier than 10 lb (4.5 kg), or the limit that you are told. Do not play contact sports for 2 weeks after the procedure. Return to your normal activities as told by your doctor. Ask what activities are safe for you. General instructions  Do not drink alcohol in the first week after the procedure. Plan to have a responsible adult care for you for the time you are told after you leave the hospital or clinic. This is important. It is up to you to get the results of your procedure. Ask how to get your results when they are ready. Keep all follow-up visits.   Contact a doctor if: You have more bleeding in your incision. Your incision swells, or is red and more painful. You have fluid that comes from your incision. You develop a rash. You have fever or chills. Get help right away if: You have swelling, bloating, or pain in your belly (abdomen). You get dizzy or faint. You vomit or you feel like vomiting. You have trouble breathing or feel short of breath. You have chest pain. You have problems talking or seeing. You have trouble with your balance or moving your arms or legs. These symptoms may be an emergency. Get help  right away. Call your local emergency services (911 in the [place] Do not wait to see if the symptoms will go away. Do not drive yourself to the hospital. Summary After the procedure, it is common to have pain, soreness, bruising, and tiredness. Your doctor will tell you how to take care of yourself at home. Change your bandage, take your medicines, and limit your activities as told by your doctor. Call your doctor if you have symptoms of infection. Get help right away if your belly swells, your cut bleeds a lot, or you have trouble  talking or breathing. This information is not intended to replace advice given to you by your health care provider. Make sure you discuss any questions you have with your healthcare provider. Document Revised: 07/03/2020 Document Reviewed: 07/03/2020 Elsevier Patient Education  2022 Elsevier Inc.     Moderate Conscious Sedation, Adult, Care After This sheet gives you information about how to care for yourself after your procedure. Your health care provider may also give you more specific instructions. If you have problems or questions, contact your health care provider. What can I expect after the procedure? After the procedure, it is common to have: Sleepiness for several hours. Impaired judgment for several hours. Difficulty with balance. Vomiting if you eat too soon. Follow these instructions at home: For the time period you were told by your health care provider: Rest. Do not participate in activities where you could fall or become injured. Do not drive or use machinery. Do not drink alcohol. Do not take sleeping pills or medicines that cause drowsiness. Do not make important decisions or sign legal documents. Do not take care of children on your own.        Eating and drinking Follow the diet recommended by your health care provider. Drink enough fluid to keep your urine pale yellow. If you vomit: Drink water, juice, or soup when you can drink without vomiting. Make sure you have little or no nausea before eating solid foods.    General instructions Take over-the-counter and prescription medicines only as told by your health care provider. Have a responsible adult stay with you for the time you are told. It is important to have someone help care for you until you are awake and alert. Do not smoke. Keep all follow-up visits as told by your health care provider. This is important. Contact a health care provider if: You are still sleepy or having trouble with balance after  24 hours. You feel light-headed. You keep feeling nauseous or you keep vomiting. You develop a rash. You have a fever. You have redness or swelling around the IV site. Get help right away if: You have trouble breathing. You have new-onset confusion at home. Summary After the procedure, it is common to feel sleepy, have impaired judgment, or feel nauseous if you eat too soon. Rest after you get home. Know the things you should not do after the procedure. Follow the diet recommended by your health care provider and drink enough fluid to keep your urine pale yellow. Get help right away if you have trouble breathing or new-onset confusion at home. This information is not intended to replace advice given to you by your health care provider. Make sure you discuss any questions you have with your health care provider. Document Revised: 12/17/2019 Document Reviewed: 07/15/2019 Elsevier Patient Education  2021 [employer].

## 2024-09-24 NOTE — Procedures (Signed)
Interventional Radiology Procedure Note ? ?Procedure: US Guided Biopsy of liver mass ? ?Complications: None ? ?Estimated Blood Loss: < 10 mL ? ?Findings: ?18 G core biopsy of right lobe liver mass performed under US guidance.  Three core samples obtained and sent to Pathology. ? ?[place] Jarquin T. Yaakov Saindon, M.D ?Pager:  [phone] ? ?  ?

## 2024-09-29 ENCOUNTER — Inpatient Hospital Stay: Attending: Oncology | Admitting: Oncology

## 2024-09-29 ENCOUNTER — Inpatient Hospital Stay

## 2024-09-29 ENCOUNTER — Encounter: Payer: Self-pay | Admitting: Oncology

## 2024-09-29 VITALS — BP 111/90 | HR 82 | Temp 97.2°F | Resp 17 | Wt 157.2 lb

## 2024-09-29 DIAGNOSIS — D649 Anemia, unspecified: Secondary | ICD-10-CM | POA: Diagnosis not present

## 2024-09-29 DIAGNOSIS — F32A Depression, unspecified: Secondary | ICD-10-CM | POA: Insufficient documentation

## 2024-09-29 DIAGNOSIS — I5042 Chronic combined systolic (congestive) and diastolic (congestive) heart failure: Secondary | ICD-10-CM | POA: Insufficient documentation

## 2024-09-29 DIAGNOSIS — Z888 Allergy status to other drugs, medicaments and biological substances status: Secondary | ICD-10-CM | POA: Insufficient documentation

## 2024-09-29 DIAGNOSIS — Z809 Family history of malignant neoplasm, unspecified: Secondary | ICD-10-CM | POA: Diagnosis not present

## 2024-09-29 DIAGNOSIS — Z87891 Personal history of nicotine dependence: Secondary | ICD-10-CM | POA: Diagnosis not present

## 2024-09-29 DIAGNOSIS — I110 Hypertensive heart disease with heart failure: Secondary | ICD-10-CM | POA: Diagnosis not present

## 2024-09-29 DIAGNOSIS — R160 Hepatomegaly, not elsewhere classified: Secondary | ICD-10-CM

## 2024-09-29 DIAGNOSIS — Z8619 Personal history of other infectious and parasitic diseases: Secondary | ICD-10-CM | POA: Insufficient documentation

## 2024-09-29 DIAGNOSIS — G893 Neoplasm related pain (acute) (chronic): Secondary | ICD-10-CM | POA: Diagnosis not present

## 2024-09-29 DIAGNOSIS — K7460 Unspecified cirrhosis of liver: Secondary | ICD-10-CM | POA: Insufficient documentation

## 2024-09-29 DIAGNOSIS — K7689 Other specified diseases of liver: Secondary | ICD-10-CM | POA: Diagnosis present

## 2024-09-29 DIAGNOSIS — E66812 Obesity, class 2: Secondary | ICD-10-CM | POA: Insufficient documentation

## 2024-09-29 DIAGNOSIS — Z7984 Long term (current) use of oral hypoglycemic drugs: Secondary | ICD-10-CM | POA: Diagnosis not present

## 2024-09-29 DIAGNOSIS — N400 Enlarged prostate without lower urinary tract symptoms: Secondary | ICD-10-CM | POA: Insufficient documentation

## 2024-09-29 DIAGNOSIS — E119 Type 2 diabetes mellitus without complications: Secondary | ICD-10-CM | POA: Insufficient documentation

## 2024-09-29 LAB — CBC WITH DIFFERENTIAL (CANCER CENTER ONLY)
Abs Immature Granulocytes: 0.04 10*3/uL (ref 0.00–0.07)
Basophils Absolute: 0 10*3/uL (ref 0.0–0.1)
Basophils Relative: 1 %
Eosinophils Absolute: 0 10*3/uL (ref 0.0–0.5)
Eosinophils Relative: 0 %
HCT: 40.7 % (ref 39.0–52.0)
Hemoglobin: 13.6 g/dL (ref 13.0–17.0)
Immature Granulocytes: 1 %
Lymphocytes Relative: 12 %
Lymphs Abs: 0.9 10*3/uL (ref 0.7–4.0)
MCH: 28 pg (ref 26.0–34.0)
MCHC: 33.4 g/dL (ref 30.0–36.0)
MCV: 83.9 fL (ref 80.0–100.0)
Monocytes Absolute: 0.6 10*3/uL (ref 0.1–1.0)
Monocytes Relative: 8 %
Neutro Abs: 5.9 10*3/uL (ref 1.7–7.7)
Neutrophils Relative %: 78 %
Platelet Count: 220 10*3/uL (ref 150–400)
RBC: 4.85 MIL/uL (ref 4.22–5.81)
RDW: 14.8 % (ref 11.5–15.5)
WBC Count: 7.4 10*3/uL (ref 4.0–10.5)
nRBC: 0 % (ref 0.0–0.2)

## 2024-09-29 LAB — IRON AND IRON BINDING CAPACITY (CC-WL,HP ONLY)
Iron: 29 ug/dL — ABNORMAL LOW (ref 45–182)
Saturation Ratios: 10 % — ABNORMAL LOW (ref 17.9–39.5)
TIBC: 295 ug/dL (ref 250–450)
UIBC: 267 ug/dL

## 2024-09-29 LAB — LACTATE DEHYDROGENASE: LDH: 1174 U/L — ABNORMAL HIGH (ref 105–235)

## 2024-09-29 LAB — CMP (CANCER CENTER ONLY)
ALT: 69 U/L — ABNORMAL HIGH (ref 0–44)
AST: 168 U/L — ABNORMAL HIGH (ref 15–41)
Albumin: 3.8 g/dL (ref 3.5–5.0)
Alkaline Phosphatase: 145 U/L — ABNORMAL HIGH (ref 38–126)
Anion gap: 17 — ABNORMAL HIGH (ref 5–15)
BUN: 18 mg/dL (ref 8–23)
CO2: 19 mmol/L — ABNORMAL LOW (ref 22–32)
Calcium: 11.6 mg/dL — ABNORMAL HIGH (ref 8.9–10.3)
Chloride: 100 mmol/L (ref 98–111)
Creatinine: 1.44 mg/dL — ABNORMAL HIGH (ref 0.61–1.24)
GFR, Estimated: 53 mL/min — ABNORMAL LOW
Glucose, Bld: 118 mg/dL — ABNORMAL HIGH (ref 70–99)
Potassium: 4.8 mmol/L (ref 3.5–5.1)
Sodium: 136 mmol/L (ref 135–145)
Total Bilirubin: 1.4 mg/dL — ABNORMAL HIGH (ref 0.0–1.2)
Total Protein: 8 g/dL (ref 6.5–8.1)

## 2024-09-29 LAB — FERRITIN: Ferritin: 275 ng/mL (ref 24–336)

## 2024-09-29 LAB — CEA (ACCESS): CEA (CHCC): 1.13 ng/mL (ref 0.00–5.00)

## 2024-09-29 MED ORDER — OXYCODONE HCL 5 MG PO TABS: 5.0000 mg | ORAL_TABLET | Freq: Four times a day (QID) | ORAL | 0 refills | Status: AC | PRN

## 2024-09-29 NOTE — Assessment & Plan Note (Signed)
 He experiences persistent pain due to the liver mass, inadequately controlled with tramadol , impacting oral intake and quality of life. Acetaminophen  and NSAIDs are contraindicated due to hepatic dysfunction and risk of gastrointestinal bleeding. - Prescribed oxycodone  5 mg, 1 tablet every 6 hours as needed for pain, with instructions to increase to 2 tablets if pain is not controlled. - Advised to avoid acetaminophen  and NSAIDs due to hepatotoxicity and gastrointestinal bleeding risk. - Discontinued tramadol  due to lack of efficacy.

## 2024-09-29 NOTE — Progress Notes (Signed)
 "  [facility]  ONCOLOGY CONSULT NOTE   PATIENT NAME: Todd Greer   MR#: [id number] DOB: May 22, 1956  DATE OF SERVICE: 09/29/2024   REFERRING PROVIDER  Gustav Mcgee, MD  Patient Care Team: Jaycee Greig PARAS, NP as PCP - General (Nurse Practitioner) Anner Alm ORN, MD as PCP - Cardiology (Cardiology) Mealor, Eulas BRAVO, MD as PCP - Electrophysiology (Cardiology) Rollin Dover, MD as Consulting Physician (Gastroenterology) Anner Alm ORN, MD as Consulting Physician (Cardiology) Gaston Hamilton, MD as Consulting Physician (Urology) Mcgee Gustav GAILS, MD as Consulting Physician (Gastroenterology) Autumn Millman, MD as Consulting Physician (Oncology)    CHIEF COMPLAINT/ PURPOSE OF CONSULTATION:   Liver mass, suspicious for multifocal hepatocellular carcinoma  ASSESSMENT & PLAN:   Todd Greer is a 69 y.o. gentleman with a past medical history of hypertension, hepatitis C treated with Harvoni approximately in 2015, type 2 diabetes mellitus, congestive heart failure, nonischemic cardiomyopathy, was referred to our clinic for recently diagnosed liver mass, suspicious for multifocal hepatocellular carcinoma.   Liver mass Please review HPI for additional details and timeline of events.  CT abdomen and pelvis was obtained on 09/07/2024. It showed Cirrhosis and mild perihepatic ascites. 14 cm heterogeneous mass involving the anterior right hepatic lobe and liver dome, with thrombus in the adjacent right portal vein, highly suspicious for tumor thrombus. A few other subtle foci of hyperenhancement in the right and left hepatic lobes, suspicious for other small hypervascular masses. These findings raise suspicion for multifocal hepatocellular carcinoma. Bulky abdominal lymphadenopathy, consistent with metastatic disease.  Of note he has longstanding history of liver cirrhosis and remote history of hepatitis C, treated with Harvoni approximately in 2015.  Apparently he went  into remission from hepatitis C standpoint.  We requested MRI liver protocol, CT chest, AFP.  Clinical picture concerning for multifocal hepatocellular carcinoma.  His case will be presented in multidisciplinary GI tumor conference for consensus opinion.  If confirmed to be hepatocellular carcinoma, he may not be a candidate for surgery or liver transplant given the size of the tumor and multifocal nature.  In such a case, we will proceed with systemic treatments using atezolizumab plus bevacizumab. Immunotherapy is favored as first-line therapy, with interventional radiology options to be reconsidered if there is response to systemic therapy.   - Scheduled follow-up in 1-2 weeks to review results and finalize treatment plan.  Cancer associated pain He experiences persistent pain due to the liver mass, inadequately controlled with tramadol , impacting oral intake and quality of life. Acetaminophen  and NSAIDs are contraindicated due to hepatic dysfunction and risk of gastrointestinal bleeding. - Prescribed oxycodone  5 mg, 1 tablet every 6 hours as needed for pain, with instructions to increase to 2 tablets if pain is not controlled. - Advised to avoid acetaminophen  and NSAIDs due to hepatotoxicity and gastrointestinal bleeding risk. - Discontinued tramadol  due to lack of efficacy.   Normocytic anemia He reports intermittent melena over the past month, suggesting possible gastrointestinal blood loss as an etiology for anemia. Recent upper endoscopy was unremarkable; colonoscopy revealed only diverticulosis and a small polyp. No acute bleeding source identified. - Ordered complete blood count and iron studies to evaluate for anemia and iron deficiency. - Reviewed recent colonoscopy and upper endoscopy findings; no acute bleeding source identified.  Cirrhosis of liver Cirrhosis, likely secondary to prior hepatitis C and alcohol use. He has been abstinent from alcohol for several years and completed  hepatitis C treatment over a decade ago. Cirrhosis is a significant risk factor for his  current liver mass. - Reviewed cirrhosis and hepatitis C treatment history. - Advised continued alcohol abstinence.  I reviewed lab results and outside records for this visit and discussed relevant results with the patient. Diagnosis, plan of care and treatment options were also discussed in detail with the patient. Opportunity provided to ask questions and answers provided to his apparent satisfaction. Provided instructions to call our clinic with any problems, questions or concerns prior to return visit. I recommended to continue follow-up with PCP and sub-specialists. He verbalized understanding and agreed with the plan. No barriers to learning was detected.  NCCN guidelines have been consulted in the planning of this patients care.  Chinita Patten, MD  09/29/2024 5:23 PM  [facility] - A DEPT OF [facility] [address] [place] [address] Dept: [phone] Dept Fax: [phone]   HISTORY OF PRESENTING ILLNESS:   I have reviewed his chart and materials related to his cancer extensively and collaborated history with the patient. Summary of oncologic history is as follows:  PERTINENT HISTORY:  Patient started having intermittent abdominal/epigastric pain and right flank pain since late November 2025.  He presented to the ED on 08/11/2024 with this complaint.  He was diagnosed with acid reflux and was prescribed omeprazole  and GI referral was sent.  Patient had consultation with Dr. Trenna office on 08/23/2024 and underwent EGD on 08/25/2024.  It showed benign appearing esophageal stenosis, which was dilated.  Esophageal mucosal changes suspicious for short segment Barrett's esophagus.  Evidence of gastritis.  Normal examined duodenum.  Pathology showed benign findings.  Because of persistent abdominal pain, CT abdomen and  pelvis was obtained on 09/07/2024. It showed Cirrhosis and mild perihepatic ascites. 14 cm heterogeneous mass involving the anterior right hepatic lobe and liver dome, with thrombus in the adjacent right portal vein, highly suspicious for tumor thrombus. A few other subtle foci of hyperenhancement in the right and left hepatic lobes, suspicious for other small hypervascular masses. These findings raise suspicion for multifocal hepatocellular carcinoma. Bulky abdominal lymphadenopathy, consistent with metastatic disease. Moderate to markedly enlarged prostate gland.  He was referred for liver biopsy, which was completed on 09/24/2024.  Pathology pending.  He was referred to us  for further evaluation and management and presented to our clinic on 09/29/2024 to establish care.  Of note he has longstanding history of liver cirrhosis and remote history of hepatitis C, treated with Harvoni approximately in 2015.  Apparently he went into remission from hepatitis C standpoint.  We requested MRI liver protocol, CT chest, AFP.  Clinical picture concerning for multifocal hepatocellular carcinoma.  His case will be presented in multidisciplinary GI tumor conference for consensus opinion.  If confirmed to be hepatocellular carcinoma, he may not be a candidate for surgery or liver transplant given the size of the tumor and multifocal nature.  In such a case, we will proceed with systemic treatments using atezolizumab plus bevacizumab.  INTERVAL HISTORY:  Discussed the use of AI scribe software for clinical note transcription with the patient, who gave verbal consent to proceed.  History of Present Illness Todd Greer is a 69 year old male with multifocal hepatocellular carcinoma, cirrhosis, and treated hepatitis C who presents for initial oncology evaluation and management of neoplasm-related pain.  He developed intermittent abdominal and back pain in early December, which has persisted and is associated with  abdominal bloating, early satiety, and distention. The pain is inadequately controlled with tramadol  and does not radiate.  He has not experienced weight loss, with stable weight at 157 lbs, but reports decreased oral intake due to early satiety and bloating, feeling full after small amounts of food or water.  He presented to the emergency department in early December and was started on antacid therapy. Upper endoscopy was unremarkable. Abdominal CT on September 07, 2024 revealed a large (14 cm) multifocal liver mass and prominent abdominal lymphadenopathy. Liver biopsy was performed last week, with results pending. He was not previously informed of the CT findings prior to this visit.  He has cirrhosis diagnosed 10-15 years ago and completed treatment for hepatitis C with Harvoni during the same period, achieving sustained virologic response. He discontinued alcohol consumption two years ago, having previously consumed liquor. He reports melena occurring twice weekly for the past month, which is abnormal for him. He denies iron supplementation. Colonoscopy in April 2025 revealed a small polyp and diverticulosis without other concerning findings. He has mild acid reflux, managed with omeprazole  and sucralfate .  He denies leg swelling, chest pain, shortness of breath, fevers, chills, or sweats. He experiences occasional nocturnal dysphagia, with sensation of food getting stuck, which is longstanding and unchanged.   MEDICAL HISTORY:  Past Medical History:  Diagnosis Date   Anxiety    Chronic combined systolic and diastolic congestive heart failure, NYHA class 2 (HCC) 09/2017   Has been maintained on excellent medications: Carvedilol  12.5 mg BID, Entresto  49 -51 mg p.o. BID, empagliflozin  10 mg daily, spironolactone  25 mg daily.   Diabetes mellitus type II, controlled, with no complications (HCC)    Hepatitis C, chronic (HCC)    Hypertension    Nonischemic congestive cardiomyopathy (HCC) 09/2017    Admitted for CHF: Echo EF 15 to 20% diffuse HK, severely dilated LA.  Mild RV dilation.  Mild MR.  Catheterization showed EF 25% normal EDP and normal coronaries (has been followed by Dr. 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  Diffuse HK.  Normal LVEDP.  Normal coronaries.   NM  EXERCISE MYOVIEW LTD  06/02/2013   Good exercise capacity.  Hypertensive response to exercise.  Dyspneic on exercise but no significant ST changes to suggest ischemia.  Normal stress images.  No ischemia or infarction   TRANSTHORACIC ECHOCARDIOGRAM  09/06/2017   EF 15 to 20%.  Diffuse HK.  Severe LA dilation.  Mild RV dilation.  Mild MR.    SOCIAL HISTORY: He reports that he quit smoking about 41 years ago. His smoking use included cigarettes. He started smoking about 51 years ago. He has a 5 pack-year smoking history. He has been exposed to tobacco smoke. He has never used smokeless tobacco. He reports that he does not currently use alcohol after a past usage of about 6.0 standard drinks of alcohol per week. He reports that he does not use drugs. Social History   Socioeconomic History   Marital status: Married    Spouse name: Not on file   Number of children: Not on file   Years of education: Not on file   Highest education level: Not  on file  Occupational History   Not on file  Tobacco Use   Smoking status: Former    Current packs/day: 0.00    Average packs/day: 0.5 packs/day for 10.0 years (5.0 ttl pk-yrs)    Types: Cigarettes    Start date: 10/03/1973    Quit date: 10/04/1983    Years since  quitting: 41.0    Passive exposure: Past   Smokeless tobacco: Never  Vaping Use   Vaping status: Never Used  Substance and Sexual Activity   Alcohol use: Not Currently    Alcohol/week: 6.0 standard drinks of alcohol    Types: 6 Standard drinks or equivalent per week   Drug use: No   Sexual activity: Yes  Other Topics Concern   Not on file  Social History Narrative   His married father of 4.  His married for 28 years.  He lives with his wife and daughters.  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  Education: [employer].   He quit smoking in 1985.  He drinks 3 beers a week.   Prior to the onset of his current symptoms, used to work out with weights for at least 40 minutes a day for 3 times a week.  He usually did light weights for many occasions as opposed to heavy weights.      Contacts: Wife Othel; daughter Zelda Sharps   Social Drivers of Health   Tobacco Use: Medium Risk (09/24/2024)   Patient History    Smoking Tobacco Use: Former    Smokeless Tobacco Use: Never    Passive Exposure: Past  Physicist, Medical Strain: Low Risk (08/04/2023)   Overall Financial Resource Strain (CARDIA)    Difficulty of Paying Living Expenses: Not hard at all  Food Insecurity: No Food Insecurity (08/04/2023)   Hunger Vital Sign    Worried About Running Out of Food in the Last Year: Never true    Ran Out of Food in the Last Year: Never true  Transportation Needs: No Transportation Needs (08/04/2023)   PRAPARE - Administrator, Civil Service (Medical): No    Lack of Transportation (Non-Medical): No  Physical Activity: Not on file  Stress: No Stress Concern Present (08/04/2023)   [employer] of Occupational Health - Occupational Stress Questionnaire    Feeling of Stress : Not at all  Social Connections: Socially Integrated (02/03/2024)   Social Connection and Isolation Panel    Frequency of Communication with Friends and Family: More  than three times a week    Frequency of Social Gatherings with Friends and Family: More than three times a week    Attends Religious Services: More than 4 times per year    Active Member of Clubs or Organizations: Yes    Attends Banker Meetings: More than 4 times per year    Marital Status: Married  Catering Manager Violence: Not At Risk (08/04/2023)   Humiliation, Afraid, Rape, and Kick questionnaire    Fear of Current or Ex-Partner: No    Emotionally Abused: No    Physically Abused: No    Sexually Abused: No  Depression (PHQ2-9): Low Risk (09/29/2024)   Depression (PHQ2-9)    PHQ-2 Score: 0  Alcohol Screen: Low Risk (08/04/2023)   Alcohol Screen    Last Alcohol Screening Score (AUDIT): 0  Housing: Low Risk (08/04/2023)   Housing    Last Housing Risk Score: 0  Utilities: Not At Risk (08/04/2023)   AHC Utilities  Threatened with loss of utilities: No  Health Literacy: Adequate Health Literacy (08/04/2023)   B1300 Health Literacy    Frequency of need for help with medical instructions: Never    FAMILY HISTORY: Family History  Problem Relation Age of Onset   Cerebral aneurysm Mother        Died at a young age.   Stroke Father    Diabetes Father    Hypertension Father    Cancer Sister    Diabetes Paternal Grandmother    Hypertension Paternal Grandmother    Stroke Paternal Grandmother    Colon cancer Neg Hx    Rectal cancer Neg Hx    Stomach cancer Neg Hx    Esophageal cancer Neg Hx    Colon polyps Neg Hx     ALLERGIES:  He is allergic to amlodipine , ace inhibitors, and lisinopril .  MEDICATIONS:  Current Outpatient Medications  Medication Sig Dispense Refill   atorvastatin  (LIPITOR) 20 MG tablet Take 1 tablet (20 mg total) by mouth daily. 90 tablet 0   Cholecalciferol  (VITAMIN D ) 50 MCG (2000 UT) CAPS Take by mouth.     Cyanocobalamin  (VITAMIN B-12 CR) 1500 MCG TBCR Take by mouth.     diltiazem  (CARDIZEM  CD) 240 MG 24 hr capsule Take 1 capsule (240 mg  total) by mouth daily. 90 capsule 2   ENTRESTO  49-51 MG Take 1 tablet by mouth 2 (two) times daily. 180 tablet 2   Magnesium  400 MG TABS Take 2 tablets by mouth. Name is Magnesium  Taurate     Multiple Vitamin (MULTIVITAMIN) tablet Take 1 tablet by mouth daily.     omeprazole  (PRILOSEC) 40 MG capsule Take 1 capsule (40 mg total) by mouth daily. 30 capsule 3   oxyCODONE  (OXY IR/ROXICODONE ) 5 MG immediate release tablet Take 1 tablet (5 mg total) by mouth every 6 (six) hours as needed for severe pain (pain score 7-10). 60 tablet 0   spironolactone  (ALDACTONE ) 25 MG tablet Take 1 tablet (25 mg total) by mouth daily. 90 tablet 0   sucralfate  (CARAFATE ) 1 GM/10ML suspension Take 10 mLs (1 g total) by mouth 4 (four) times daily. 420 mL 1   tadalafil  (CIALIS ) 20 MG tablet Take 1 tablet (20 mg total) by mouth daily as needed. 10 tablet 11   traMADol  (ULTRAM ) 50 MG tablet Take 1 tablet (50 mg total) by mouth every 8 (eight) hours as needed. 20 tablet 0   No current facility-administered medications for this visit.    REVIEW OF SYSTEMS:    Review of Systems - Oncology  All other pertinent systems were reviewed with the patient and are negative.  PHYSICAL EXAMINATION:   Onc Performance Status - 09/29/24 1101       ECOG Perf Status   ECOG Perf Status Ambulatory and capable of all selfcare but unable to carry out any work activities.  Up and about more than 50% of waking hours      KPS SCALE   KPS % SCORE Requires considerable assistance, and frequent medical care          Vitals:   09/29/24 1051  BP: (!) 111/90  Pulse: 82  Resp: 17  Temp: (!) 97.2 F (36.2 C)  SpO2: 97%   Filed Weights   09/29/24 1051  Weight: 157 lb 3.2 oz (71.3 kg)    Physical Exam Constitutional:      General: He is not in acute distress.    Appearance: Normal appearance.  HENT:     Head:  Normocephalic and atraumatic.  Eyes:     Conjunctiva/sclera: Conjunctivae normal.  Cardiovascular:     Rate and  Rhythm: Normal rate and regular rhythm.     Heart sounds: Normal heart sounds.  Pulmonary:     Effort: Pulmonary effort is normal. No respiratory distress.     Breath sounds: Normal breath sounds.  Abdominal:     General: There is distension.     Palpations: There is mass.  Musculoskeletal:     Right lower leg: No edema.     Left lower leg: No edema.  Lymphadenopathy:     Cervical: No cervical adenopathy.  Neurological:     General: No focal deficit present.     Mental Status: He is alert and oriented to person, place, and time.  Psychiatric:        Mood and Affect: Mood normal.        Behavior: Behavior normal.      LABORATORY DATA:   I have reviewed the data as listed.  Results for orders placed or performed in visit on 09/29/24  Ferritin  Result Value Ref Range   Ferritin 275 24 - 336 ng/mL  Iron and Iron Binding Capacity ([facility] only)  Result Value Ref Range   Iron 29 (L) 45 - 182 ug/dL   TIBC 704 749 - 549 ug/dL   Saturation Ratios 10 (L) 17.9 - 39.5 %   UIBC 267 ug/dL  Lactate dehydrogenase  Result Value Ref Range   LDH 1,174 (H) 105 - 235 U/L  CMP (Cancer Center only)  Result Value Ref Range   Sodium 136 135 - 145 mmol/L   Potassium 4.8 3.5 - 5.1 mmol/L   Chloride 100 98 - 111 mmol/L   CO2 19 (L) 22 - 32 mmol/L   Glucose, Bld 118 (H) 70 - 99 mg/dL   BUN 18 8 - 23 mg/dL   Creatinine 8.55 (H) 9.38 - 1.24 mg/dL   Calcium  11.6 (H) 8.9 - 10.3 mg/dL   Total Protein 8.0 6.5 - 8.1 g/dL   Albumin 3.8 3.5 - 5.0 g/dL   AST 831 (H) 15 - 41 U/L   ALT 69 (H) 0 - 44 U/L   Alkaline Phosphatase 145 (H) 38 - 126 U/L   Total Bilirubin 1.4 (H) 0.0 - 1.2 mg/dL   GFR, Estimated 53 (L) >60 mL/min   Anion gap 17 (H) 5 - 15  CBC with Differential (Cancer Center Only)  Result Value Ref Range   WBC Count 7.4 4.0 - 10.5 K/uL   RBC 4.85 4.22 - 5.81 MIL/uL   Hemoglobin 13.6 13.0 - 17.0 g/dL   HCT 59.2 60.9 - 47.9 %   MCV 83.9 80.0 - 100.0 fL   MCH 28.0 26.0 - 34.0 pg    MCHC 33.4 30.0 - 36.0 g/dL   RDW 85.1 88.4 - 84.4 %   Platelet Count 220 150 - 400 K/uL   nRBC 0.0 0.0 - 0.2 %   Neutrophils Relative % 78 %   Neutro Abs 5.9 1.7 - 7.7 K/uL   Lymphocytes Relative 12 %   Lymphs Abs 0.9 0.7 - 4.0 K/uL   Monocytes Relative 8 %   Monocytes Absolute 0.6 0.1 - 1.0 K/uL   Eosinophils Relative 0 %   Eosinophils Absolute 0.0 0.0 - 0.5 K/uL   Basophils Relative 1 %   Basophils Absolute 0.0 0.0 - 0.1 K/uL   Immature Granulocytes 1 %   Abs Immature Granulocytes 0.04 0.00 - 0.07 K/uL  Lab Results  Component Value Date   WBC 7.4 09/29/2024   HGB 13.6 09/29/2024   HCT 40.7 09/29/2024   MCV 83.9 09/29/2024   PLT 220 09/29/2024   Recent Labs    02/03/24 1041 08/11/24 0147 08/11/24 0200 09/29/24 1200  NA 140 135 136 136  K 4.2 4.2 4.2 4.8  CL 102 101 102 100  CO2 21 23  --  19*  GLUCOSE 83 153* 150* 118*  BUN 10 13 14 18   CREATININE 0.79 0.97 0.90 1.44*  CALCIUM  9.3 9.6  --  11.6*  GFRNONAA  --  >60  --  53*  PROT 7.0  --   --  8.0  ALBUMIN 4.1  --   --  3.8  AST 40  --   --  168*  ALT 34  --   --  69*  ALKPHOS 85  --   --  145*  BILITOT 0.5  --   --  1.4*    RADIOGRAPHIC STUDIES:  I have personally reviewed the radiological images as listed and agree with the findings in the report.  IR US  LIVER BIOPSY Result Date: 09/24/2024 CLINICAL DATA:  14 cm hepatics mass in the right lobe of the liver with associated right portal vein thrombus. The patient presents for liver biopsy. EXAM: ULTRASOUND GUIDED CORE BIOPSY OF BODY PART MEDICATIONS: Moderate (conscious) sedation was employed during this procedure. A total of Versed  1.5 mg and Fentanyl  75 mcg was administered intravenously. Moderate Sedation Time: 10 minutes. The patient's level of consciousness and vital signs were monitored continuously by radiology nursing throughout the procedure under my direct supervision. PROCEDURE: The procedure, risks, benefits, and alternatives were explained to the  patient. Questions regarding the procedure were encouraged and answered. The patient understands and consents to the procedure. A time out was performed prior to initiating the procedure. The abdominal wall was prepped with chlorhexidine in a sterile fashion, and a sterile drape was applied covering the operative field. A sterile gown and sterile gloves were used for the procedure. Local anesthesia was provided with 1% Lidocaine . Ultrasound was used to localize a mass within the right lobe of the liver. The patient was placed in an oblique position with the right side rolled up slightly. Under ultrasound guidance, a 17 gauge trocar needle was advanced into a right lobe liver mass. After confirming needle tip position, 3 separate coaxial 18 gauge core biopsy samples were obtained and submitted in formalin. A slurry of Gel-Foam EmboCubes mixed in sterile saline was then injected via the outer needle as the needle was retracted and removed. COMPLICATIONS: None. FINDINGS: Ultrasound demonstrates a huge heterogeneous mass occupying most of the right lobe of the liver. There is a small amount of ascites surrounding the liver. When the patient was placed in an oblique position, most of the ascites adjacent to the right lobe was able to be shifted away from the biopsy entry site. Solid tissue was obtained. IMPRESSION: Ultrasound-guided core biopsy performed of a huge right lobe hepatic mass. Electronically Signed   By: Marcey Moan M.D.   On: 09/24/2024 16:10   CT ABDOMEN PELVIS W CONTRAST Result Date: 09/12/2024 CLINICAL DATA:  Epigastric abdominal pain.  * Tracking Code: BO * EXAM: CT ABDOMEN AND PELVIS WITH CONTRAST TECHNIQUE: Multidetector CT imaging of the abdomen and pelvis was performed using the standard protocol following bolus administration of intravenous contrast. RADIATION DOSE REDUCTION: This exam was performed according to the departmental dose-optimization program which includes automated exposure  control, adjustment of the mA and/or kV according to patient size and/or use of iterative reconstruction technique. CONTRAST:  OMNIPAQUE  IOHEXOL  300 MG/ML  SOLN COMPARISON:  07/06/2021 FINDINGS: Lower Chest: Tiny right pleural effusion noted. Hepatobiliary: Hepatic capsular nodularity noted, consistent with cirrhosis. Mild perihepatic ascites noted. A large heterogeneous, predominantly low-attenuation mass is seen involving the anterior right hepatic lobe and liver dome, which measures 14.0 x 13.0 cm. Thrombus is seen in the adjacent right portal vein, highly suspicious for tumor thrombus. A few other tiny subtle foci of hyperenhancement are seen in the right and left hepatic lobes which may represent other small hypervascular masses. These findings raise suspicion for multifocal hepatocellular carcinoma. Gallbladder is unremarkable. No evidence of biliary ductal dilatation. Pancreas:  No mass or inflammatory changes. Spleen: Within normal limits in size and appearance. Adrenals/Urinary Tract: No suspicious masses identified. No evidence of ureteral calculi or hydronephrosis. Stomach/Bowel: No evidence of obstruction, inflammatory process or abnormal fluid collections. Normal appendix visualized. Vascular/Lymphatic: Bulky lymphadenopathy is seen in the porta hepatis, portacaval, and aortocaval spaces, with largest conglomerate mass measuring 8.4 x 6.5 cm. Left abdominal retroperitoneal lymphadenopathy is also seen adjacent to the superior mesenteric artery measuring 4.0 x 3.7 cm on image 33/301. Mild lymphadenopathy between the stomach and pancreatic tail measures 3.1 x 1.8 cm on image 26/301. Retroperitoneal lymphadenopathy seen just inferior to the pancreatic uncinate process measures 4.1 x 2.3 cm on image 46/301. No pelvic lymphadenopathy identified. Reproductive: Moderate to markedly enlarged prostate gland seen, with mass effect on bladder base. Other:  None. Musculoskeletal:  No suspicious bone lesions  identified. IMPRESSION: Cirrhosis and mild perihepatic ascites. 14 cm heterogeneous mass involving the anterior right hepatic lobe and liver dome, with thrombus in the adjacent right portal vein, highly suspicious for tumor thrombus. A few other subtle foci of hyperenhancement in the right and left hepatic lobes, suspicious for other small hypervascular masses. These findings raise suspicion for multifocal hepatocellular carcinoma. Bulky abdominal lymphadenopathy, consistent with metastatic disease. Moderate to markedly enlarged prostate gland. These results will be called to the ordering clinician or representative by the Radiologist Assistant, and communication documented in the PACS or [employer]. Electronically Signed   By: Norleen DELENA Kil M.D.   On: 09/12/2024 15:04    Orders Placed This Encounter  Procedures   MR LIVER W WO CONTRAST    Standing Status:   Future    Expected Date:   10/04/2024    Expiration Date:   09/29/2025    If indicated for the ordered procedure, I authorize the administration of contrast media per Radiology protocol:   Yes    What is the patient's sedation requirement?:   No Sedation    Does the patient have a pacemaker or implanted devices?:   No    Preferred imaging location?:   [facility] (table limit - 500lbs)   CT Chest W Contrast    Standing Status:   Future    Expected Date:   10/04/2024    Expiration Date:   09/29/2025    If indicated for the ordered procedure, I authorize the administration of contrast media per Radiology protocol:   Yes    Does the patient have a contrast media/X-ray dye allergy?:   No    Preferred imaging location?:   [facility]   CBC with Differential (Cancer Center Only)    Standing Status:   Future    Number of Occurrences:   1    Expiration Date:  09/29/2025   CMP (Cancer Center only)    Standing Status:   Future    Number of Occurrences:   1    Expiration Date:   09/29/2025   Lactate dehydrogenase    Standing  Status:   Future    Number of Occurrences:   1    Expiration Date:   09/29/2025   Iron and Iron Binding Capacity ([facility] only)    Standing Status:   Future    Number of Occurrences:   1    Expiration Date:   09/29/2025   Ferritin    Standing Status:   Future    Number of Occurrences:   1    Expiration Date:   09/29/2025   AFP tumor marker    Standing Status:   Future    Number of Occurrences:   1    Expiration Date:   09/29/2025   Cancer antigen 19-9    Standing Status:   Future    Number of Occurrences:   1    Expiration Date:   09/29/2025   CEA (Access)    Standing Status:   Future    Number of Occurrences:   1    Expiration Date:   09/29/2025    CODE STATUS:  Code Status History     Date Active Date Inactive Code Status Order ID Comments User Context   09/24/2024 1103 09/25/2024 0516 Full Code [id number]  Luverne Aran, MD [facility]   09/05/2017 1953 09/09/2017 1653 Full Code [id number]  Charlton Evalene RAMAN, MD ED    Questions for Most Recent Historical Code Status (Order [id number])     Question Answer   By: Procedural case: previous code status reviewed            Future Appointments  Date Time Provider Department Center  03/09/2025  8:40 AM Jaycee Greig PARAS, NP [facility] [address]     I spent a total of 70 minutes during this encounter with the patient including review of chart and various tests results, discussions about plan of care and coordination of care plan.  This document was completed utilizing speech recognition software. Grammatical errors, random word insertions, pronoun errors, and incomplete sentences are an occasional consequence of this system due to software limitations, ambient noise, and hardware issues. Any formal questions or concerns about the content, text or information contained within the body of this dictation should be directly addressed to the provider for clarification.  "

## 2024-09-29 NOTE — Assessment & Plan Note (Signed)
 Please review HPI for additional details and timeline of events.  CT abdomen and pelvis was obtained on 09/07/2024. It showed Cirrhosis and mild perihepatic ascites. 14 cm heterogeneous mass involving the anterior right hepatic lobe and liver dome, with thrombus in the adjacent right portal vein, highly suspicious for tumor thrombus. A few other subtle foci of hyperenhancement in the right and left hepatic lobes, suspicious for other small hypervascular masses. These findings raise suspicion for multifocal hepatocellular carcinoma. Bulky abdominal lymphadenopathy, consistent with metastatic disease.  Of note he has longstanding history of liver cirrhosis and remote history of hepatitis C, treated with Harvoni approximately in 2015.  Apparently he went into remission from hepatitis C standpoint.  We requested MRI liver protocol, CT chest, AFP.  Clinical picture concerning for multifocal hepatocellular carcinoma.  His case will be presented in multidisciplinary GI tumor conference for consensus opinion.  If confirmed to be hepatocellular carcinoma, he may not be a candidate for surgery or liver transplant given the size of the tumor and multifocal nature.  In such a case, we will proceed with systemic treatments using atezolizumab plus bevacizumab. Immunotherapy is favored as first-line therapy, with interventional radiology options to be reconsidered if there is response to systemic therapy.   - Scheduled follow-up in 1-2 weeks to review results and finalize treatment plan.

## 2024-09-30 ENCOUNTER — Ambulatory Visit: Payer: Self-pay | Admitting: Gastroenterology

## 2024-09-30 LAB — AFP TUMOR MARKER: AFP, Serum, Tumor Marker: 12.8 ng/mL — ABNORMAL HIGH (ref 0.0–8.4)

## 2024-09-30 LAB — CANCER ANTIGEN 19-9: CA 19-9: 330 U/mL — ABNORMAL HIGH (ref 0–35)

## 2024-09-30 LAB — SURGICAL PATHOLOGY

## 2024-10-05 ENCOUNTER — Telehealth: Payer: Self-pay | Admitting: Oncology

## 2024-10-06 ENCOUNTER — Other Ambulatory Visit: Payer: Self-pay

## 2024-10-06 NOTE — Progress Notes (Signed)
 The proposed treatment discussed in conference is for discussion purpose only and is not a binding recommendation.  The patients have not been physically examined, or presented with their treatment options.  Therefore, final treatment plans cannot be decided.

## 2024-10-08 ENCOUNTER — Other Ambulatory Visit: Payer: Self-pay

## 2024-10-08 ENCOUNTER — Emergency Department (HOSPITAL_COMMUNITY)

## 2024-10-08 ENCOUNTER — Encounter (HOSPITAL_COMMUNITY): Payer: Self-pay | Admitting: Internal Medicine

## 2024-10-08 ENCOUNTER — Inpatient Hospital Stay (HOSPITAL_COMMUNITY): Admission: EM | Admit: 2024-10-08 | Source: Home / Self Care | Admitting: Internal Medicine

## 2024-10-08 ENCOUNTER — Telehealth: Payer: Self-pay

## 2024-10-08 DIAGNOSIS — G893 Neoplasm related pain (acute) (chronic): Secondary | ICD-10-CM

## 2024-10-08 DIAGNOSIS — C220 Liver cell carcinoma: Principal | ICD-10-CM

## 2024-10-08 DIAGNOSIS — E875 Hyperkalemia: Secondary | ICD-10-CM | POA: Diagnosis present

## 2024-10-08 DIAGNOSIS — N179 Acute kidney failure, unspecified: Secondary | ICD-10-CM

## 2024-10-08 LAB — CBC
HCT: 45.1 % (ref 39.0–52.0)
Hemoglobin: 14.7 g/dL (ref 13.0–17.0)
MCH: 28.1 pg (ref 26.0–34.0)
MCHC: 32.6 g/dL (ref 30.0–36.0)
MCV: 86.2 fL (ref 80.0–100.0)
Platelets: 192 10*3/uL (ref 150–400)
RBC: 5.23 MIL/uL (ref 4.22–5.81)
RDW: 14.7 % (ref 11.5–15.5)
WBC: 13.1 10*3/uL — ABNORMAL HIGH (ref 4.0–10.5)
nRBC: 0 % (ref 0.0–0.2)

## 2024-10-08 LAB — URINALYSIS, ROUTINE W REFLEX MICROSCOPIC
Bilirubin Urine: NEGATIVE
Glucose, UA: NEGATIVE mg/dL
Hgb urine dipstick: NEGATIVE
Ketones, ur: 5 mg/dL — AB
Leukocytes,Ua: NEGATIVE
Nitrite: NEGATIVE
Protein, ur: NEGATIVE mg/dL
Specific Gravity, Urine: 1.021 (ref 1.005–1.030)
pH: 5 (ref 5.0–8.0)

## 2024-10-08 LAB — COMPREHENSIVE METABOLIC PANEL WITH GFR
ALT: 76 U/L — ABNORMAL HIGH (ref 0–44)
AST: 230 U/L — ABNORMAL HIGH (ref 15–41)
Albumin: 3.3 g/dL — ABNORMAL LOW (ref 3.5–5.0)
Alkaline Phosphatase: 129 U/L — ABNORMAL HIGH (ref 38–126)
Anion gap: 14 (ref 5–15)
BUN: 49 mg/dL — ABNORMAL HIGH (ref 8–23)
CO2: 22 mmol/L (ref 22–32)
Calcium: 13.7 mg/dL (ref 8.9–10.3)
Chloride: 93 mmol/L — ABNORMAL LOW (ref 98–111)
Creatinine, Ser: 2.05 mg/dL — ABNORMAL HIGH (ref 0.61–1.24)
GFR, Estimated: 35 mL/min — ABNORMAL LOW
Glucose, Bld: 79 mg/dL (ref 70–99)
Potassium: 5.7 mmol/L — ABNORMAL HIGH (ref 3.5–5.1)
Sodium: 130 mmol/L — ABNORMAL LOW (ref 135–145)
Total Bilirubin: 2.6 mg/dL — ABNORMAL HIGH (ref 0.0–1.2)
Total Protein: 8 g/dL (ref 6.5–8.1)

## 2024-10-08 LAB — BASIC METABOLIC PANEL WITH GFR
Anion gap: 10 (ref 5–15)
BUN: 45 mg/dL — ABNORMAL HIGH (ref 8–23)
CO2: 23 mmol/L (ref 22–32)
Calcium: 12.6 mg/dL — ABNORMAL HIGH (ref 8.9–10.3)
Chloride: 99 mmol/L (ref 98–111)
Creatinine, Ser: 1.95 mg/dL — ABNORMAL HIGH (ref 0.61–1.24)
GFR, Estimated: 37 mL/min — ABNORMAL LOW
Glucose, Bld: 67 mg/dL — ABNORMAL LOW (ref 70–99)
Potassium: 5.8 mmol/L — ABNORMAL HIGH (ref 3.5–5.1)
Sodium: 131 mmol/L — ABNORMAL LOW (ref 135–145)

## 2024-10-08 LAB — LIPASE, BLOOD: Lipase: 78 U/L — ABNORMAL HIGH (ref 11–51)

## 2024-10-08 LAB — MAGNESIUM: Magnesium: 2.6 mg/dL — ABNORMAL HIGH (ref 1.7–2.4)

## 2024-10-08 LAB — AMMONIA: Ammonia: 29 umol/L (ref 9–35)

## 2024-10-08 MED ORDER — PANTOPRAZOLE SODIUM 40 MG PO TBEC: 80.0000 mg | DELAYED_RELEASE_TABLET | Freq: Every day | ORAL | Status: AC

## 2024-10-08 MED ORDER — ENOXAPARIN SODIUM 40 MG/0.4ML IJ SOSY
40.0000 mg | PREFILLED_SYRINGE | INTRAMUSCULAR | Status: AC
  Administered 2024-10-09: 40 mg via SUBCUTANEOUS
  Filled 2024-10-08: qty 0.4

## 2024-10-08 MED ORDER — ONDANSETRON HCL 4 MG PO TABS: 4.0000 mg | ORAL_TABLET | Freq: Four times a day (QID) | ORAL | Status: AC | PRN

## 2024-10-08 MED ORDER — SODIUM ZIRCONIUM CYCLOSILICATE 10 G PO PACK
10.0000 g | PACK | Freq: Once | ORAL | Status: AC
  Administered 2024-10-08: 10 g via ORAL
  Filled 2024-10-08: qty 1

## 2024-10-08 MED ORDER — SODIUM CHLORIDE 0.9 % IV BOLUS
1000.0000 mL | Freq: Once | INTRAVENOUS | Status: AC
  Administered 2024-10-08: 1000 mL via INTRAVENOUS

## 2024-10-08 MED ORDER — SODIUM CHLORIDE 0.9 % IV SOLN: INTRAVENOUS | Status: AC

## 2024-10-08 MED ORDER — SODIUM CHLORIDE 0.9% FLUSH: 3.0000 mL | Freq: Two times a day (BID) | INTRAVENOUS | Status: AC

## 2024-10-08 MED ORDER — FENTANYL CITRATE (PF) 50 MCG/ML IJ SOSY
100.0000 ug | PREFILLED_SYRINGE | Freq: Once | INTRAMUSCULAR | Status: AC
  Administered 2024-10-08: 100 ug via INTRAVENOUS
  Filled 2024-10-08: qty 2

## 2024-10-08 MED ORDER — ONDANSETRON HCL 4 MG/2ML IJ SOLN: 4.0000 mg | Freq: Four times a day (QID) | INTRAMUSCULAR | Status: AC | PRN

## 2024-10-08 MED ORDER — OXYCODONE HCL 5 MG PO TABS: 5.0000 mg | ORAL_TABLET | ORAL | Status: DC | PRN

## 2024-10-08 MED ORDER — HYDROMORPHONE HCL 1 MG/ML IJ SOLN: 0.5000 mg | INTRAMUSCULAR | Status: AC | PRN

## 2024-10-08 MED ORDER — OXYCODONE HCL 5 MG PO TABS: 5.0000 mg | ORAL_TABLET | ORAL | Status: AC | PRN

## 2024-10-08 MED ORDER — BISACODYL 5 MG PO TBEC: 5.0000 mg | DELAYED_RELEASE_TABLET | Freq: Every day | ORAL | Status: AC | PRN

## 2024-10-08 MED ORDER — MELATONIN 3 MG PO TABS: 3.0000 mg | ORAL_TABLET | Freq: Every evening | ORAL | Status: AC | PRN

## 2024-10-08 NOTE — ED Provider Notes (Cosign Needed)
 " [place] EMERGENCY DEPARTMENT AT [facility] Provider Note   CSN: [id number] Arrival date & time: 10/08/24  1254     Patient presents with: Dehydration   Todd Greer is a 69 y.o. male.   Patient presents with family with a past medical history of hypertension, hepatitis C treated with Harvoni approximately in 2015, type 2 diabetes mellitus, congestive heart failure, nonischemic cardiomyopathy, recently diagnosed liver mass (CT 09/12/24 after unrevealing EGD 08/25/24), suspicious for multifocal hepatocellular carcinoma --for evaluation of worsening weakness, concern for dehydration.  Patient has continued to decline, especially over the past week.  Family reports that his speech has been less strong, difficult to understand, confused at times.  In addition, he has been eating and drinking less.  When the patient is asked, he reports a lot of pain in the upper abdomen, worse with eating and drinking.  He denies choking or regurgitation.  No diarrhea.  Patient reports decreased urination.  No lower extremity swelling.  No vomiting.  Patient has yet to start treatment for the liver mass.  Family member reports follow-up appointment next week to discuss plan.  It appears that patient's case was recently discussed at tumor board.        Prior to Admission medications  Medication Sig Start Date End Date Taking? Authorizing Provider  atorvastatin  (LIPITOR) 20 MG tablet Take 1 tablet (20 mg total) by mouth daily. 01/21/24   Jaycee Greig PARAS, NP  Cholecalciferol  (VITAMIN D ) 50 MCG (2000 UT) CAPS Take by mouth.    [provider]  Cyanocobalamin  (VITAMIN B-12 CR) 1500 MCG TBCR Take by mouth.    [provider]  diltiazem  (CARDIZEM  CD) 240 MG 24 hr capsule Take 1 capsule (240 mg total) by mouth daily. 01/06/24   Mealor, Augustus E, MD  ENTRESTO  49-51 MG Take 1 tablet by mouth 2 (two) times daily. 01/14/24   Anner Alm ORN, MD  Magnesium  400 MG TABS Take 2 tablets by mouth.  Name is Magnesium  Taurate    [provider]  Multiple Vitamin (MULTIVITAMIN) tablet Take 1 tablet by mouth daily.    [provider]  omeprazole  (PRILOSEC) 40 MG capsule Take 1 capsule (40 mg total) by mouth daily. 08/23/24   Zehr, Jessica D, PA-C  oxyCODONE  (OXY IR/ROXICODONE ) 5 MG immediate release tablet Take 1 tablet (5 mg total) by mouth every 6 (six) hours as needed for severe pain (pain score 7-10). 09/29/24   Pasam, Avinash, MD  spironolactone  (ALDACTONE ) 25 MG tablet Take 1 tablet (25 mg total) by mouth daily. 01/23/24   Tanda Bleacher, MD  sucralfate  (CARAFATE ) 1 GM/10ML suspension Take 10 mLs (1 g total) by mouth 4 (four) times daily. 08/23/24   Zehr, Jessica D, PA-C  tadalafil  (CIALIS ) 20 MG tablet Take 1 tablet (20 mg total) by mouth daily as needed. 04/21/24   Stoneking, Adine PARAS., MD  traMADol  (ULTRAM ) 50 MG tablet Take 1 tablet (50 mg total) by mouth every 8 (eight) hours as needed. 09/14/24   Zehr, Jessica D, PA-C    Allergies: Amlodipine , Ace inhibitors, and Lisinopril     Review of Systems  Updated Vital Signs BP 105/75 (BP Location: Left Arm)   Pulse (!) 50   Resp 18   SpO2 95%   Physical Exam Vitals and nursing note reviewed.  Constitutional:      General: He is not in acute distress.    Appearance: He is well-developed.  HENT:     Head: Normocephalic and atraumatic.  Right Ear: External ear normal.     Left Ear: External ear normal.     Nose: Nose normal.     Mouth/Throat:     Mouth: Mucous membranes are dry.     Comments: Dry mucous membranes Eyes:     General:        Right eye: No discharge.        Left eye: No discharge.     Conjunctiva/sclera: Conjunctivae normal.  Cardiovascular:     Rate and Rhythm: Normal rate. Rhythm irregular.     Heart sounds: Normal heart sounds.  Pulmonary:     Effort: Pulmonary effort is normal.     Breath sounds: Normal breath sounds.  Abdominal:     Palpations: Abdomen is soft.     Tenderness: There  is abdominal tenderness (Upper abdomen). There is no guarding or rebound.  Musculoskeletal:     Cervical back: Normal range of motion and neck supple.  Skin:    General: Skin is warm and dry.  Neurological:     Mental Status: He is alert.     Comments: Very soft speech.  Patient gives minimal response to questions, but answers seem appropriate.  No focal weakness, generalized weakness.     (all labs ordered are listed, but only abnormal results are displayed) Labs Reviewed  LIPASE, BLOOD - Abnormal; Notable for the following components:      Result Value   Lipase 78 (*)    All other components within normal limits  COMPREHENSIVE METABOLIC PANEL WITH GFR - Abnormal; Notable for the following components:   Sodium 130 (*)    Potassium 5.7 (*)    Chloride 93 (*)    BUN 49 (*)    Creatinine, Ser 2.05 (*)    Calcium  13.7 (*)    Albumin 3.3 (*)    AST 230 (*)    ALT 76 (*)    Alkaline Phosphatase 129 (*)    Total Bilirubin 2.6 (*)    GFR, Estimated 35 (*)    All other components within normal limits  CBC - Abnormal; Notable for the following components:   WBC 13.1 (*)    All other components within normal limits  MAGNESIUM  - Abnormal; Notable for the following components:   Magnesium  2.6 (*)    All other components within normal limits  AMMONIA  URINALYSIS, ROUTINE W REFLEX MICROSCOPIC    EKG: EKG Interpretation Date/Time:  Friday October 08 2024 13:16:55 EST Ventricular Rate:  117 PR Interval:  181 QRS Duration:  105 QT Interval:  303 QTC Calculation: 410 R Axis:   -62  Text Interpretation: Sinus tachycardia Aberrant conduction of SV complex(es) Inferolateral infarct, acute Borderline ST elevation, anterior leads Confirmed by Mannie Pac [phone]) on 10/08/2024 3:19:16 PM  Radiology: CT Head Wo Contrast Result Date: 10/08/2024 EXAM: CT HEAD WITHOUT CONTRAST 10/08/2024 03:08:10 PM TECHNIQUE: CT of the head was performed without the administration of intravenous  contrast. Automated exposure control, iterative reconstruction, and/or weight based adjustment of the mA/kV was utilized to reduce the radiation dose to as low as reasonably achievable. COMPARISON: CT head 07/06/2021. CLINICAL HISTORY: Weakness, speech changes, recent dx hepatocellular carcinoma. Weakness and speech changes; recent diagnosis of hepatocellular cancer. FINDINGS: BRAIN AND VENTRICLES: No acute hemorrhage. No evidence of acute infarct. No hydrocephalus. No extra-axial collection. No mass effect or midline shift. There is overall mild scattered white matter hypodensities which are nonspecific but most commonly represent chronic microvascular ischemic changes. SINUSES: No acute abnormality. SOFT TISSUES  AND SKULL: No acute soft tissue abnormality. No skull fracture. IMPRESSION: 1. No acute intracranial abnormality. Electronically signed by: Prentice Spade MD 10/08/2024 03:21 PM EST RP Workstation: [id number]     Procedures   Medications Ordered in the ED  0.9 %  sodium chloride  infusion (has no administration in time range)  sodium chloride  0.9 % bolus 1,000 mL (1,000 mLs Intravenous New Bag/Given 10/08/24 1453)  fentaNYL  (SUBLIMAZE ) injection 100 mcg (100 mcg Intravenous Given 10/08/24 1450)  sodium zirconium cyclosilicate  (LOKELMA ) packet 10 g (10 g Oral Given 10/08/24 1542)  sodium chloride  0.9 % bolus 1,000 mL (1,000 mLs Intravenous New Bag/Given 10/08/24 1545)   ED Course  Patient seen and examined. History obtained directly from patient and family.  I reviewed recent ED notes, GI workup and evaluation, reviewed CT results and oncology notes.  Labs/EKG: Ordered CBC, CMP, lipase, ammonia.  Imaging: Ordered CT head.  Medications/Fluids: IV fluid bolus, IV fentanyl .  Latest blood pressure reported at 105/75 but on arrival BP was 84/62.  Most recent vital signs reviewed and are as follows: BP 105/75 (BP Location: Left Arm)   Pulse (!) 50   Resp 18   SpO2 95%   Initial impression:  Dehydration in setting of poor oral intake, generalized weakness.  Given speech changes per family, will perform CT head.  3:03 PM went to recheck patient, he is currently in CT scan.  Labs personally reviewed and interpreted including: CBC shows elevated white blood cell count at 13.1, normal hemoglobin but suspect a degree of hemoconcentration CMP with sodium 130, potassium 5.7 in setting of acute kidney injury with creatinine of 2.05 with a BUN of 49, chloride low at 93, calcium  critically elevated at 13.7, mildly worsening AST, ALT and total bilirubin; lipase is mildly elevated at 78.  I have added on magnesium .  Ask for repeat EKG.  First EKG of poor quality.  Imaging personally visualized and interpreted including: Awaiting CT brain.  Reviewed pertinent lab work and imaging with patient at bedside. Questions answered.   Most current vital signs reviewed and are as follows: BP 101/82 (BP Location: Left Arm)   Pulse 99   Temp 97.9 F (36.6 C) (Oral)   Resp 20   SpO2 95%   Plan: Patient will need admission to the hospital.  He will need ongoing IV hydration.  3:20 PM Reassessment performed. Patient appears stable.  He continues to have ectopy on monitor, reviewed EKGs and previous EKGs with Dr. Mannie.  Appears similar to prior, does faster.  Will go ahead and treat hyperkalemia with Lokelma .  Will order additional fluid bolus.  Imaging personally visualized and interpreted including: CT head personally reviewed, appears negative, awaiting radiology results.  Reviewed pertinent lab work and imaging with patient and family at bedside. Questions answered.   Most current vital signs reviewed and are as follows: BP 101/82 (BP Location: Left Arm)   Pulse 99   Temp 97.9 F (36.6 C) (Oral)   Resp 20   SpO2 95%   Plan: Admission to hospital.  3:27 PM patient placed to hospitalist service.  Reviewed results of CT head, were negative.  ED ECG REPORT   Date: 10/08/2024  Rate:  119  Rhythm: sinus tachycardia, PVC pairs  QRS Axis: left  Intervals: normal  ST/T Wave abnormalities: nonspecific ST changes  Conduction Disutrbances:left anterior fascicular block  Narrative Interpretation:   Old EKG Reviewed: unchanged  I have personally reviewed the EKG tracing and agree with the computerized printout as noted.  4:11 PM Discussed case with Dr. Arthea who will see for admission.   CRITICAL CARE Performed by: Fonda Ruby PA-C Total critical care time: 40 minutes Critical care time was exclusive of separately billable procedures and treating other patients. Critical care was necessary to treat or prevent imminent or life-threatening deterioration. Critical care was time spent personally by me on the following activities: development of treatment plan with patient and/or surrogate as well as nursing, discussions with consultants, evaluation of patient's response to treatment, examination of patient, obtaining history from patient or surrogate, ordering and performing treatments and interventions, ordering and review of laboratory studies, ordering and review of radiographic studies, pulse oximetry and re-evaluation of patient's condition.                                   Medical Decision Making Amount and/or Complexity of Data Reviewed Labs: ordered. Radiology: ordered.  Risk Prescription drug management.   Patient with AKI, electrolyte derangement in setting of hepatocellular carcinoma, poor oral intake.     Final diagnoses:  Hepatocellular carcinoma (HCC)  Hypercalcemia  Hyperkalemia  Acute renal failure, unspecified acute renal failure type    ED Discharge Orders     None          Ruby Fonda, PA-C 10/08/24 1612  "

## 2024-10-08 NOTE — ED Triage Notes (Signed)
 Pt reports decrease oral intake r/t unable to keep things down, pt denies n/v/d , pt endorses liver CA , has not started treatment yet, pt c/o R. Upper and lower abd pain.

## 2024-10-08 NOTE — Telephone Encounter (Signed)
 Spoke with patient 's daughter(Valencia Claudene) regarding FMLA paperwork available for pickup.

## 2024-10-08 NOTE — Significant Event (Signed)
 Hospitalist cross cover Stat repeat potassium was ordered at 6:30 PM.  At 9:30 PM there were no results yet.  - Continue IV fluids - Awaiting repeat potassium

## 2024-10-08 NOTE — H&P (Addendum)
 " History and Physical    Patient: Todd Greer FMW:[id number] DOB: Jan 14, 1956 DOA: 10/08/2024 DOS: the patient was seen and examined on 10/08/2024 PCP: Jaycee Greig PARAS, NP  Patient coming from: Home  Chief Complaint:  Chief Complaint  Patient presents with   Dehydration   HPI: Todd Greer is a 69 y.o. male with medical history significant for recent diagnosis of hepatocellular carcinoma, recently diagnosed. Patient has not started treatment yet.  He also has a history of hepatitis C, hypertension, type 2 diabetes mellitus, nonischemic cardiomyopathy he comes in today because of progressive weakness and not being able to eat.  The family reports that he has also been intermittently confused. The patient denies nausea or vomiting or diarrhea.  He denies fevers.  He says he had a bowel movement last night. In the emergency department the patient a CT scan of his head which revealed no acute findings.  His white blood cell count was elevated at 13.1. His hemoglobin was normal at 14.7.  The patient's potassium was elevated at 5.7.  His calcium  was 13.7 his creatinine was 2.05.  A few weeks ago his creatinine was 1.4.  His sodium was slightly low at 138 and clinically the patient looked dehydrated. The hospitalist have been called to admit the patient because of his multiple electrolyte abnormalities.  He was given IV fluids and Lokelma  in the emergency department.  Review of Systems: As mentioned in the history of present illness. All other systems reviewed and are negative. 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 BID, empagliflozin  10 mg daily, spironolactone  25 mg daily.   Diabetes mellitus type II, controlled, with no complications (HCC)    Hepatitis C, chronic (HCC)    Hypertension    Nonischemic  congestive cardiomyopathy (HCC) 09/2017   Admitted for CHF: Echo EF 15 to 20% diffuse HK, severely dilated LA.  Mild RV dilation.  Mild MR.  Catheterization showed EF 25% normal EDP and normal coronaries (has been followed by Dr. 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  Diffuse HK.  Normal LVEDP.  Normal coronaries.   NM  EXERCISE MYOVIEW LTD  06/02/2013   Good exercise capacity.  Hypertensive response to exercise.  Dyspneic on exercise but no significant ST changes to suggest ischemia.  Normal stress images.  No ischemia or infarction   TRANSTHORACIC ECHOCARDIOGRAM  09/06/2017   EF 15 to 20%.  Diffuse HK.  Severe LA dilation.  Mild RV dilation.  Mild MR.   Social History:  reports that he quit smoking about 41 years ago. His smoking use included cigarettes. He started smoking about 51 years ago. He has a 5 pack-year smoking history. He has been exposed to tobacco smoke. He has never used smokeless tobacco. He reports that he does not currently use alcohol after a past usage of about 6.0 standard drinks of alcohol per week. He  reports that he does not use drugs.  Allergies[1]  Family History  Problem Relation Age of Onset   Cerebral aneurysm Mother        Died at a young age.   Stroke Father    Diabetes Father    Hypertension Father    Cancer Sister    Diabetes Paternal Grandmother    Hypertension Paternal Grandmother    Stroke Paternal Grandmother    Colon cancer Neg Hx    Rectal cancer Neg Hx    Stomach cancer Neg Hx    Esophageal cancer Neg Hx    Colon polyps Neg Hx     Prior to  Admission medications  Medication Sig Start Date End Date Taking? Authorizing Provider  atorvastatin  (LIPITOR) 20 MG tablet Take 1 tablet (20 mg total) by mouth daily. 01/21/24   Jaycee Greig PARAS, NP  Cholecalciferol  (VITAMIN D ) 50 MCG (2000 UT) CAPS Take by mouth.    [provider]  Cyanocobalamin  (VITAMIN B-12 CR) 1500 MCG TBCR Take by mouth.    [provider]  diltiazem  (CARDIZEM  CD) 240 MG 24 hr capsule Take 1 capsule (240 mg total) by mouth daily. 01/06/24   Mealor, Augustus E, MD  ENTRESTO  49-51 MG Take 1 tablet by mouth 2 (two) times daily. 01/14/24   Anner Alm ORN, MD  Magnesium  400 MG TABS Take 2 tablets by mouth. Name is Magnesium  Taurate    [provider]  Multiple Vitamin (MULTIVITAMIN) tablet Take 1 tablet by mouth daily.    [provider]  omeprazole  (PRILOSEC) 40 MG capsule Take 1 capsule (40 mg total) by mouth daily. 08/23/24   Zehr, Jessica D, PA-C  oxyCODONE  (OXY IR/ROXICODONE ) 5 MG immediate release tablet Take 1 tablet (5 mg total) by mouth every 6 (six) hours as needed for severe pain (pain score 7-10). 09/29/24   Pasam, Avinash, MD  spironolactone  (ALDACTONE ) 25 MG tablet Take 1 tablet (25 mg total) by mouth daily. 01/23/24   Tanda Bleacher, MD  sucralfate  (CARAFATE ) 1 GM/10ML suspension Take 10 mLs (1 g total) by mouth 4 (four) times daily. 08/23/24   Zehr, Jessica D, PA-C  tadalafil  (CIALIS ) 20 MG tablet Take 1 tablet (20 mg total) by mouth daily as needed. 04/21/24   Stoneking, Adine PARAS., MD  traMADol  (ULTRAM ) 50 MG tablet Take 1 tablet (50 mg total) by mouth every 8 (eight) hours as needed. 09/14/24   Cayetano Harlene BIRCH, PA-C    Physical Exam: Vitals:   10/08/24 1740 10/08/24 1759 10/08/24 1800 10/08/24 1834  BP: 107/88   (!) 99/58  Pulse: 91   85  Resp:  16 15   Temp: 97.8 F (36.6 C)   97.7 F (36.5 C)  TempSrc: Oral   Axillary  SpO2: 92%   94%  Weight: 72.3 kg     Height: 5' 7 (1.702 m)      Physical Exam:  General: No acute  distress, malnourished HEENT: Normocephalic, atraumatic, PERRL Cardiovascular: Normal rate and rhythm. Distal pulses intact. Pulmonary: Normal pulmonary effort, normal breath sounds Gastrointestinal: Nondistended abdomen, soft, tender in RUQ, normoactive bowel sounds Musculoskeletal:Normal ROM, no lower ext edema Lymphadenopathy: No cervical LAD. Skin: Skin is warm and dry. Neuro: diffusely weak AAOx3,but seemed to get lost with what he was trying to say frequently PSYCH: Attentive and cooperative  Data Reviewed:  Results for orders placed or performed during the hospital encounter of 10/08/24 (from the past 24 hours)  Lipase, blood     Status: Abnormal  Collection Time: 10/08/24  2:12 PM  Result Value Ref Range   Lipase 78 (H) 11 - 51 U/L  Comprehensive metabolic panel     Status: Abnormal   Collection Time: 10/08/24  2:12 PM  Result Value Ref Range   Sodium 130 (L) 135 - 145 mmol/L   Potassium 5.7 (H) 3.5 - 5.1 mmol/L   Chloride 93 (L) 98 - 111 mmol/L   CO2 22 22 - 32 mmol/L   Glucose, Bld 79 70 - 99 mg/dL   BUN 49 (H) 8 - 23 mg/dL   Creatinine, Ser 7.94 (H) 0.61 - 1.24 mg/dL   Calcium  13.7 (HH) 8.9 - 10.3 mg/dL   Total Protein 8.0 6.5 - 8.1 g/dL   Albumin 3.3 (L) 3.5 - 5.0 g/dL   AST 769 (H) 15 - 41 U/L   ALT 76 (H) 0 - 44 U/L   Alkaline Phosphatase 129 (H) 38 - 126 U/L   Total Bilirubin 2.6 (H) 0.0 - 1.2 mg/dL   GFR, Estimated 35 (L) >60 mL/min   Anion gap 14 5 - 15  CBC     Status: Abnormal   Collection Time: 10/08/24  2:12 PM  Result Value Ref Range   WBC 13.1 (H) 4.0 - 10.5 K/uL   RBC 5.23 4.22 - 5.81 MIL/uL   Hemoglobin 14.7 13.0 - 17.0 g/dL   HCT 54.8 60.9 - 47.9 %   MCV 86.2 80.0 - 100.0 fL   MCH 28.1 26.0 - 34.0 pg   MCHC 32.6 30.0 - 36.0 g/dL   RDW 85.2 88.4 - 84.4 %   Platelets 192 150 - 400 K/uL   nRBC 0.0 0.0 - 0.2 %  Magnesium      Status: Abnormal   Collection Time: 10/08/24  2:12 PM  Result Value Ref Range   Magnesium  2.6 (H) 1.7 - 2.4 mg/dL   Ammonia     Status: None   Collection Time: 10/08/24  2:47 PM  Result Value Ref Range   Ammonia 29 9 - 35 umol/L     Assessment and Plan: Hypercalcemia - IV fluids  - monitor.  Confusion may be due to this. Hyperkalemia-potassium equal 5.7.  Lokelma  was given in the emergency department.  Continue IV fluids. Monitor - Discontinue spironolactone  - Hold Entresto  Acute on chronic renal failure/dehydration-  - IV fluids.  This may be due to poor appetite Hepatocellular carcinoma  - Notify oncology.  He has not started treatment yet - Continue Oxy IR for pain control    Advance Care Planning:   Code Status: Full Code  The patient names his oldest daughter as a runner, broadcasting/film/video and he wants to be full code. Consults: none  Family Communication: The patient's younger daughter with whom he lives was at bedside.  Severity of Illness: The appropriate patient status for this patient is INPATIENT. Inpatient status is judged to be reasonable and necessary in order to provide the required intensity of service to ensure the patient's safety. The patient's presenting symptoms, physical exam findings, and initial radiographic and laboratory data in the context of their chronic comorbidities is felt to place them at high risk for further clinical deterioration. Furthermore, it is not anticipated that the patient will be medically stable for discharge from the hospital within 2 midnights of admission.   * I certify that at the point of admission it is my clinical judgment that the patient will require inpatient hospital care spanning beyond 2 midnights from the point of admission due  to high intensity of service, high risk for further deterioration and high frequency of surveillance required.*  Author: ARTHEA CHILD, MD 10/08/2024 9:15 PM  For on call review [email or web address].      [1]  Allergies Allergen Reactions   Amlodipine  Other (See Comments)    Fatigue   Ace Inhibitors Cough     Tolerates ARB   Lisinopril  Cough   "

## 2024-10-12 ENCOUNTER — Ambulatory Visit (HOSPITAL_COMMUNITY)

## 2024-10-14 ENCOUNTER — Inpatient Hospital Stay: Attending: Oncology

## 2024-10-14 ENCOUNTER — Inpatient Hospital Stay: Admitting: Oncology

## 2025-03-09 ENCOUNTER — Encounter: Admitting: Family
